# Patient Record
Sex: Male | Born: 1945
Health system: Southern US, Community
[De-identification: ages and names within clinical notes are randomized; demographics above are authoritative.]

## PROBLEM LIST (undated history)

## (undated) DIAGNOSIS — C189 Malignant neoplasm of colon, unspecified: Secondary | ICD-10-CM

## (undated) DIAGNOSIS — H409 Unspecified glaucoma: Secondary | ICD-10-CM

## (undated) DIAGNOSIS — I10 Essential (primary) hypertension: Secondary | ICD-10-CM

## (undated) DIAGNOSIS — R202 Paresthesia of skin: Secondary | ICD-10-CM

## (undated) DIAGNOSIS — M1990 Unspecified osteoarthritis, unspecified site: Secondary | ICD-10-CM

## (undated) DIAGNOSIS — C61 Malignant neoplasm of prostate: Secondary | ICD-10-CM

## (undated) DIAGNOSIS — G4730 Sleep apnea, unspecified: Secondary | ICD-10-CM

## (undated) DIAGNOSIS — R0602 Shortness of breath: Secondary | ICD-10-CM

## (undated) DIAGNOSIS — H269 Unspecified cataract: Secondary | ICD-10-CM

## (undated) DIAGNOSIS — J189 Pneumonia, unspecified organism: Secondary | ICD-10-CM

## (undated) DIAGNOSIS — C3490 Malignant neoplasm of unspecified part of unspecified bronchus or lung: Secondary | ICD-10-CM

## (undated) DIAGNOSIS — K219 Gastro-esophageal reflux disease without esophagitis: Secondary | ICD-10-CM

## (undated) DIAGNOSIS — M79609 Pain in unspecified limb: Secondary | ICD-10-CM

## (undated) DIAGNOSIS — J449 Chronic obstructive pulmonary disease, unspecified: Secondary | ICD-10-CM

## (undated) DIAGNOSIS — N189 Chronic kidney disease, unspecified: Secondary | ICD-10-CM

## (undated) DIAGNOSIS — C801 Malignant (primary) neoplasm, unspecified: Secondary | ICD-10-CM

## (undated) DIAGNOSIS — N529 Male erectile dysfunction, unspecified: Secondary | ICD-10-CM

## (undated) HISTORY — PX: ELBOW SURGERY: SHX618

## (undated) HISTORY — PX: BACK SURGERY: SHX140

## (undated) HISTORY — PX: EYE SURGERY: SHX253

---

## 1998-03-10 ENCOUNTER — Inpatient Hospital Stay (HOSPITAL_COMMUNITY): Admission: EM | Admit: 1998-03-10 | Discharge: 1998-03-12 | Payer: Self-pay | Admitting: Emergency Medicine

## 1998-07-02 ENCOUNTER — Encounter: Payer: Self-pay | Admitting: Ophthalmology

## 1998-07-03 ENCOUNTER — Inpatient Hospital Stay (HOSPITAL_COMMUNITY): Admission: RE | Admit: 1998-07-03 | Discharge: 1998-07-04 | Payer: Self-pay | Admitting: Ophthalmology

## 1999-02-14 HISTORY — PX: APPENDECTOMY: SHX54

## 2000-04-27 ENCOUNTER — Ambulatory Visit (HOSPITAL_COMMUNITY): Admission: RE | Admit: 2000-04-27 | Discharge: 2000-04-27 | Payer: Self-pay | Admitting: Ophthalmology

## 2000-04-27 ENCOUNTER — Encounter: Payer: Self-pay | Admitting: Ophthalmology

## 2002-11-14 ENCOUNTER — Ambulatory Visit (HOSPITAL_COMMUNITY): Admission: RE | Admit: 2002-11-14 | Discharge: 2002-11-14 | Payer: Self-pay | Admitting: Internal Medicine

## 2003-04-06 ENCOUNTER — Encounter (HOSPITAL_COMMUNITY): Admission: RE | Admit: 2003-04-06 | Discharge: 2003-05-06 | Payer: Self-pay | Admitting: Internal Medicine

## 2003-04-07 ENCOUNTER — Encounter: Payer: Self-pay | Admitting: Internal Medicine

## 2004-02-24 ENCOUNTER — Emergency Department (HOSPITAL_COMMUNITY): Admission: EM | Admit: 2004-02-24 | Discharge: 2004-02-24 | Payer: Self-pay | Admitting: Emergency Medicine

## 2004-03-08 ENCOUNTER — Inpatient Hospital Stay (HOSPITAL_BASED_OUTPATIENT_CLINIC_OR_DEPARTMENT_OTHER): Admission: RE | Admit: 2004-03-08 | Discharge: 2004-03-08 | Payer: Self-pay | Admitting: Cardiology

## 2004-04-26 ENCOUNTER — Ambulatory Visit (HOSPITAL_COMMUNITY): Admission: RE | Admit: 2004-04-26 | Discharge: 2004-04-26 | Payer: Self-pay | Admitting: Internal Medicine

## 2006-09-15 HISTORY — PX: CARDIAC CATHETERIZATION: SHX172

## 2007-12-11 ENCOUNTER — Ambulatory Visit (HOSPITAL_COMMUNITY): Admission: RE | Admit: 2007-12-11 | Discharge: 2007-12-11 | Payer: Self-pay | Admitting: Urology

## 2009-09-15 DIAGNOSIS — J189 Pneumonia, unspecified organism: Secondary | ICD-10-CM

## 2009-09-15 HISTORY — DX: Pneumonia, unspecified organism: J18.9

## 2010-02-20 ENCOUNTER — Ambulatory Visit (HOSPITAL_COMMUNITY): Admission: RE | Admit: 2010-02-20 | Discharge: 2010-02-20 | Payer: Self-pay | Admitting: Internal Medicine

## 2010-02-22 ENCOUNTER — Ambulatory Visit (HOSPITAL_COMMUNITY): Admission: RE | Admit: 2010-02-22 | Discharge: 2010-02-22 | Payer: Self-pay | Admitting: Internal Medicine

## 2010-02-27 ENCOUNTER — Ambulatory Visit: Payer: Self-pay | Admitting: Thoracic Surgery

## 2010-03-01 ENCOUNTER — Ambulatory Visit (HOSPITAL_COMMUNITY): Admission: RE | Admit: 2010-03-01 | Discharge: 2010-03-01 | Payer: Self-pay | Admitting: Thoracic Surgery

## 2010-03-01 ENCOUNTER — Ambulatory Visit: Payer: Self-pay | Admitting: Thoracic Surgery

## 2010-03-05 ENCOUNTER — Ambulatory Visit (HOSPITAL_COMMUNITY): Admission: RE | Admit: 2010-03-05 | Discharge: 2010-03-05 | Payer: Self-pay | Admitting: Thoracic Surgery

## 2010-03-06 ENCOUNTER — Ambulatory Visit: Payer: Self-pay | Admitting: Thoracic Surgery

## 2010-03-14 ENCOUNTER — Ambulatory Visit: Payer: Self-pay | Admitting: Cardiothoracic Surgery

## 2010-03-15 HISTORY — PX: OTHER SURGICAL HISTORY: SHX169

## 2010-03-20 ENCOUNTER — Encounter: Payer: Self-pay | Admitting: Cardiothoracic Surgery

## 2010-03-20 ENCOUNTER — Inpatient Hospital Stay (HOSPITAL_COMMUNITY): Admission: RE | Admit: 2010-03-20 | Discharge: 2010-03-26 | Payer: Self-pay | Admitting: Thoracic Surgery

## 2010-03-25 ENCOUNTER — Ambulatory Visit: Payer: Self-pay | Admitting: Cardiothoracic Surgery

## 2010-03-29 ENCOUNTER — Ambulatory Visit: Payer: Self-pay | Admitting: Cardiothoracic Surgery

## 2010-04-11 ENCOUNTER — Ambulatory Visit: Payer: Self-pay | Admitting: Cardiothoracic Surgery

## 2010-04-11 ENCOUNTER — Encounter: Admission: RE | Admit: 2010-04-11 | Discharge: 2010-04-11 | Payer: Self-pay | Admitting: Cardiothoracic Surgery

## 2010-04-12 ENCOUNTER — Ambulatory Visit: Payer: Self-pay | Admitting: Internal Medicine

## 2010-04-22 LAB — CBC WITH DIFFERENTIAL/PLATELET
EOS%: 2.7 % (ref 0.0–7.0)
LYMPH%: 34.6 % (ref 14.0–49.0)
MCH: 31.9 pg (ref 27.2–33.4)
MCV: 91.5 fL (ref 79.3–98.0)
MONO#: 0.7 10*3/uL (ref 0.1–0.9)
MONO%: 8.9 % (ref 0.0–14.0)
NEUT#: 4.3 10*3/uL (ref 1.5–6.5)
NEUT%: 53 % (ref 39.0–75.0)
WBC: 8.2 10*3/uL (ref 4.0–10.3)

## 2010-04-22 LAB — COMPREHENSIVE METABOLIC PANEL
AST: 23 U/L (ref 0–37)
Albumin: 4.6 g/dL (ref 3.5–5.2)
Alkaline Phosphatase: 94 U/L (ref 39–117)
BUN: 23 mg/dL (ref 6–23)
Calcium: 9.6 mg/dL (ref 8.4–10.5)
Chloride: 103 mEq/L (ref 96–112)
Potassium: 4.4 mEq/L (ref 3.5–5.3)
Sodium: 138 mEq/L (ref 135–145)
Total Bilirubin: 0.4 mg/dL (ref 0.3–1.2)

## 2010-07-11 ENCOUNTER — Encounter: Admission: RE | Admit: 2010-07-11 | Discharge: 2010-07-11 | Payer: Self-pay | Admitting: Cardiothoracic Surgery

## 2010-07-11 ENCOUNTER — Ambulatory Visit: Payer: Self-pay | Admitting: Cardiothoracic Surgery

## 2010-07-17 ENCOUNTER — Ambulatory Visit: Payer: Self-pay | Admitting: Internal Medicine

## 2010-07-19 ENCOUNTER — Ambulatory Visit (HOSPITAL_COMMUNITY): Admission: RE | Admit: 2010-07-19 | Discharge: 2010-07-19 | Payer: Self-pay | Admitting: Internal Medicine

## 2010-07-19 LAB — COMPREHENSIVE METABOLIC PANEL
ALT: 23 U/L (ref 0–53)
AST: 23 U/L (ref 0–37)
Alkaline Phosphatase: 87 U/L (ref 39–117)
BUN: 31 mg/dL — ABNORMAL HIGH (ref 6–23)
Calcium: 9.4 mg/dL (ref 8.4–10.5)
Chloride: 110 mEq/L (ref 96–112)
Creatinine, Ser: 1.09 mg/dL (ref 0.40–1.50)
Potassium: 4.5 mEq/L (ref 3.5–5.3)
Sodium: 141 mEq/L (ref 135–145)
Total Bilirubin: 0.3 mg/dL (ref 0.3–1.2)

## 2010-07-19 LAB — CBC WITH DIFFERENTIAL/PLATELET
LYMPH%: 35.3 % (ref 14.0–49.0)
MONO#: 0.7 10*3/uL (ref 0.1–0.9)
NEUT%: 48.6 % (ref 39.0–75.0)
Platelets: 324 10*3/uL (ref 140–400)
RBC: 4.15 10*6/uL — ABNORMAL LOW (ref 4.20–5.82)
RDW: 12.9 % (ref 11.0–14.6)
WBC: 6.7 10*3/uL (ref 4.0–10.3)

## 2010-08-15 HISTORY — PX: PROSTATECTOMY: SHX69

## 2010-09-12 ENCOUNTER — Inpatient Hospital Stay (HOSPITAL_COMMUNITY)
Admission: RE | Admit: 2010-09-12 | Discharge: 2010-09-13 | Payer: Self-pay | Source: Home / Self Care | Attending: Urology | Admitting: Urology

## 2010-09-12 ENCOUNTER — Encounter: Payer: Self-pay | Admitting: Urology

## 2010-10-04 ENCOUNTER — Ambulatory Visit
Admission: RE | Admit: 2010-10-04 | Discharge: 2010-10-04 | Payer: Self-pay | Source: Home / Self Care | Attending: Cardiothoracic Surgery | Admitting: Cardiothoracic Surgery

## 2010-10-04 ENCOUNTER — Encounter
Admission: RE | Admit: 2010-10-04 | Discharge: 2010-10-04 | Payer: Self-pay | Source: Home / Self Care | Attending: Cardiothoracic Surgery | Admitting: Cardiothoracic Surgery

## 2010-10-04 ENCOUNTER — Other Ambulatory Visit: Payer: Self-pay | Admitting: Internal Medicine

## 2010-10-04 DIAGNOSIS — C3490 Malignant neoplasm of unspecified part of unspecified bronchus or lung: Secondary | ICD-10-CM

## 2010-10-05 NOTE — Assessment & Plan Note (Signed)
OFFICE VISIT  Dutt, Tajee T DOB:  04-14-1946                                        October 04, 2010 CHART #:  [id number]  HISTORY:  The patient returns to the office today in followup after a left video-assisted thoracoscopy and left upper lobectomy for a squamous cell carcinoma.  At the time of surgery, it was a T2N0M0 carcinoma of the left upper lobe.  He notes that he has been doing very well.  Since last seen, he has underwent prostatectomy for carcinoma of the prostate, biopsy proven, interesting that his PET scan done 6 months ago did not show any activity in the prostate; this was done 3 weeks ago.  He notes that he is doing well and just started driving this week.  He has had no further pulmonary problems.  PHYSICAL EXAMINATION:  His blood pressure 149/91, pulse 84, respiratory rate 20, and O2 sats 96%.  His left chest tube sites incisions are well healed.  His lungs are clear bilaterally.  DIAGNOSTIC TESTS:  Followup chest x-ray shows clear lung fields bilaterally.  I have reviewed the findings of a CT scan done by Dr. Arbutus Ped last month.  PLAN:  He has a followup scan plan for April of this year.  I plan to see him back in approximately 6 months.  He is to see Dr. Arbutus Ped in April after the CT scan.  Sheliah Plane, MD Electronically Signed  EG/MEDQ  D:  10/04/2010  T:  10/05/2010  Job:  [id number]  cc:   Kingsley Callander. Ouida Sills, MD Lajuana Matte, MD

## 2010-10-16 ENCOUNTER — Encounter: Payer: Self-pay | Admitting: Cardiothoracic Surgery

## 2010-11-25 LAB — TYPE AND SCREEN
ABO/RH(D): O POS
Antibody Screen: NEGATIVE

## 2010-11-25 LAB — BASIC METABOLIC PANEL
BUN: 29 mg/dL — ABNORMAL HIGH (ref 6–23)
CO2: 24 mEq/L (ref 19–32)
Chloride: 109 mEq/L (ref 96–112)
Creatinine, Ser: 1 mg/dL (ref 0.4–1.5)
GFR calc Af Amer: >60 mL/min (ref >60)
Glucose, Bld: 97 mg/dL (ref 70–99)
Potassium: 4.4 mEq/L (ref 3.5–5.1)
Sodium: 141 mEq/L (ref 135–145)

## 2010-11-25 LAB — HEMOGLOBIN AND HEMATOCRIT, BLOOD
HCT: 35.5 % — ABNORMAL LOW (ref 39.0–52.0)
Hemoglobin: 12.1 g/dL — ABNORMAL LOW (ref 13.0–17.0)

## 2010-11-25 LAB — ABO/RH: ABO/RH(D): O POS

## 2010-11-25 LAB — CBC
MCHC: 34.7 g/dL (ref 30.0–36.0)
Platelets: 303 10*3/uL (ref 150–400)
RBC: 4.37 MIL/uL (ref 4.22–5.81)
RDW: 13 % (ref 11.5–15.5)
WBC: 6.1 10*3/uL (ref 4.0–10.5)

## 2010-12-01 LAB — COMPREHENSIVE METABOLIC PANEL
ALT: 19 U/L (ref 0–53)
AST: 21 U/L (ref 0–37)
Albumin: 2.4 g/dL — ABNORMAL LOW (ref 3.5–5.2)
Alkaline Phosphatase: 104 U/L (ref 39–117)
Alkaline Phosphatase: 53 U/L (ref 39–117)
BUN: 31 mg/dL — ABNORMAL HIGH (ref 6–23)
BUN: <1 mg/dL — ABNORMAL LOW (ref 6–23)
CO2: 32 mEq/L (ref 19–32)
CO2: 25 mEq/L (ref 19–32)
Calcium: 8 mg/dL — ABNORMAL LOW (ref 8.4–10.5)
Calcium: 9.6 mg/dL (ref 8.4–10.5)
Chloride: 109 mEq/L (ref 96–112)
Chloride: 97 mEq/L (ref 96–112)
Creatinine, Ser: 0.46 mg/dL (ref 0.4–1.5)
Creatinine, Ser: 1.06 mg/dL (ref 0.4–1.5)
GFR calc Af Amer: >60 mL/min (ref >60)
GFR calc non Af Amer: >60 mL/min (ref >60)
GFR calc non Af Amer: >60 mL/min (ref >60)
GFR calc non Af Amer: >60 mL/min (ref >60)
Glucose, Bld: 104 mg/dL — ABNORMAL HIGH (ref 70–99)
Glucose, Bld: 99 mg/dL (ref 70–99)
Glucose, Bld: 94 mg/dL (ref 70–99)
Potassium: 3.9 mEq/L (ref 3.5–5.1)
Potassium: 4.4 mEq/L (ref 3.5–5.1)
Sodium: 135 mEq/L (ref 135–145)
Sodium: 140 mEq/L (ref 135–145)
Total Bilirubin: 0.2 mg/dL — ABNORMAL LOW (ref 0.3–1.2)
Total Bilirubin: 0.5 mg/dL (ref 0.3–1.2)
Total Protein: 5 g/dL — ABNORMAL LOW (ref 6.0–8.3)
Total Protein: 7.5 g/dL (ref 6.0–8.3)

## 2010-12-01 LAB — CBC
HCT: 23.5 % — ABNORMAL LOW (ref 39.0–52.0)
HCT: 32.8 % — ABNORMAL LOW (ref 39.0–52.0)
HCT: 33.5 % — ABNORMAL LOW (ref 39.0–52.0)
HCT: 40.2 % (ref 39.0–52.0)
Hemoglobin: 11.1 g/dL — ABNORMAL LOW (ref 13.0–17.0)
Hemoglobin: 11.6 g/dL — ABNORMAL LOW (ref 13.0–17.0)
Hemoglobin: 8 g/dL — ABNORMAL LOW (ref 13.0–17.0)
Hemoglobin: 11.7 g/dL — ABNORMAL LOW (ref 13.0–17.0)
MCH: 31.6 pg (ref 26.0–34.0)
MCH: 31.9 pg (ref 26.0–34.0)
MCH: 33.2 pg (ref 26.0–34.0)
MCHC: 33.9 g/dL (ref 30.0–36.0)
MCHC: 34.5 g/dL (ref 30.0–36.0)
MCHC: 34.6 g/dL (ref 30.0–36.0)
MCHC: 34.7 g/dL (ref 30.0–36.0)
MCV: 91.9 fL (ref 78.0–100.0)
MCV: 92.3 fL (ref 78.0–100.0)
MCV: 93.2 fL (ref 78.0–100.0)
MCV: 95.8 fL (ref 78.0–100.0)
MCV: 91.9 fL (ref 78.0–100.0)
Platelets: 166 10*3/uL (ref 150–400)
Platelets: 180 10*3/uL (ref 150–400)
Platelets: 188 10*3/uL (ref 150–400)
RBC: 2.46 MIL/uL — ABNORMAL LOW (ref 4.22–5.81)
RBC: 3.52 MIL/uL — ABNORMAL LOW (ref 4.22–5.81)
RBC: 3.63 MIL/uL — ABNORMAL LOW (ref 4.22–5.81)
RBC: 3.66 MIL/uL — ABNORMAL LOW (ref 4.22–5.81)
RDW: 12.2 % (ref 11.5–15.5)
RDW: 15.1 % (ref 11.5–15.5)
RDW: 15.2 % (ref 11.5–15.5)
RDW: 15.3 % (ref 11.5–15.5)
RDW: 13.9 % (ref 11.5–15.5)
WBC: 10 10*3/uL (ref 4.0–10.5)
WBC: 14.1 10*3/uL — ABNORMAL HIGH (ref 4.0–10.5)
WBC: 16 10*3/uL — ABNORMAL HIGH (ref 4.0–10.5)
WBC: 9.2 10*3/uL (ref 4.0–10.5)

## 2010-12-01 LAB — TYPE AND SCREEN
ABO/RH(D): O POS
Antibody Screen: NEGATIVE

## 2010-12-01 LAB — BASIC METABOLIC PANEL
BUN: 15 mg/dL (ref 6–23)
BUN: 19 mg/dL (ref 6–23)
CO2: 23 mEq/L (ref 19–32)
CO2: 27 mEq/L (ref 19–32)
Calcium: 8.1 mg/dL — ABNORMAL LOW (ref 8.4–10.5)
Calcium: 8.7 mg/dL (ref 8.4–10.5)
Chloride: 101 mEq/L (ref 96–112)
Chloride: 99 mEq/L (ref 96–112)
Creatinine, Ser: 0.88 mg/dL (ref 0.4–1.5)
Creatinine, Ser: 0.91 mg/dL (ref 0.4–1.5)
GFR calc Af Amer: >60 mL/min (ref >60)
GFR calc Af Amer: >60 mL/min (ref >60)
GFR calc non Af Amer: >60 mL/min (ref >60)
GFR calc non Af Amer: >60 mL/min (ref >60)
Glucose, Bld: 116 mg/dL — ABNORMAL HIGH (ref 70–99)
Glucose, Bld: 124 mg/dL — ABNORMAL HIGH (ref 70–99)
Potassium: 4 mEq/L (ref 3.5–5.1)
Potassium: 4.1 mEq/L (ref 3.5–5.1)
Sodium: 132 mEq/L — ABNORMAL LOW (ref 135–145)
Sodium: 133 mEq/L — ABNORMAL LOW (ref 135–145)

## 2010-12-01 LAB — POCT I-STAT 3, ART BLOOD GAS (G3+)
Bicarbonate: 23.3 mEq/L (ref 20.0–24.0)
Patient temperature: 98.5
TCO2: 24 mmol/L (ref 0–100)
pH, Arterial: 7.395 (ref 7.350–7.450)
pO2, Arterial: 69 mmHg — ABNORMAL LOW (ref 80.0–100.0)

## 2010-12-01 LAB — AFB CULTURE WITH SMEAR (NOT AT ARMC)

## 2010-12-01 LAB — CULTURE, RESPIRATORY W GRAM STAIN: Culture: NO GROWTH

## 2010-12-01 LAB — BLOOD GAS, ARTERIAL
Bicarbonate: 19.3 mEq/L — ABNORMAL LOW (ref 20.0–24.0)
Patient temperature: 98.6
TCO2: 20.2 mmol/L (ref 0–100)
pH, Arterial: 7.433 (ref 7.350–7.450)

## 2010-12-01 LAB — GLUCOSE, CAPILLARY
Glucose-Capillary: 114 mg/dL — ABNORMAL HIGH (ref 70–99)
Glucose-Capillary: 104 mg/dL — ABNORMAL HIGH (ref 70–99)

## 2010-12-01 LAB — FUNGUS CULTURE W SMEAR

## 2010-12-01 LAB — URINALYSIS, ROUTINE W REFLEX MICROSCOPIC
Glucose, UA: NEGATIVE mg/dL
Hgb urine dipstick: NEGATIVE
Specific Gravity, Urine: 1.011 (ref 1.005–1.030)
Urobilinogen, UA: 0.2 mg/dL (ref 0.0–1.0)

## 2010-12-01 LAB — PROTIME-INR
INR: 0.94 (ref 0.00–1.49)
INR: 1 (ref 0.00–1.49)
Prothrombin Time: 12.5 seconds (ref 11.6–15.2)
Prothrombin Time: 13.1 seconds (ref 11.6–15.2)

## 2010-12-01 LAB — APTT: aPTT: 30 seconds (ref 24–37)

## 2011-01-17 ENCOUNTER — Other Ambulatory Visit: Payer: Self-pay | Admitting: Internal Medicine

## 2011-01-17 ENCOUNTER — Encounter (HOSPITAL_BASED_OUTPATIENT_CLINIC_OR_DEPARTMENT_OTHER): Payer: BC Managed Care – PPO | Admitting: Internal Medicine

## 2011-01-17 ENCOUNTER — Encounter (HOSPITAL_COMMUNITY): Payer: Self-pay

## 2011-01-17 ENCOUNTER — Ambulatory Visit (HOSPITAL_COMMUNITY)
Admission: RE | Admit: 2011-01-17 | Discharge: 2011-01-17 | Disposition: A | Discharge status: Home / Self Care | Payer: BC Managed Care – PPO | Source: Ambulatory Visit | Attending: Internal Medicine | Admitting: Internal Medicine

## 2011-01-17 DIAGNOSIS — C3410 Malignant neoplasm of upper lobe, unspecified bronchus or lung: Secondary | ICD-10-CM

## 2011-01-17 DIAGNOSIS — Z8546 Personal history of malignant neoplasm of prostate: Secondary | ICD-10-CM | POA: Insufficient documentation

## 2011-01-17 DIAGNOSIS — Z902 Acquired absence of lung [part of]: Secondary | ICD-10-CM | POA: Insufficient documentation

## 2011-01-17 DIAGNOSIS — C3490 Malignant neoplasm of unspecified part of unspecified bronchus or lung: Secondary | ICD-10-CM

## 2011-01-17 DIAGNOSIS — I2510 Atherosclerotic heart disease of native coronary artery without angina pectoris: Secondary | ICD-10-CM | POA: Insufficient documentation

## 2011-01-17 DIAGNOSIS — J984 Other disorders of lung: Secondary | ICD-10-CM | POA: Insufficient documentation

## 2011-01-17 DIAGNOSIS — Z9079 Acquired absence of other genital organ(s): Secondary | ICD-10-CM | POA: Insufficient documentation

## 2011-01-17 HISTORY — DX: Malignant neoplasm of prostate: C61

## 2011-01-17 HISTORY — DX: Essential (primary) hypertension: I10

## 2011-01-17 HISTORY — DX: Malignant neoplasm of unspecified part of unspecified bronchus or lung: C34.90

## 2011-01-17 LAB — CMP (CANCER CENTER ONLY)
ALT(SGPT): 25 U/L (ref 10–47)
Albumin: 3.9 g/dL (ref 3.3–5.5)
CO2: 23 mEq/L (ref 18–33)
Calcium: 9.2 mg/dL (ref 8.0–10.3)
Chloride: 106 mEq/L (ref 98–108)
Glucose, Bld: 112 mg/dL (ref 73–118)
Sodium: 143 mEq/L (ref 128–145)
Total Protein: 7.5 g/dL (ref 6.4–8.1)

## 2011-01-17 LAB — CBC WITH DIFFERENTIAL/PLATELET
BASO%: 0.7 % (ref 0.0–2.0)
Eosinophils Absolute: 0.2 10*3/uL (ref 0.0–0.5)
HCT: 37.6 % — ABNORMAL LOW (ref 38.4–49.9)
MCHC: 34.8 g/dL (ref 32.0–36.0)
MONO#: 0.6 10*3/uL (ref 0.1–0.9)
NEUT#: 3.8 10*3/uL (ref 1.5–6.5)
Platelets: 280 10*3/uL (ref 140–400)
RBC: 4.1 10*6/uL — ABNORMAL LOW (ref 4.20–5.82)
WBC: 6.7 10*3/uL (ref 4.0–10.3)
lymph#: 2.1 10*3/uL (ref 0.9–3.3)

## 2011-01-17 MED ORDER — IOHEXOL 300 MG/ML  SOLN
80.0000 mL | Freq: Once | INTRAMUSCULAR | Status: AC | PRN
  Administered 2011-01-17: 80 mL via INTRAVENOUS

## 2011-01-21 ENCOUNTER — Other Ambulatory Visit: Payer: Self-pay | Admitting: Internal Medicine

## 2011-01-21 ENCOUNTER — Encounter (HOSPITAL_BASED_OUTPATIENT_CLINIC_OR_DEPARTMENT_OTHER): Payer: BC Managed Care – PPO | Admitting: Internal Medicine

## 2011-01-21 DIAGNOSIS — I10 Essential (primary) hypertension: Secondary | ICD-10-CM

## 2011-01-21 DIAGNOSIS — C3490 Malignant neoplasm of unspecified part of unspecified bronchus or lung: Secondary | ICD-10-CM

## 2011-01-21 DIAGNOSIS — C3410 Malignant neoplasm of upper lobe, unspecified bronchus or lung: Secondary | ICD-10-CM

## 2011-01-21 DIAGNOSIS — C6990 Malignant neoplasm of unspecified site of unspecified eye: Secondary | ICD-10-CM

## 2011-01-21 DIAGNOSIS — Z85118 Personal history of other malignant neoplasm of bronchus and lung: Secondary | ICD-10-CM

## 2011-01-27 ENCOUNTER — Encounter (HOSPITAL_COMMUNITY)
Admission: RE | Admit: 2011-01-27 | Discharge: 2011-01-27 | Disposition: A | Discharge status: Home / Self Care | Payer: BC Managed Care – PPO | Source: Ambulatory Visit | Attending: Internal Medicine | Admitting: Internal Medicine

## 2011-01-27 DIAGNOSIS — J984 Other disorders of lung: Secondary | ICD-10-CM | POA: Insufficient documentation

## 2011-01-27 DIAGNOSIS — C3490 Malignant neoplasm of unspecified part of unspecified bronchus or lung: Secondary | ICD-10-CM | POA: Insufficient documentation

## 2011-01-27 DIAGNOSIS — Z85118 Personal history of other malignant neoplasm of bronchus and lung: Secondary | ICD-10-CM

## 2011-01-27 LAB — GLUCOSE, CAPILLARY: Glucose-Capillary: 97 mg/dL (ref 70–99)

## 2011-01-27 MED ORDER — FLUDEOXYGLUCOSE F - 18 (FDG) INJECTION: 14.6000 | Freq: Once | INTRAVENOUS | Status: AC | PRN

## 2011-01-28 ENCOUNTER — Other Ambulatory Visit: Payer: Self-pay | Admitting: Internal Medicine

## 2011-01-28 ENCOUNTER — Encounter (HOSPITAL_BASED_OUTPATIENT_CLINIC_OR_DEPARTMENT_OTHER): Payer: BC Managed Care – PPO | Admitting: Internal Medicine

## 2011-01-28 DIAGNOSIS — C3490 Malignant neoplasm of unspecified part of unspecified bronchus or lung: Secondary | ICD-10-CM

## 2011-01-28 DIAGNOSIS — C3410 Malignant neoplasm of upper lobe, unspecified bronchus or lung: Secondary | ICD-10-CM

## 2011-01-28 DIAGNOSIS — I10 Essential (primary) hypertension: Secondary | ICD-10-CM

## 2011-01-28 DIAGNOSIS — C6990 Malignant neoplasm of unspecified site of unspecified eye: Secondary | ICD-10-CM

## 2011-01-28 NOTE — Letter (Signed)
March 06, 2010   Kingsley Callander. Ouida Sills, MD  [address]  [place], [place] [address]   Re:  [name] Short, [name] Short                 DOB:  07-Apr-1946   Dear Channing Mutters,   I saw the patient in the office today after we done a PET scan, CT scan  of the brain, bronchoscopy, and pulmonary function tests.  His pulmonary  function test was satisfactory for surgery.  His brain scan was negative  for metastatic disease.  His PET scan was positive in his left lingular  lesion.  The biopsies revealed squamous cell cancer or non-small cell  lung cancer.  I have recommended that he get a left upper lobectomy and  if that can be done, I do not think we can definitely remove it with a  left pneumonectomy.  I have explained the risk of both the lobectomy and  the pneumonectomy to him and he and his family agrees to the surgery.  We will do this on March 15, 2010, at [facility].   Ines Bloomer, M.D.  Electronically Signed   DPB/MEDQ  D:  03/06/2010  Short:  03/07/2010  Job:  [id number]

## 2011-01-28 NOTE — Assessment & Plan Note (Signed)
OFFICE VISIT   [name] Short, [name] Short                                        April 11, 2010  CHART #:  [id number]   HISTORY:  The patient returns to the office today in followup after his  recent left mini thoracotomy and left upper lobectomy with lymph node  dissection.  At the time of surgery, he was found to have a moderately  differentiated squamous cell carcinoma of the lung with negative nodes.  The tumor itself was 2.3 cm in gross size.  At the time of surgery, the  mass appeared to be between 2 and 3 centimeters from the distal to the  carina, staging as a pT2 N0 M0 carcinoma of the lung.  The patient did  extremely well postoperatively.  He is active without any shortness of  breath.  His wounds have healed well.   PHYSICAL EXAMINATION:  His blood pressure 122/80, pulse 56, respiratory  rate 16, and O2 sats 98% on room air.  His lungs are clear bilaterally.  The left chest incisions are all healing well.   DIAGNOSTIC TESTS:  Followup chest x-ray shows clear lung fields  bilaterally with good inflation.  There is no significant air space or  pneumothorax.   MEDICATIONS:  The patient continues on Alphagan eye drops.  He has  resumed his Cozaar.  He continues on nifedipine and losartan.  He was  discharged home on Lopressor 25 twice a day.  He is interested in  getting off this.  He will continue another week and then decrease it to  once a day for a week and then discontinue it.   IMPRESSION:  Overall, I am very pleased with the patient's progress.  We  have made him referral appointment for his case to be reviewed by  Medical Oncology.  The office will make  him an appointment for followup with Dr. Arbutus Ped.  I will plan to see  him back again in 3 months with a followup chest x-ray.   Sheliah Plane, MD  Electronically Signed   EG/MEDQ  D:  04/11/2010  Short:  04/12/2010  Job:  [id number]   cc:   Kingsley Callander. Ouida Sills, MD

## 2011-01-28 NOTE — Letter (Signed)
February 27, 2010   [name] Short. Ouida Sills, MD  [address]  [place], [place] [address]   Re:  [name] Short, [name] Short                 DOB:  20-Jan-1946   Dear Dr. Ouida Sills:   I appreciate the opportunity of seeing the patient.  This 65 year old  patient was treated for pneumonia and then a followup CT scan showed a  large left upper lobe mass with hilar and mediastinal adenopathy.  He  also had some fractures of his right posterior tenth ribs and has a  question of a sclerotic kyphosis at T7.  He has had no hemoptysis.  He  has had some fever with temps of 102 and was treated with Avelox.  He  quit smoking in December 2009.   PAST MEDICAL HISTORY:  Significant for hypertension.   MEDICATIONS:  Cozaar and Procardia.   ALLERGIES:  As mentioned, there are no allergies.   FAMILY HISTORY:  Noncontributory.   SOCIAL HISTORY:  He is married.  He has 3 children.  He works as an  Personnel officer.  Quit smoking in December 2009.   REVIEW OF SYSTEMS:  CONSTITUTIONAL:  He is 163 pounds.  He is 5 feet 7  inches.  GENERAL:  His weight has been stable.  CARDIAC:  No angina or atrial fibrillation.  PULMONARY:  No bronchitis or asthma or wheezing.  GI:  Reflux.  GU:  No kidney disease, dysuria, or frequent urination.  VASCULAR:  No claudication, DVT, TIAs.  NEUROLOGIC:  No dizziness, headaches, blackouts, or seizures.  MUSCULOSKELETAL:  Arthritis and joint pain.  PSYCHIATRIC:  No psychiatric illness.  EYE/ENT:  No change in his eyesight or hearing.  HEMATOLOGIC:  No problems with bleeding, clotting disorders, or anemia.   PHYSICAL EXAMINATION:  General:  He is a well-developed Caucasian male.  Vital Signs:  Blood pressure 167/92, pulse 64, respirations 18, sats  were 98%.  Head, Eyes, Ears, Nose, and Throat:  Unremarkable.  Neck:  Supple without thyromegaly.  Chest:  Clear to auscultation and  percussion.  Heart:  Regular sinus rhythm.  No murmurs.  Abdomen:  Soft.  There is no splenomegaly.  Extremities:   Pulses are 2+.  There is no  clubbing or edema.  Neurologic:  He is oriented x3.  Sensory and motor  intact.  Cranial nerves intact.   ASSESSMENT:  I feel that he probably has a stage IIIA non-small cell  lung cancer, it could possibly be small cell lung cancer.   PLAN:  Do a bronchoscopy with endobronchial ultrasound, possible  mediastinoscopy on March 01, 2010.   I appreciate the opportunity of seeing the patient.   Sincerely,   [name] Short, M.D.  Electronically Signed   DPB/MEDQ  D:  02/27/2010  Short:  02/28/2010  Job:  [id number]

## 2011-01-28 NOTE — Assessment & Plan Note (Signed)
OFFICE VISIT   Cohron, [name] Short  DOB:  1946/05/19                                        July 11, 2010  CHART #:  [id number]   HISTORY:  The patient returns to the office today in followup after his  resection of a squamous cell carcinoma, moderately differentiated, 2.3  cm in size with negative nodes, ultimately staged pathologically as a  T2N0M0 carcinoma of the left lung with left upper lobectomy done March 20, 2010.  Postoperatively, the patient has done very well.  He notes only  very slight shortness of breath with extreme exertion.  He has now  returned to full-time work that is fairly physically demanding.  He has  had no hemoptysis.  He notes that he is up-to-date on his flu  vaccination.  He has not had a pneumococcal vaccination.  He is  currently 16.   PHYSICAL EXAMINATION:  His blood pressure is 159/93, pulse is 61,  respirations 18, and O2 sats 98%.  His lungs are clear bilaterally.  I  do not appreciate any cervical or supraclavicular adenopathy or axillary  adenopathy.  His cardiac exam reveals regular rate and rhythm without  murmur or gallop.  His chest tube sites were all well healed without any  palpable masses.  His abdominal exam is benign.  I do not appreciate any  organomegaly or enlarged aorta.  He has no calf tenderness in his lower  extremities.   DIAGNOSTIC TESTS:  His chest x-ray today shows postoperative changes,  but otherwise clear.   IMPRESSION:  Overall, he appears to be making very good progress, now  about 3 months postop.  He has an appointment to see Dr. Arbutus Ped next  week for a followup CT scan of the  chest.  I have made him an appointment to see me in 3 months to stagger  visits to me and Dr. Arbutus Ped every 6 months.  Overall, I am very pleased  with his progress.   Sheliah Plane, MD  Electronically Signed   EG/MEDQ  D:  07/11/2010  Short:  07/11/2010  Job:  [id number]   cc:   Lajuana Matte, MD  Kingsley Callander. Ouida Sills,  MD

## 2011-01-31 NOTE — Op Note (Signed)
   NAME:  [name] Short, [name] Short                           ACCOUNT NO.:  [id number]   MEDICAL RECORD NO.:  [id number]                   PATIENT TYPE:  AMB   LOCATION:  DAY                                  FACILITY:  [facility]   PHYSICIAN:  Lionel December, M.D.                 DATE OF BIRTH:  1945-12-28   DATE OF PROCEDURE:  11/14/2002  DATE OF DISCHARGE:                                 OPERATIVE REPORT   PROCEDURE:  Total colonoscopy.   INDICATIONS FOR PROCEDURE:  The patient is a 65 year old Caucasian male with  a history of colonic polyps.  His last exam was in 08/1997.  He is presently  asymptomatic.  Family history is negative for colorectal carcinoma.  The  procedure was reviewed with the patient, and informed consent was obtained.   PREOPERATIVE MEDICATIONS:  Demerol 25 mg IV, Versed 4 mg IV in divided  doses.   INSTRUMENT USED:  The Olympus video system.   FINDINGS:  The procedure was performed in the endoscopy suite.  The  patient's vital signs and O2 saturations were monitored during the procedure  and remained stable.  The patient was placed in the left lateral position  and rectal examination performed.  No abnormality noted on external or  digital exam.  The scope was placed in the rectum and advanced to the region  of the sigmoid colon and beyond.  The preparation was excellent.  The scope  was passed to the cecum which was identified by the appendiceal orifice and  ileocecal valve.  Pictures were taken for the record.  As the scope was  withdrawn, the colonic mucosa was carefully examined and was normal.  There  was a single tiny polyp at the distal sigmoid colon which was ablated by  cold biopsy.  The rectal mucosa was normal.  The scope was retroflexed to  examine the anorectal junction, and small hemorrhoids were noted below the  dentate line.  The endoscope was straightened and withdrawn.  The patient  tolerated the procedure well.   FINAL DIAGNOSES:  1. Single small polyp at  sigmoid colon that was ablated by cold biopsy.  2. Small external hemorrhoids.    RECOMMENDATIONS:  Standard instructions given.  He should return for a  followup exam in five years from now.  I will contact the patient with the  biopsy results later this week.                                               Lionel December, M.D.    NR/MEDQ  D:  11/14/2002  T:  11/14/2002  Job:  [id number]   cc:   Kingsley Callander. Ouida Sills, M.D.  [address]  [place]  [place] [address]  Fax: [phone]

## 2011-01-31 NOTE — Op Note (Signed)
[facility]  Patient:    DAVIDSON, PALMIERI                          MRN: [id number] Proc. Date: 04/27/00 Adm. Date:  [id number] Attending:  Ernesto Rutherford CC:         Dr. Rob Bunting, [place], [place]   Operative Report  PREOPERATIVE DIAGNOSES: 1. Secondary glaucoma of the right eye. 2. Retained posterior ocular implant to the right eye - silicone oil from    previous vitrectomy and repair of complex retinal detachment. 3. History of chronic retinal detachment.  POSTOPERATIVE DIAGNOSES: 1. Secondary glaucoma of the right eye. 2. Retained posterior ocular implant to the right eye - silicone oil from    previous vitrectomy and repair of complex retinal detachment. 3. History of chronic retinal detachment. 4. 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  Although, attempts were made to identify an    overlying epiretinal membrane.  It was quite clear the retinal folds are    actually inherent and even in the retina, as well as, subretinal.  PROCEDURE: 1. Posterior vitrectomy with attempted membrane peel of the right eye. 2. Removal of ocular implant - silicone oil - posteriorly from the right eye.  FINDINGS:  Retinal ______  360 degrees and this was maintained.  SURGEON:  Ernesto Rutherford, M.D.  ANESTHESIA:  General endotracheal anesthesia.  INDICATIONS:  Patient is a 65 year old man with chronic retinal detachment to the right eye with prolonged retention of an ocular implant - silicone oil to maintain retinal reattachment state as the patient had significant submacular scarring and hemorrhage from his initial disorder, as well as, its repair.  Currently, he has suffered corneal decompensation secondary to glaucoma of the right eye.  This is an attempt to remove silicone oil to slow further progression of corneal decompensation in this poorly sighted eye, which is now "a spare tire."  Patient  understands the risk of anesthesia, including the rare occurrence of death, loss to the eye, including hemorrhage, infection, scarring, need for further surgery, no change in vision, loss of vision, progression of disease despite intervention.  Appropriate signed consent was obtained.  DESCRIPTION OF PROCEDURE:  Patient taken to the operating room.  In the operating room, general endotracheal anesthesia was instituted without difficulty.  Right periocular region sterilely prepped in usual ophthalmic fashion.  Lid speculum applied.  Conjunctival peritomies fashioned in each of three quadrants with the exception of the inferonasal.  A 4 mm infusion was secured 3.5 mm posterior to the limbus in the inferotemporal quadrant. Placement in the vitreous cavity verified visually.   Superior sclerotomies then fashioned.  Wild microscope placed in position with Biom attachment. Core vitrectomy was not necessary and vitreous fluid extractor by Dorc was placed into the vitreous cavity to remove the silicone oil.  This was carried out without difficulty.  Smaller bubbles were removed with vitrectomy instrumentation.  Careful inspection of the macular failed to disclose overlying membrane, which were inducing retinal folds and the appearance of subretinal fibrosis was seen.  Slight corneal opacification did limit this process at this time however.  No other retinal holes or tears were noted. Retina was nicely attached and maintained its attached throughout the case. Superior sclerotomies were closed with 7-0 Vicryl suture.  The infusion removed and similarly closed with 7-0 Vicryl suture.  Globe pressure was adjusted with BSS  injected through paracentesis.  Conjunctiva closed with 7-0 Vicryl suture.  Subconjunctival injection of antibiotics and steroid applied. Patient tolerated the procedure well without complications, awakened from anesthesia. DD:  04/27/00 TD:  04/27/00 Job: [id number]

## 2011-01-31 NOTE — Cardiovascular Report (Signed)
NAME:  [name] Short, [name] Short NO.:  [id number]   MEDICAL RECORD NO.:  [id number]                   PATIENT TYPE:  OIB   LOCATION:  6501                                 FACILITY:  [facility]   PHYSICIAN:  Salvadore Farber, M.D. [facility]         DATE OF BIRTH:  1945-11-19   DATE OF PROCEDURE:  03/08/2004  DATE OF DISCHARGE:  03/08/2004                              CARDIAC CATHETERIZATION   PROCEDURES:  Left heart catheterization, left ventriculography, coronary  angiography.   INDICATION:  Mr. Trompeter is a 65 year old gentleman without prior history of  cardiovascular disease  He has had three episodes of syncope accompanied by  chest discomfort.  Previous Cardiolite reading was equivocal, and he is  referred for diagnostic angiography to exclude significant coronary  stenosis.   OPERATOR:  Salvadore Farber, M.D.   PROCEDURAL TECHNIQUE:  Informed consent was obtained.  Under 1% lidocaine  local anesthesia, a 4-French sheath was placed in the right common femoral  artery using the modified Seldinger technique.  Diagnostic angiography and  ventriculography were performed using JL-4, JR-4, and pigtail catheters.  The patient tolerated the procedure well and was transferred to the holding  room in stable condition.  Sheaths will be removed there.   COMPLICATIONS:  None.   FINDINGS:  1. LV:  110/0/6.  EF 60% without regional wall motion abnormality.  2. No aortic stenosis or mitral regurgitation.  3. Left main:  There is some calcium on the superior wall of the vessel, but     there is no evidence of stenosis.  4. LAD:  Mild calcification of the proximal vessel without stenosis.  This     is a large vessel wrapping the apex of the heart to supply a substantial     portion of the inferior wall.  It gives rise to a single large diagonal     branch.  The proximal vessel is calcified, but there is no stenosis.  5. Circumflex:  Moderate size vessel giving rise to two obtuse  marginals.     This is angiographically normal.  6. RCA:  Large, dominant vessel.  It is angiographically normal.   IMPRESSION/RECOMMENDATIONS:  1. Calcified vasculature without stenosis.  2. Normal left ventricular size and systolic function.  3. No aortic stenosis or mitral regurgitation.   There is no evidence of ischemia causing lesions to explain his syncope and  chest pain.                                               Salvadore Farber, M.D. [facility]    WED/MEDQ  D:  03/08/2004  T:  03/09/2004  Job:  [id number]   cc:   Kingsley Callander. Ouida Sills, M.D.  [address]  [place]  [place] [address]  Fax: [phone]  Vida Roller, M.D.  Fax: [phone]

## 2011-01-31 NOTE — Op Note (Signed)
NAME:  [name] Short, SCRIPTER NO.:  [id number]   MEDICAL RECORD NO.:  [id number]                   PATIENT TYPE:  OIB   LOCATION:  2899                                 FACILITY:  [facility]   PHYSICIAN:  Doylene Canning. Ladona Ridgel, M.D.               DATE OF BIRTH:  20-May-1946   DATE OF PROCEDURE:  04/26/2004  DATE OF DISCHARGE:  04/26/2004                                 OPERATIVE REPORT   PROCEDURE PERFORMED:  Head-up tilt table testing.   INDICATIONS:  Unexplained syncope.   INTRODUCTION:  The patient is a 65 year old man who has a history of  recurrent dizzy spells as well as frank syncope.  He underwent  catheterization demonstrating preserved LV systolic function and no  obstructive coronary disease.  He is now referred for head-up tilt table  testing.   PROCEDURE:  After informed consent was obtained, the patient was taken to  the diagnostic catheterization lab in the fasting state.  After the usual  preparation he was placed in the supine position.  His initial blood  pressure was in the 120 range, with a pulse in the mid to low 50's .  He was  placed in the 70 degree head-up tilt table position and his heart rate  increased into the low 70's and high 60's.  His blood pressure gradually  dropped over the course of the next 15 min, from 120 down to 110 beats per  minute.  The pulse remained stable in the 60s.  He was maintained in this  position until 35 min into tilting, when carotid sinus massage was carried  out sequentially in both right and left carotid arteries.  This did not  result in change in blood pressure or heart rate.  He was allowed to remain  in the head-up tilt table position for 45 min, and then placed back in the  supine position.  During the procedure the heart rate remained in the 60s,  and the blood pressure remained in the 100-110 range.  He was asymptomatic.  His syncopal symptoms were not reproduced.  He was placed back in the supine  position and returned to his room in satisfactory condition.   COMPLICATIONS:  There were no immediate procedural complications.   RESULTS:  This demonstrates a negative head-up tilt table test, and a  negative test for carotid sinus hypersensitivity.  The patient will be  referred back to Dr. Dorethea Clan.  If he has recurrent syncopal episodes, then  consideration for implantable loop recorder would be made.                                               Doylene Canning. Ladona Ridgel, M.D.    GWT/MEDQ  D:  04/26/2004  T:  04/26/2004  Job:  [id number]   cc:   Vida Roller, M.D.  Fax: [phone]   Kingsley Callander. Ouida Sills, M.D.  [address]  [place]  [place] [address]  Fax: [phone]

## 2011-01-31 NOTE — Procedures (Signed)
   NAME:  [name] Short, [name] Short NO.:  [id number]   MEDICAL RECORD NO.:  [id number]                  PATIENT TYPE:  PREC   LOCATION:                                       FACILITY:   PHYSICIAN:  Kingsley Callander. Ouida Sills, M.D.                  DATE OF BIRTH:   DATE OF PROCEDURE:  04/06/2003  DATE OF DISCHARGE:                                    STRESS TEST   EXERCISE STRESS TEST   DESCRIPTION:  The patient exercised 11 minutes 24 seconds ( two minutes 24  seconds into stage 4 of the Bruce protocol) obtaining maximal heart rate of  142 (86% of the age predicted maximal heart rate) at a work load of 12.9  METs and discontinued exercise after surpassing his target heart rate.  There were no symptoms of chest pain.  There were no arrhythmias.  There  were no ST segment changes diagnostic of ischemia.  There was a normal blood  pressure response to exercise.  The baseline EKG revealed sinus bradycardia  at 50 beats per minute.   IMPRESSION:  No evidence of exercise-induced ischemia; Cardiolite images  pending.                                               Kingsley Callander. Ouida Sills, M.D.    ROF/MEDQ  D:  04/06/2003  T:  04/06/2003  Job:  [id number]

## 2011-02-14 ENCOUNTER — Ambulatory Visit (INDEPENDENT_AMBULATORY_CARE_PROVIDER_SITE_OTHER): Payer: BC Managed Care – PPO | Admitting: Urology

## 2011-02-14 DIAGNOSIS — N529 Male erectile dysfunction, unspecified: Secondary | ICD-10-CM

## 2011-02-14 DIAGNOSIS — N393 Stress incontinence (female) (male): Secondary | ICD-10-CM

## 2011-02-14 DIAGNOSIS — Z8546 Personal history of malignant neoplasm of prostate: Secondary | ICD-10-CM

## 2011-03-05 IMAGING — CR DG CHEST 2V
2 series · 2 of 2 positions shown · non-contrast
Comparison: 02/28/2010

CLINICAL DATA: Pre admit for left lung mass

CHEST - 2 VIEW

[view not recorded (1 of 2)]
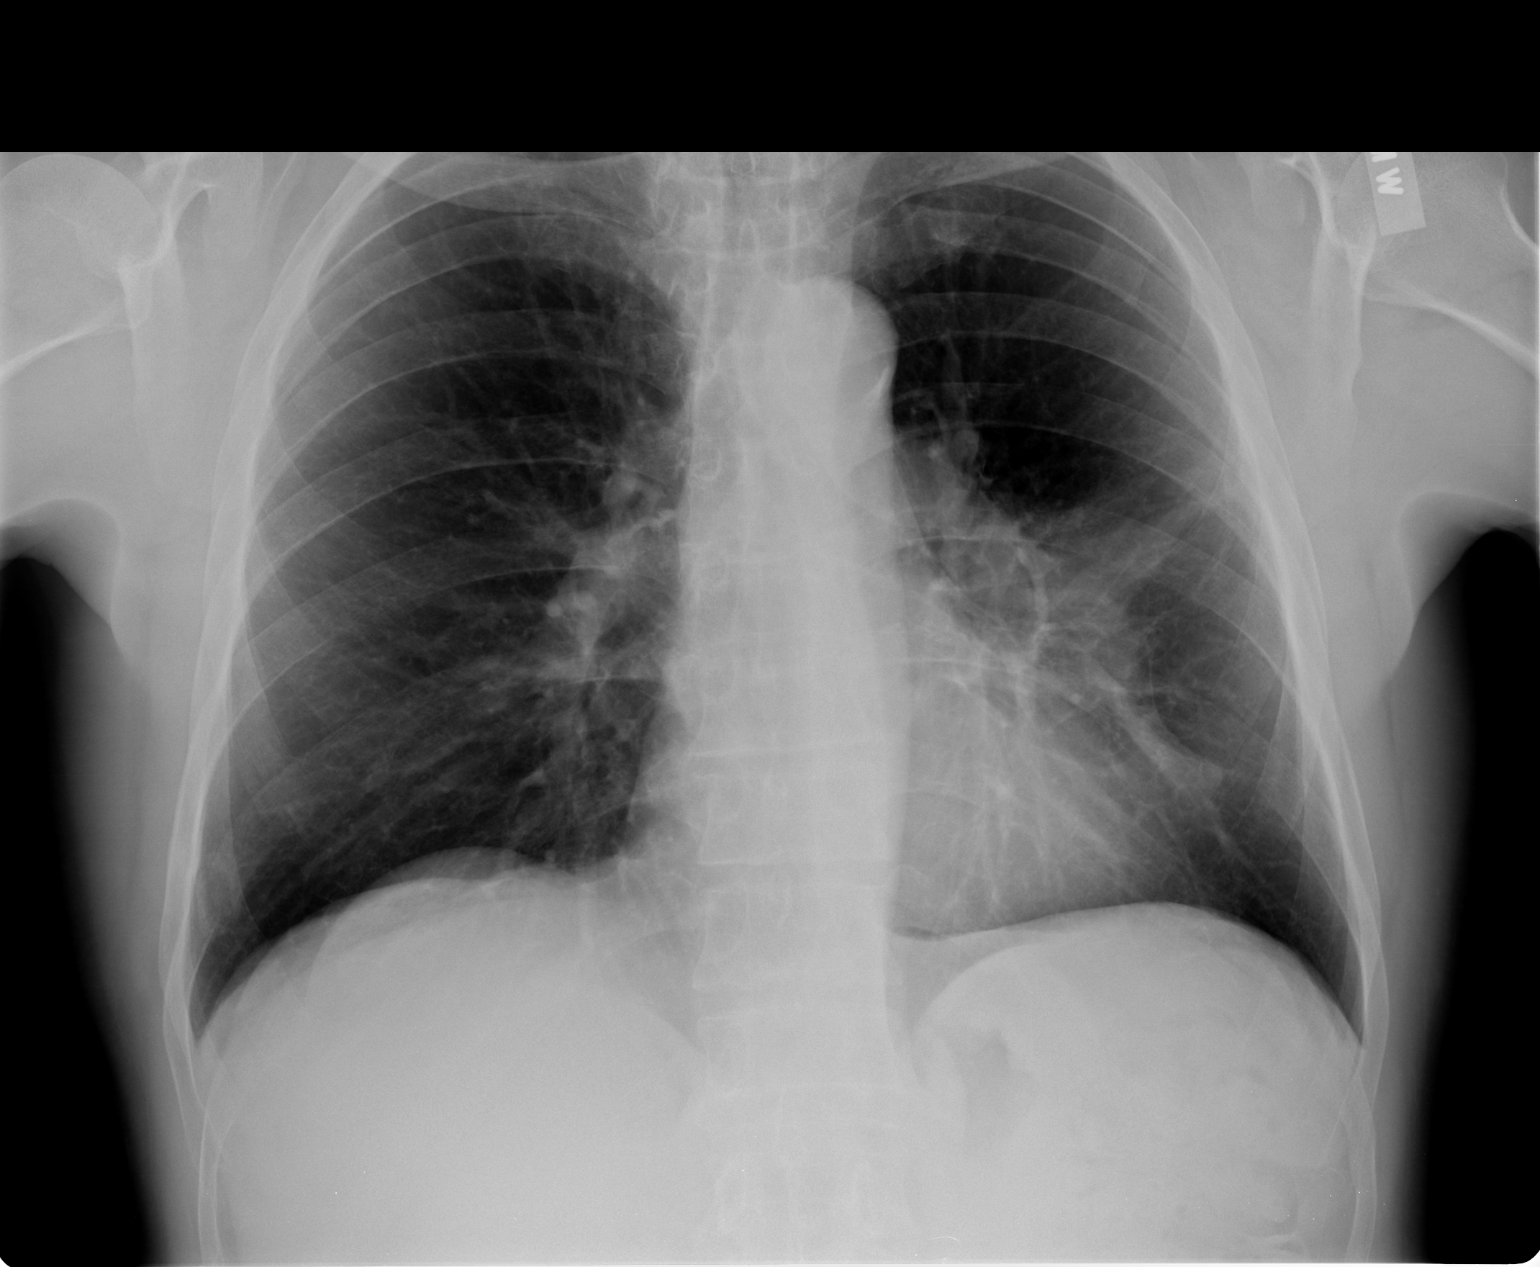

[view not recorded (2 of 2)]
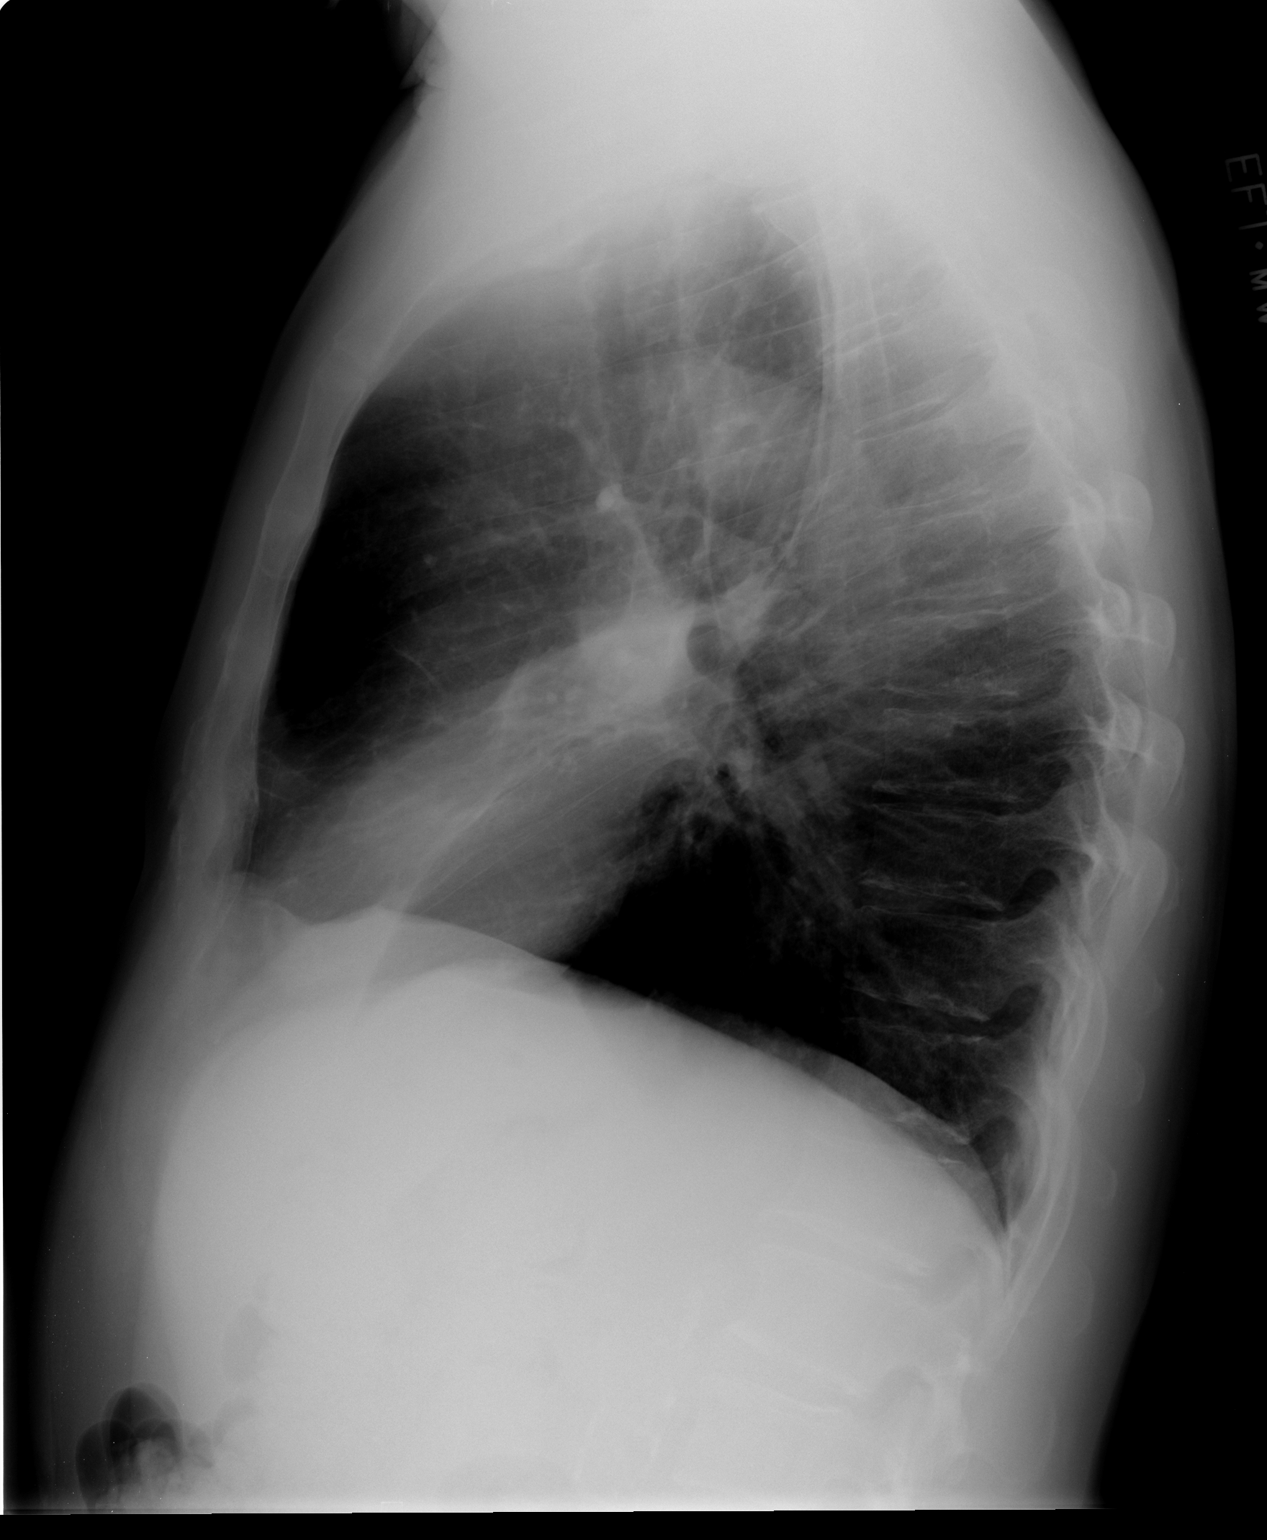

[2 of 2 positions shown; findings below may reference images not displayed]

FINDINGS: The lingular/upper lobe mass appears slightly less
prominent in both views.  Right lung clear.  No pleural fluid.
Lungs hyperaerated as before. Osseous structures intact.
IMPRESSION: Some interval decrease in size of a left lingular/left upper lobe
mass.  No new findings.

## 2011-03-06 IMAGING — CR DG CHEST 1V PORT
1 series · 1 of 1 positions shown · non-contrast
Comparison: Portable exam 8289 hours compared to 03/19/2010

CLINICAL DATA: Left upper lobe mass status post central line
placement and video bronchoscopy

PORTABLE CHEST - 1 VIEW

[view not recorded]
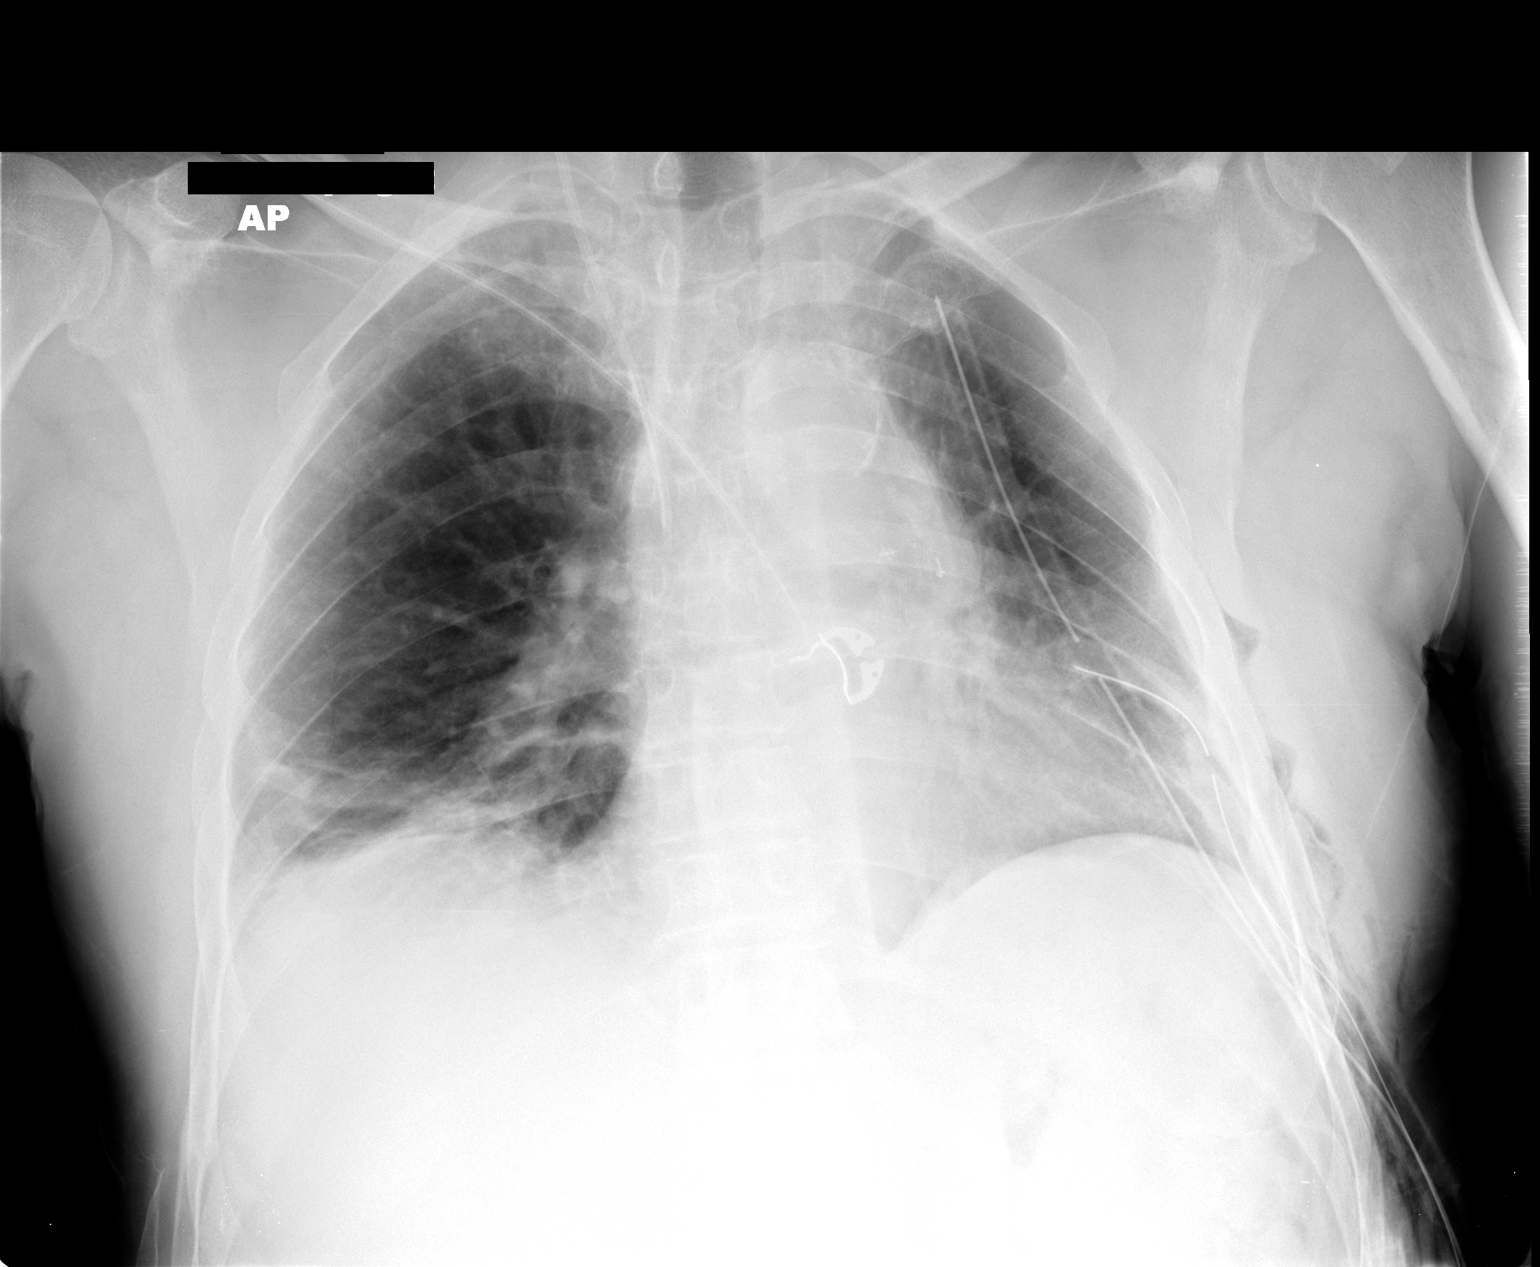

[1 of 1 positions shown; findings below may reference images not displayed]

FINDINGS: New right jugular central venous catheter, tip SVC.
New left thoracostomy tube x 2.
Small amount of chest wall emphysema lateral lower left chest.
Stable heart size.
Atherosclerotic calcification aortic arch.
Decreased lung volumes with bibasilar atelectasis.
No definite pneumothorax.
IMPRESSION: New right jugular line and paired left thoracostomy tubes with
bibasilar atelectasis.
No pneumothorax.

## 2011-03-27 ENCOUNTER — Ambulatory Visit (INDEPENDENT_AMBULATORY_CARE_PROVIDER_SITE_OTHER): Payer: BC Managed Care – PPO | Admitting: Cardiothoracic Surgery

## 2011-03-27 ENCOUNTER — Ambulatory Visit: Payer: Self-pay | Admitting: Cardiothoracic Surgery

## 2011-03-27 DIAGNOSIS — C3490 Malignant neoplasm of unspecified part of unspecified bronchus or lung: Secondary | ICD-10-CM

## 2011-03-27 NOTE — Assessment & Plan Note (Signed)
OFFICE VISIT  [name] Short, [name] Short DOB:  1946-04-26                                        March 27, 2011 CHART #:  [id number]  The patient underwent resection of the left upper lobectomy with lymph node dissection March 20, 2010.  At which time, he was found to have a moderately differentiated squamous cell carcinoma.  On initial resection, he had a 2.3-cm mass with negative nodes.  He has done well. Postoperatively, he returned to work.  He has also been followed by Dr. Shirline Frees 2 months ago.  A CT scan and PET scan were performed, but ultimately it was decided there was pleural based nodular density, but this was ultimately decided to probably be postoperative changes and not evidence of recurrence.  However, the patient is scheduled in approximately 2-3 months to see Dr. Shirline Frees with a followup CT scan. He had a regular appointment to see me today.  We did not obtain any CT scan today as he plans to have one in 2-3 months.  He has no particular complaints.  He is doing well, has had no hemoptysis, no change in weight and no bone pain.  On exam today, his blood pressure 155/93, pulse 76, respiratory rate is 18 and O2 sats 98% on room air.  I do not appreciate any cervical or supraclavicular adenopathy.  His lungs are clear bilaterally.  His last chest tube incision is well-healed without any palpable masses.  He has no abdominal masses.  The lower extremities are without any palpable tenderness.  I will plan to see him in 6 months from now.  He has appointment to see Dr. Shirline Frees in 2-3 months at which time he believes he was to have a CT scan.  He will notify me when the scan is done, so I can review it and then I will follow up with him in 3 months after that.  Sheliah Plane, MD Electronically Signed  EG/MEDQ  D:  03/27/2011  Short:  03/27/2011  Job:  [id number]  cc:   Lajuana Matte, M.D.

## 2011-03-28 IMAGING — CR DG CHEST 2V
2 series · 2 of 2 positions shown · non-contrast
Comparison: 03/26/2010

CLINICAL DATA: Post left lung resection 03/20/2010/cough

CHEST - 2 VIEW

[view not recorded (1 of 2)]
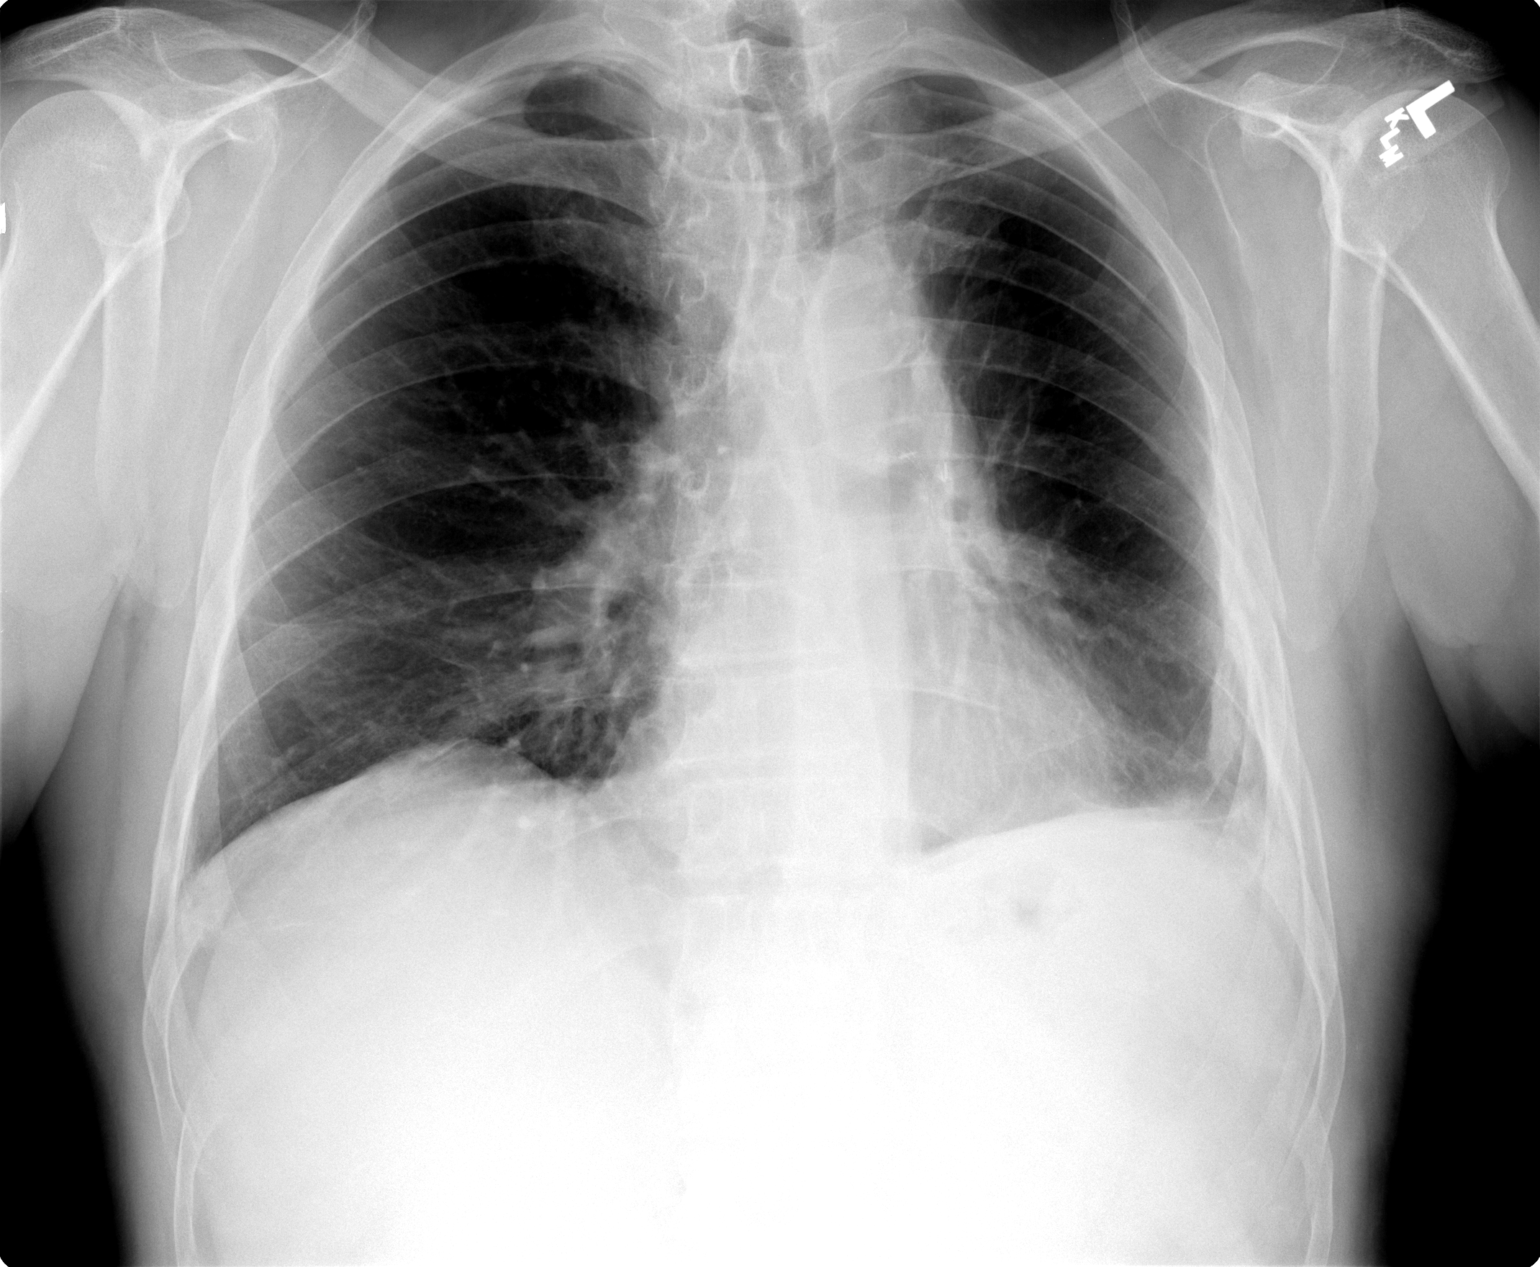

[view not recorded (2 of 2)]
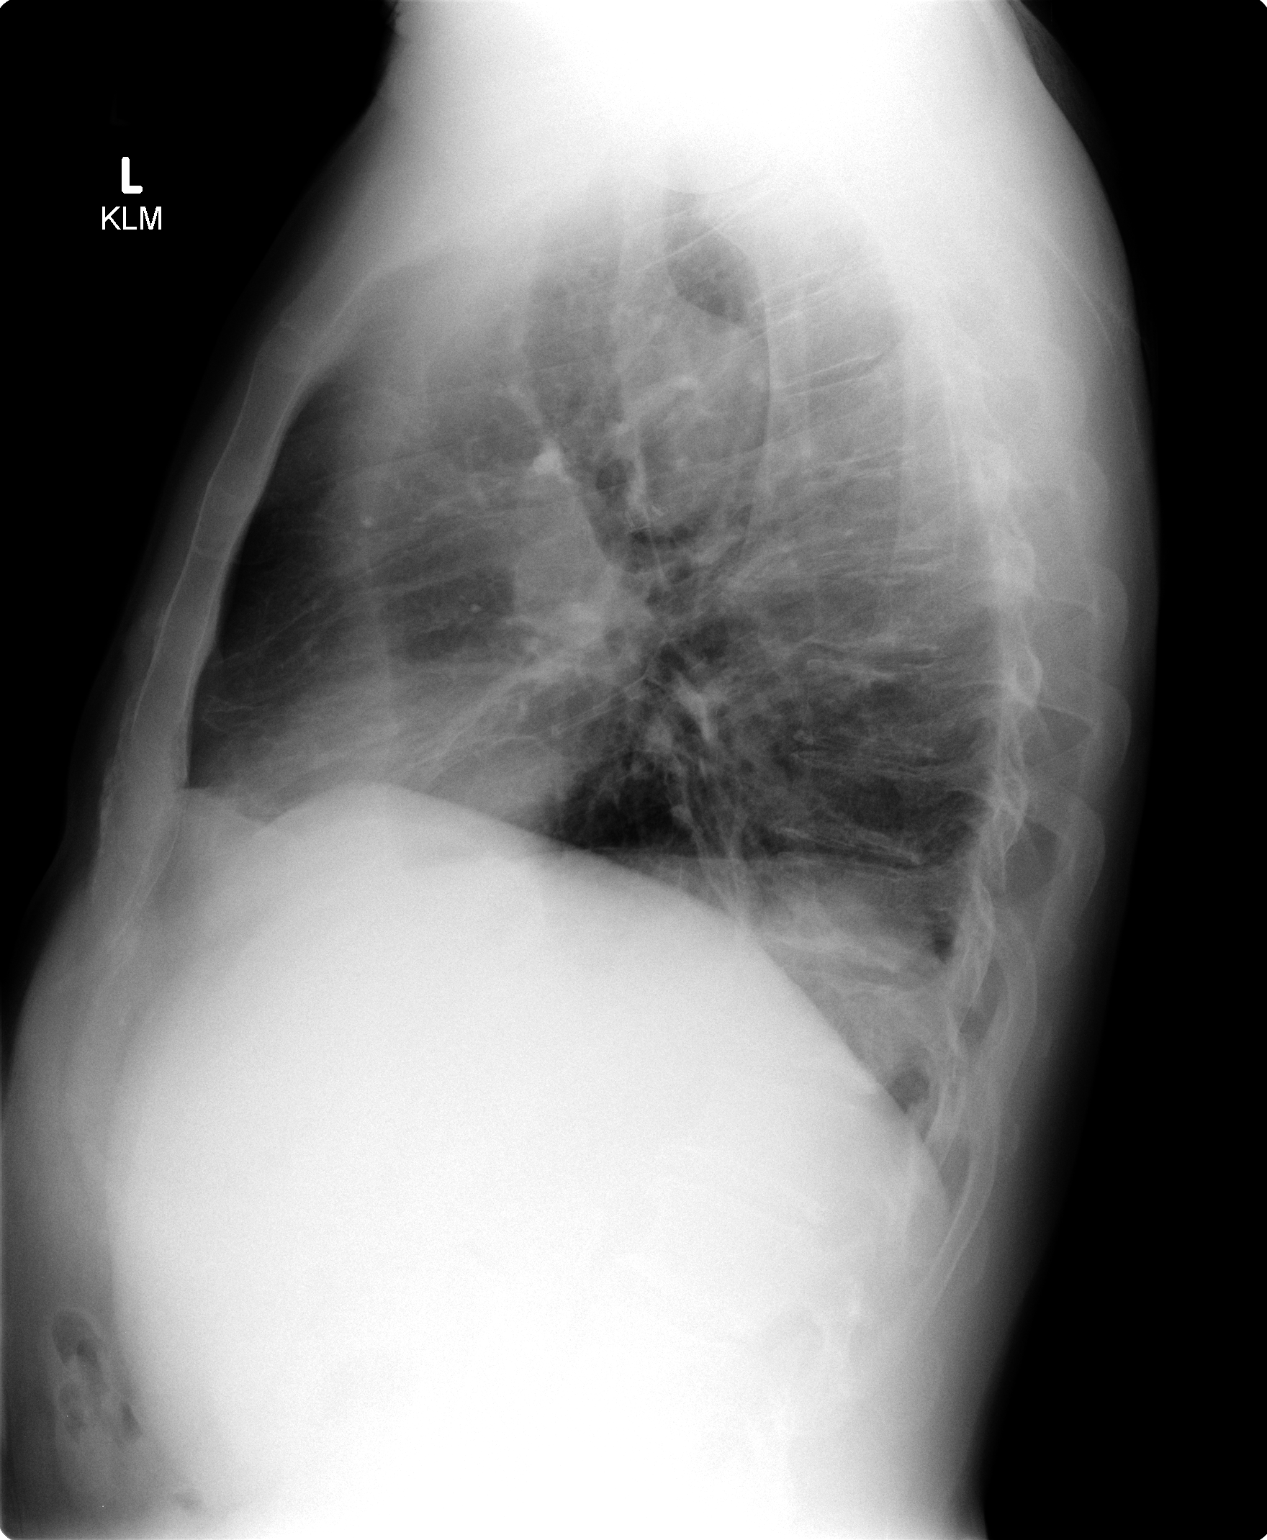

[2 of 2 positions shown; findings below may reference images not displayed]

FINDINGS: Left pneumothorax resolved.  Chronic and/or postoperative
changes noted.  No definite acute process.  Osseous structures
intact.
IMPRESSION: Chronic and postoperative changes - no active disease.  Left
pneumothorax resolved.

## 2011-07-25 ENCOUNTER — Other Ambulatory Visit: Payer: Self-pay | Admitting: Internal Medicine

## 2011-07-25 ENCOUNTER — Ambulatory Visit (HOSPITAL_COMMUNITY)
Admission: RE | Admit: 2011-07-25 | Discharge: 2011-07-25 | Disposition: A | Discharge status: Home / Self Care | Payer: BC Managed Care – PPO | Source: Ambulatory Visit | Attending: Internal Medicine | Admitting: Internal Medicine

## 2011-07-25 ENCOUNTER — Other Ambulatory Visit (HOSPITAL_BASED_OUTPATIENT_CLINIC_OR_DEPARTMENT_OTHER): Payer: BC Managed Care – PPO | Admitting: Lab

## 2011-07-25 DIAGNOSIS — C3490 Malignant neoplasm of unspecified part of unspecified bronchus or lung: Secondary | ICD-10-CM

## 2011-07-25 LAB — CBC WITH DIFFERENTIAL/PLATELET
BASO%: 1.5 % (ref 0.0–2.0)
EOS%: 5.1 % (ref 0.0–7.0)
HCT: 37.7 % — ABNORMAL LOW (ref 38.4–49.9)
MCH: 32.1 pg (ref 27.2–33.4)
MCHC: 34.7 g/dL (ref 32.0–36.0)
NEUT%: 51.8 % (ref 39.0–75.0)
RBC: 4.08 10*6/uL — ABNORMAL LOW (ref 4.20–5.82)
RDW: 12.8 % (ref 11.0–14.6)
WBC: 6.4 10*3/uL (ref 4.0–10.3)
lymph#: 2.1 10*3/uL (ref 0.9–3.3)

## 2011-07-25 LAB — CMP (CANCER CENTER ONLY)
ALT(SGPT): 19 U/L (ref 10–47)
AST: 20 U/L (ref 11–38)
Calcium: 9 mg/dL (ref 8.0–10.3)
Chloride: 105 mEq/L (ref 98–108)
Creat: 0.9 mg/dl (ref 0.6–1.2)
Sodium: 144 mEq/L (ref 128–145)
Total Protein: 7.5 g/dL (ref 6.4–8.1)

## 2011-07-25 MED ORDER — IOHEXOL 300 MG/ML  SOLN
80.0000 mL | Freq: Once | INTRAMUSCULAR | Status: AC | PRN
  Administered 2011-07-25: 80 mL via INTRAVENOUS

## 2011-08-20 ENCOUNTER — Telehealth: Payer: Self-pay | Admitting: Internal Medicine

## 2011-08-20 NOTE — Telephone Encounter (Signed)
Lvm advising appt 10/20/11 @ 11am r/s'd from 11/14 appt cx'd due to Epic.

## 2011-08-29 ENCOUNTER — Ambulatory Visit (INDEPENDENT_AMBULATORY_CARE_PROVIDER_SITE_OTHER): Payer: BC Managed Care – PPO | Admitting: Urology

## 2011-08-29 DIAGNOSIS — N393 Stress incontinence (female) (male): Secondary | ICD-10-CM

## 2011-08-29 DIAGNOSIS — N529 Male erectile dysfunction, unspecified: Secondary | ICD-10-CM

## 2011-08-29 DIAGNOSIS — Z8546 Personal history of malignant neoplasm of prostate: Secondary | ICD-10-CM

## 2011-10-02 ENCOUNTER — Ambulatory Visit: Payer: BC Managed Care – PPO | Admitting: Cardiothoracic Surgery

## 2011-10-08 ENCOUNTER — Other Ambulatory Visit: Payer: Self-pay | Admitting: Cardiothoracic Surgery

## 2011-10-08 DIAGNOSIS — C3490 Malignant neoplasm of unspecified part of unspecified bronchus or lung: Secondary | ICD-10-CM

## 2011-10-09 ENCOUNTER — Ambulatory Visit (INDEPENDENT_AMBULATORY_CARE_PROVIDER_SITE_OTHER): Payer: BC Managed Care – PPO | Admitting: Cardiothoracic Surgery

## 2011-10-09 ENCOUNTER — Ambulatory Visit
Admission: RE | Admit: 2011-10-09 | Discharge: 2011-10-09 | Disposition: A | Discharge status: Home / Self Care | Payer: BC Managed Care – PPO | Source: Ambulatory Visit | Attending: Cardiothoracic Surgery | Admitting: Cardiothoracic Surgery

## 2011-10-09 ENCOUNTER — Encounter: Payer: Self-pay | Admitting: Cardiothoracic Surgery

## 2011-10-09 VITALS — BP 132/79 | HR 70 | Resp 16 | Ht 67.5 in | Wt 178.0 lb

## 2011-10-09 DIAGNOSIS — C3490 Malignant neoplasm of unspecified part of unspecified bronchus or lung: Secondary | ICD-10-CM

## 2011-10-09 DIAGNOSIS — C3410 Malignant neoplasm of upper lobe, unspecified bronchus or lung: Secondary | ICD-10-CM

## 2011-10-09 DIAGNOSIS — Z09 Encounter for follow-up examination after completed treatment for conditions other than malignant neoplasm: Secondary | ICD-10-CM

## 2011-10-09 NOTE — Progress Notes (Signed)
[address]            [name] [address]          [phone]       [name] Medical Record #[id number] Date of Birth: 1946/04/17  [name] Perches, MD No primary provider on file.  Chief Complaint:   PostOp Follow Up Visit History of Present Illness:      The patient returns to the office today in followup after a  left video-assisted thoracoscopy and left upper lobectomy for a squamous  cell carcinoma. At the time of surgery, it was a T2N0M0 carcinoma of  the left upper lobe.03/20/2010 He notes that he has been doing very well. Working full time    History  Smoking status  . Former Smoker  . Quit date: 09/07/2008  Smokeless tobacco  . Former Neurosurgeon  . Types: Chew  . Quit date: 10/08/1985       No Known Allergies  Current Outpatient Prescriptions  Medication Sig Dispense Refill  . acetaminophen (TYLENOL) 650 MG CR tablet Take 650 mg by mouth every 8 (eight) hours as needed.      . brimonidine (ALPHAGAN) 0.15 % ophthalmic solution Place 1 drop into the right eye 3 (three) times daily.      [name] losartan (COZAAR) 100 MG tablet Take 100 mg by mouth daily.      [name] NIFEdipine (ADALAT CC) 90 MG 24 hr tablet Take 90 mg by mouth daily.      [name] omeprazole (PRILOSEC) 20 MG capsule Take 20 mg by mouth daily.           Physical Exam: BP 132/79  Pulse 70  Resp 16  Ht 5' 7.5" (1.715 m)  Wt 178 lb (80.74 kg)  BMI 27.47 kg/m2  SpO2 97%  General appearance: alert and cooperative Neurologic: intact Heart: regular rate and rhythm, S1, S2 normal, no murmur, click, rub or gallop Lungs: clear to auscultation bilaterally Abdomen: soft, non-tender; bowel sounds normal; no masses,  no organomegaly Extremities: extremities normal, atraumatic, no cyanosis or edema and Homans sign is negative, no sign of DVT Wound: Patient's chest incision is well-healed there is no cervical supraclavicular adenopathy   Diagnostic Studies & Laboratory  data:         Recent Radiology Findings: Dg Chest 2 View  10/09/2011  *RADIOLOGY REPORT*  Clinical Data: Status post left VATS and left upper lobectomy for squamous cell carcinoma, follow-up  CHEST - 2 VIEW  Comparison: CT chest of 07/25/2011 and chest x-ray of 10/04/2010  Findings: The lungs are clear.  No evidence of recurrence of tumor is seen.  Surgical clips overlie the left hilum.  Mediastinal contours are stable.  The heart is within normal limits in size. No acute bony abnormality is seen.  IMPRESSION: Stable postoperative changes on the left.  Original Report Authenticated By: [name], M.D.   07/25/2011  CT CHEST WITH CONTRAST  Technique: Multidetector CT imaging of the chest was performed  following the standard protocol during bolus administration of  intravenous contrast.  Contrast: 80mL OMNIPAQUE IOHEXOL 300 MG/ML IV SOLN  Comparison: 01/17/2011. PET CT from 01/27/2011.  Findings: There is no axillary lymphadenopathy. A small lymph  nodes are seen in the mediastinum and right hilum. No overt  lymphadenopathy in the chest. Several small lymph nodes along the  distal esophagus are stable. Heart  size is normal. Coronary  artery calcification is evident.  8 x 16 mm ill-defined left subpleural nodule measured 10 x 16 mm  previously. This nodule had borderline F D G uptake on recent PET  scan.  Bone windows reveal no worrisome lytic or sclerotic osseous  lesions.  IMPRESSION:  Stable subpleural nodule the left lung in the 60-month interval  since the prior chest CT. No new or progressive disease.  Original Report Authenticated By: ERIC A. Short, M.D.  Recent Labs: Lab Results  Component Value Date   WBC 6.4 07/25/2011   HGB 13.1 07/25/2011   HCT 37.7* 07/25/2011   PLT 302 07/25/2011   GLUCOSE 117 07/25/2011   ALT 23 07/19/2010   AST 20 07/25/2011   NA 144 07/25/2011   K 4.8* 07/25/2011   CL 105 07/25/2011   CREATININE 0.9 07/25/2011   BUN 24* 07/25/2011   CO2 27 07/25/2011     INR 0.94 03/19/2010      Assessment / Plan:     The patient is doing well without any significant symptoms working full-time.  His followup CT scan showed postoperative scarring but no evidence of recurrent disease. The ill-defined pleural density on the left is actually decreased in size. Plan to see the patient back in 6 months.     [name] MD 10/09/2011 5:08 PM

## 2011-10-20 ENCOUNTER — Ambulatory Visit (HOSPITAL_BASED_OUTPATIENT_CLINIC_OR_DEPARTMENT_OTHER): Payer: BC Managed Care – PPO | Admitting: Internal Medicine

## 2011-10-20 ENCOUNTER — Telehealth: Payer: Self-pay | Admitting: Internal Medicine

## 2011-10-20 VITALS — BP 153/84 | HR 65 | Temp 97.1°F | Wt 175.6 lb

## 2011-10-20 DIAGNOSIS — C3490 Malignant neoplasm of unspecified part of unspecified bronchus or lung: Secondary | ICD-10-CM

## 2011-10-20 NOTE — Telephone Encounter (Signed)
Gv pt appt for aug2013.  scheduled ct scan for 08/05 @ [facility]

## 2011-10-20 NOTE — Progress Notes (Signed)
[facility] OFFICE PROGRESS NOTE  PRINCIPAL DIAGNOSIS:  Stage 1A (T1b N0 M0) non-small cell lung cancer, squamous cell carcinoma diagnosed in June, 2011.  PRIOR THERAPY:  Status post left upper lobectomy with lymph node dissection under the care of Dr. Tyrone Sage on March 20, 2010.  CURRENT THERAPY:  Observation.  INTERVAL HISTORY: [name] Short 66 y.o. male returns to the clinic today for followup visit accompanied his wife. The patient was last seen on 01/28/2011. He is feeling fine and denied having any specific complaints today. No significant weight loss or night sweats. No chest pain or shortness of breath, no cough or hemoptysis. He has repeat CT scan of the chest performed in November of 2012 and the patient is here today for evaluation and discussion of his scan results.  MEDICAL HISTORY: Past Medical History  Diagnosis Date  . Prostate cancer     prostate ca dx 11/11  . Lung cancer     lung ca dx 02/2010  . Hypertension     ALLERGIES:   has no known allergies.  MEDICATIONS:  Current Outpatient Prescriptions  Medication Sig Dispense Refill  . brimonidine (ALPHAGAN) 0.15 % ophthalmic solution Place 1 drop into the right eye 3 (three) times daily.      [name] losartan (COZAAR) 100 MG tablet Take 100 mg by mouth daily.      . Multiple Vitamin (MULTIVITAMIN) tablet Take 1 tablet by mouth daily.      [name] NIFEdipine (ADALAT CC) 90 MG 24 hr tablet Take 90 mg by mouth daily.      [name] omeprazole (PRILOSEC) 20 MG capsule Take 20 mg by mouth daily.      [name] acetaminophen (TYLENOL) 650 MG CR tablet Take 650 mg by mouth every 8 (eight) hours as needed.        REVIEW OF SYSTEMS:  A comprehensive review of systems was negative.   PHYSICAL EXAMINATION: General appearance: alert, cooperative and no distress Resp: clear to auscultation bilaterally Cardio: regular rate and rhythm, S1, S2 normal, no murmur, click, rub or gallop GI: soft, non-tender; bowel sounds normal; no masses,   no organomegaly Extremities: extremities normal, atraumatic, no cyanosis or edema  ECOG PERFORMANCE STATUS: 0 - Asymptomatic  Blood pressure 153/84, pulse 65, temperature 97.1 F (36.2 C), temperature source Oral, weight 175 lb 9.6 oz (79.652 kg).  LABORATORY DATA: Lab Results  Component Value Date   WBC 6.4 07/25/2011   HGB 13.1 07/25/2011   HCT 37.7* 07/25/2011   MCV 92.4 07/25/2011   PLT 302 07/25/2011      Chemistry      Component Value Date/Time   NA 144 07/25/2011 0828   NA 141 09/03/2010 0930   K 4.8* 07/25/2011 0828   K 4.4 09/03/2010 0930   CL 105 07/25/2011 0828   CL 109 09/03/2010 0930   CO2 27 07/25/2011 0828   CO2 24 09/03/2010 0930   BUN 24* 07/25/2011 0828   BUN 29* 09/03/2010 0930   CREATININE 0.9 07/25/2011 0828   CREATININE 1.00 09/03/2010 0930      Component Value Date/Time   CALCIUM 9.0 07/25/2011 0828   CALCIUM 9.7 09/03/2010 0930   ALKPHOS 65 07/25/2011 0828   ALKPHOS 87 07/19/2010 0813   AST 20 07/25/2011 0828   AST 23 07/19/2010 0813   ALT 23 07/19/2010 0813   BILITOT 0.60 07/25/2011 0828   BILITOT 0.3 07/19/2010 0813       RADIOGRAPHIC STUDIES:  Dg Chest 2 View  10/09/2011  *RADIOLOGY REPORT*  Clinical Data: Status post left VATS and left upper lobectomy for squamous cell carcinoma, follow-up  CHEST - 2 VIEW  Comparison: CT chest of 07/25/2011 and chest x-ray of 10/04/2010  Findings: The lungs are clear.  No evidence of recurrence of tumor is seen.  Surgical clips overlie the left hilum.  Mediastinal contours are stable.  The heart is within normal limits in size. No acute bony abnormality is seen.  IMPRESSION: Stable postoperative changes on the left.  Original Report Authenticated By: [name], M.D.   CT CHEST WITH CONTRAST  Technique: Multidetector CT imaging of the chest was performed  following the standard protocol during bolus administration of  intravenous contrast.  Contrast: 80mL OMNIPAQUE IOHEXOL 300 MG/ML IV SOLN  Comparison: 01/17/2011.  PET CT from 01/27/2011.  Findings: There is no axillary lymphadenopathy. A small lymph  nodes are seen in the mediastinum and right hilum. No overt  lymphadenopathy in the chest. Several small lymph nodes along the  distal esophagus are stable. Heart size is normal. Coronary  artery calcification is evident.  8 x 16 mm ill-defined left subpleural nodule measured 10 x 16 mm  previously. This nodule had borderline F D G uptake on recent PET  scan.  Bone windows reveal no worrisome lytic or sclerotic osseous  lesions.  IMPRESSION:  Stable subpleural nodule the left lung in the 40-month interval  since the prior chest CT. No new or progressive disease.    ASSESSMENT:  This is a very pleasant 66 years old white male with history of stage IA non-small cell lung cancer status post left upper lobectomy with lymph node dissection. The patient is doing fine and he has no evidence for disease recurrence.   PLAN: I discussed the scan results with [name] Short and recommended for him continuous observation for now.  I would see him back for followup visit in 6 months with repeat CT scan of the chest with contrast. The patient was advised to call me immediately if he has any concerning symptoms in the interval.   All questions were answered. The patient knows to call the clinic with any problems, questions or concerns. We can certainly see the patient much sooner if necessary.

## 2012-04-02 ENCOUNTER — Telehealth: Payer: Self-pay | Admitting: Internal Medicine

## 2012-04-02 NOTE — Telephone Encounter (Signed)
l/m with new appt info

## 2012-04-15 ENCOUNTER — Ambulatory Visit: Payer: BC Managed Care – PPO | Admitting: Cardiothoracic Surgery

## 2012-04-19 ENCOUNTER — Ambulatory Visit (HOSPITAL_COMMUNITY)
Admission: RE | Admit: 2012-04-19 | Discharge: 2012-04-19 | Disposition: A | Discharge status: Home / Self Care | Payer: BC Managed Care – PPO | Source: Ambulatory Visit | Attending: Internal Medicine | Admitting: Internal Medicine

## 2012-04-19 ENCOUNTER — Other Ambulatory Visit (HOSPITAL_BASED_OUTPATIENT_CLINIC_OR_DEPARTMENT_OTHER): Payer: BC Managed Care – PPO | Admitting: Lab

## 2012-04-19 DIAGNOSIS — I700 Atherosclerosis of aorta: Secondary | ICD-10-CM | POA: Insufficient documentation

## 2012-04-19 DIAGNOSIS — Z902 Acquired absence of lung [part of]: Secondary | ICD-10-CM | POA: Insufficient documentation

## 2012-04-19 DIAGNOSIS — Z9221 Personal history of antineoplastic chemotherapy: Secondary | ICD-10-CM | POA: Insufficient documentation

## 2012-04-19 DIAGNOSIS — C3490 Malignant neoplasm of unspecified part of unspecified bronchus or lung: Secondary | ICD-10-CM | POA: Insufficient documentation

## 2012-04-19 DIAGNOSIS — C61 Malignant neoplasm of prostate: Secondary | ICD-10-CM | POA: Insufficient documentation

## 2012-04-19 DIAGNOSIS — I2510 Atherosclerotic heart disease of native coronary artery without angina pectoris: Secondary | ICD-10-CM | POA: Insufficient documentation

## 2012-04-19 DIAGNOSIS — C6990 Malignant neoplasm of unspecified site of unspecified eye: Secondary | ICD-10-CM | POA: Insufficient documentation

## 2012-04-19 LAB — CBC WITH DIFFERENTIAL/PLATELET
BASO%: 1.3 % (ref 0.0–2.0)
EOS%: 2.7 % (ref 0.0–7.0)
HCT: 39.2 % (ref 38.4–49.9)
LYMPH%: 39.4 % (ref 14.0–49.0)
MCH: 32 pg (ref 27.2–33.4)
MCHC: 34.5 g/dL (ref 32.0–36.0)
NEUT%: 47.6 % (ref 39.0–75.0)
Platelets: 285 10*3/uL (ref 140–400)
lymph#: 2.6 10*3/uL (ref 0.9–3.3)

## 2012-04-19 LAB — CMP (CANCER CENTER ONLY)
ALT(SGPT): 30 U/L (ref 10–47)
AST: 26 U/L (ref 11–38)
Albumin: 3.9 g/dL (ref 3.3–5.5)
Alkaline Phosphatase: 80 U/L (ref 26–84)
Calcium: 9.1 mg/dL (ref 8.0–10.3)
Chloride: 106 mEq/L (ref 98–108)
Potassium: 4.2 mEq/L (ref 3.3–4.7)
Sodium: 142 mEq/L (ref 128–145)
Total Protein: 8.2 g/dL — ABNORMAL HIGH (ref 6.4–8.1)

## 2012-04-19 MED ORDER — IOHEXOL 300 MG/ML  SOLN
80.0000 mL | Freq: Once | INTRAMUSCULAR | Status: AC | PRN
  Administered 2012-04-19: 80 mL via INTRAVENOUS

## 2012-04-22 ENCOUNTER — Ambulatory Visit: Payer: BC Managed Care – PPO | Admitting: Internal Medicine

## 2012-04-27 ENCOUNTER — Other Ambulatory Visit: Payer: Self-pay | Admitting: Cardiothoracic Surgery

## 2012-04-27 ENCOUNTER — Telehealth: Payer: Self-pay | Admitting: Internal Medicine

## 2012-04-27 ENCOUNTER — Ambulatory Visit (HOSPITAL_BASED_OUTPATIENT_CLINIC_OR_DEPARTMENT_OTHER): Payer: BC Managed Care – PPO | Admitting: Internal Medicine

## 2012-04-27 VITALS — BP 142/85 | HR 74 | Temp 96.7°F | Resp 20 | Ht 67.5 in | Wt 177.2 lb

## 2012-04-27 DIAGNOSIS — C3410 Malignant neoplasm of upper lobe, unspecified bronchus or lung: Secondary | ICD-10-CM

## 2012-04-27 DIAGNOSIS — C3490 Malignant neoplasm of unspecified part of unspecified bronchus or lung: Secondary | ICD-10-CM

## 2012-04-27 DIAGNOSIS — C3492 Malignant neoplasm of unspecified part of left bronchus or lung: Secondary | ICD-10-CM | POA: Insufficient documentation

## 2012-04-27 NOTE — Progress Notes (Signed)
[facility] [phone]   [phone]  OFFICE PROGRESS NOTE  PRINCIPAL DIAGNOSIS: Stage 1A (T1b N0 M0) non-small cell lung cancer, squamous cell carcinoma diagnosed in June, 2011.   PRIOR THERAPY: Status post left upper lobectomy with lymph node dissection under the care of Dr. Tyrone Sage on March 20, 2010.   CURRENT THERAPY: Observation.  INTERVAL HISTORY: [name] Short 66 y.o. male returns to the clinic today for routine six-month followup visit. The patient is doing fine with no specific complaints. He denied having any significant chest pain or shortness breath, no cough or hemoptysis. He denied having any significant weight loss or night sweats. The patient has repeat CT scan of the chest performed recently and he is here today for evaluation and discussion of his scan results.  MEDICAL HISTORY: Past Medical History  Diagnosis Date  . Prostate cancer     prostate ca dx 11/11  . Lung cancer     lung ca dx 02/2010  . Hypertension     ALLERGIES:   has no known allergies.  MEDICATIONS:  Current Outpatient Prescriptions  Medication Sig Dispense Refill  . acetaminophen (TYLENOL) 650 MG CR tablet Take 650 mg by mouth every 8 (eight) hours as needed.      . brimonidine (ALPHAGAN) 0.15 % ophthalmic solution Place 1 drop into the right eye 3 (three) times daily.      [name] losartan (COZAAR) 100 MG tablet Take 100 mg by mouth daily.      . Multiple Vitamin (MULTIVITAMIN) tablet Take 1 tablet by mouth daily.      [name] NIFEdipine (ADALAT CC) 90 MG 24 hr tablet Take 90 mg by mouth daily.      [name] omeprazole (PRILOSEC) 20 MG capsule Take 20 mg by mouth daily.        REVIEW OF SYSTEMS:  A comprehensive review of systems was negative.   PHYSICAL EXAMINATION: General appearance: alert, cooperative and no distress Head: Normocephalic, without obvious abnormality, atraumatic Neck: no adenopathy Lymph nodes: Cervical, supraclavicular, and axillary nodes  normal. Resp: clear to auscultation bilaterally Cardio: regular rate and rhythm, S1, S2 normal, no murmur, click, rub or gallop GI: soft, non-tender; bowel sounds normal; no masses,  no organomegaly Extremities: extremities normal, atraumatic, no cyanosis or edema Neurologic: Alert and oriented X 3, normal strength and tone. Normal symmetric reflexes. Normal coordination and gait  ECOG PERFORMANCE STATUS: 0 - Asymptomatic  Blood pressure 142/85, pulse 74, temperature 96.7 F (35.9 C), temperature source Oral, resp. rate 20, height 5' 7.5" (1.715 m), weight 177 lb 3.2 oz (80.377 kg).  LABORATORY DATA: Lab Results  Component Value Date   WBC 6.6 04/19/2012   HGB 13.5 04/19/2012   HCT 39.2 04/19/2012   MCV 92.7 04/19/2012   PLT 285 04/19/2012      Chemistry      Component Value Date/Time   NA 142 04/19/2012 0835   NA 141 09/03/2010 0930   K 4.2 04/19/2012 0835   K 4.4 09/03/2010 0930   CL 106 04/19/2012 0835   CL 109 09/03/2010 0930   CO2 24 04/19/2012 0835   CO2 24 09/03/2010 0930   BUN 30* 04/19/2012 0835   BUN 29* 09/03/2010 0930   CREATININE 1.4* 04/19/2012 0835   CREATININE 1.00 09/03/2010 0930      Component Value Date/Time   CALCIUM 9.1 04/19/2012 0835   CALCIUM 9.7 09/03/2010 0930   ALKPHOS 80 04/19/2012 0835   ALKPHOS 87 07/19/2010  0813   AST 26 04/19/2012 0835   AST 23 07/19/2010 0813   ALT 23 07/19/2010 0813   BILITOT 0.60 04/19/2012 0835   BILITOT 0.3 07/19/2010 0813       RADIOGRAPHIC STUDIES: Ct Chest W Contrast  04/19/2012  *RADIOLOGY REPORT*  Clinical Data: Lung cancer diagnosed 02/2010 post resection, prostate cancer diagnosed 07/2010 status post resection, 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  Contrast: 80mL OMNIPAQUE IOHEXOL 300 MG/ML  SOLN  Comparison: 07/25/2011  Findings: Status post  right upper lobectomy.  1.8 x 0.9 cm subpleural opacity in the lateral left lower lobe (series 5/image 39), previously 1.6 x 0.8 cm, grossly unchanged from prior studies since 2011.  Additional mild nodularity along the right minor fissure (series 5/image 28), unchanged. No pleural effusion or pneumothorax.  Visualized thyroid is unremarkable.  Heart is normal in size.  No pericardial effusion. Coronary atherosclerosis.  Atherosclerotic calcifications of the aortic arch.  No suspicious mediastinal, hilar, or axillary lymphadenopathy.  Visualized upper abdomen is unremarkable.  Mild degenerative changes of the visualized thoracolumbar spine.  IMPRESSION: Status post right upper lobectomy.  No evidence of metastatic disease in the chest.  1.8 x 0.9 cm subpleural opacity in the lateral left lower lobe, grossly unchanged.  Original Report Authenticated By: Charline Bills, M.D.    ASSESSMENT: This is a very pleasant 66 years old white male with history of stage IA non-small cell lung cancer status post left upper lobectomy with lymph node dissection and has been observation since July 2011 with no evidence for disease recurrence.  PLAN: I discussed the scan results with the patient and recommended for him continuous observation for now with repeat CT scan of the chest with contrast in 6 months. He would come back for followup visit at that time.  He was advised to call me immediately if he has any concerning symptoms in the interval.  All questions were answered. The patient knows to call the clinic with any problems, questions or concerns. We can certainly see the patient much sooner if necessary.

## 2012-04-27 NOTE — Telephone Encounter (Signed)
gve the pt his feb 2014 appt calendar along with the ct scan appt °

## 2012-04-29 ENCOUNTER — Ambulatory Visit: Payer: BC Managed Care – PPO | Admitting: Cardiothoracic Surgery

## 2012-05-21 ENCOUNTER — Ambulatory Visit (INDEPENDENT_AMBULATORY_CARE_PROVIDER_SITE_OTHER): Payer: BC Managed Care – PPO | Admitting: Urology

## 2012-05-21 DIAGNOSIS — N529 Male erectile dysfunction, unspecified: Secondary | ICD-10-CM

## 2012-05-21 DIAGNOSIS — Z8546 Personal history of malignant neoplasm of prostate: Secondary | ICD-10-CM

## 2012-05-21 DIAGNOSIS — N393 Stress incontinence (female) (male): Secondary | ICD-10-CM

## 2012-05-25 ENCOUNTER — Other Ambulatory Visit: Payer: Self-pay | Admitting: Cardiothoracic Surgery

## 2012-05-25 DIAGNOSIS — C3410 Malignant neoplasm of upper lobe, unspecified bronchus or lung: Secondary | ICD-10-CM

## 2012-05-27 ENCOUNTER — Encounter: Payer: Self-pay | Admitting: Cardiothoracic Surgery

## 2012-05-27 ENCOUNTER — Ambulatory Visit
Admission: RE | Admit: 2012-05-27 | Discharge: 2012-05-27 | Disposition: A | Discharge status: Home / Self Care | Payer: BC Managed Care – PPO | Source: Ambulatory Visit | Attending: Cardiothoracic Surgery | Admitting: Cardiothoracic Surgery

## 2012-05-27 ENCOUNTER — Ambulatory Visit (INDEPENDENT_AMBULATORY_CARE_PROVIDER_SITE_OTHER): Payer: BC Managed Care – PPO | Admitting: Cardiothoracic Surgery

## 2012-05-27 VITALS — BP 143/80 | HR 74 | Resp 20 | Ht 67.5 in | Wt 178.0 lb

## 2012-05-27 DIAGNOSIS — C3410 Malignant neoplasm of upper lobe, unspecified bronchus or lung: Secondary | ICD-10-CM

## 2012-05-27 DIAGNOSIS — Z09 Encounter for follow-up examination after completed treatment for conditions other than malignant neoplasm: Secondary | ICD-10-CM

## 2012-05-27 NOTE — Progress Notes (Signed)
[address]            [place] [address]          [phone]        [facility] Medical Record #[id number] Date of Birth: [birth year]  Carylon Perches, MD Dr Gwenyth Bouillon   Chief Complaint:   PostOp Follow Up Visit History of Present Illness:      The patient returns to the office today in followup after a  left video-assisted thoracoscopy and left upper lobectomy for a squamous  cell carcinoma. At the time of surgery, it was a T2N0M0 carcinoma of  the left upper lobe. 03/20/2010 He notes that he has been doing very well. Working full time    History  Smoking status  . Former Smoker  . Quit date: 09/07/2008  Smokeless tobacco  . Former Neurosurgeon  . Types: Chew  . Quit date: 10/08/1985       No Known Allergies  Current Outpatient Prescriptions  Medication Sig Dispense Refill  . acetaminophen (TYLENOL) 650 MG CR tablet Take 650 mg by mouth every 8 (eight) hours as needed.      . brimonidine (ALPHAGAN) 0.15 % ophthalmic solution Place 1 drop into the right eye 3 (three) times daily.      [name] losartan (COZAAR) 100 MG tablet Take 100 mg by mouth daily.      . Multiple Vitamin (MULTIVITAMIN) tablet Take 1 tablet by mouth daily.      [name] NIFEdipine (ADALAT CC) 90 MG 24 hr tablet Take 90 mg by mouth daily.      [name] omeprazole (PRILOSEC) 20 MG capsule Take 20 mg by mouth daily.           Physical Exam: BP 143/80  Pulse 74  Resp 20  Ht 5' 7.5" (1.715 m)  Wt 178 lb (80.74 kg)  BMI 27.47 kg/m2  SpO2 97%  General appearance: alert and cooperative Neurologic: intact Heart: regular rate and rhythm, S1, S2 normal, no murmur, click, rub or gallop Lungs: clear to auscultation bilaterally Abdomen: soft, non-tender; bowel sounds normal; no masses,  no organomegaly Extremities: extremities normal, atraumatic, no cyanosis or edema and Homans sign is negative, no sign of DVT Wound: Patient's chest incision is well-healed there is no  cervical supraclavicular adenopathy   Diagnostic Studies & Laboratory data:         Recent Radiology Findings: Dg Chest 2 View  05/27/2012  *RADIOLOGY REPORT*  Clinical Data: Prior left upper lobectomy for left upper lobe lung carcinoma, follow-up  CHEST - 2 VIEW  Comparison: CT chest of 04/19/2012 and chest x-ray of 10/09/2011  Findings:  Changes of prior left upper lobectomy are stable.  There is minimal volume loss on the left.  No lung nodule, infiltrate or effusion is seen.  Mediastinal contours are stable.  The heart is within normal limits in size.  No bony abnormality is seen.    IMPRESSION: Stable postoperative changes on the left.  No evidence of recurrence.   Original Report Authenticated By: [name], M.D.    Dg Chest 2 View  05/27/2012  *RADIOLOGY REPORT*  Clinical Data: Prior left upper lobectomy for left upper lobe lung carcinoma, follow-up  CHEST - 2 VIEW  Comparison: CT chest of 04/19/2012 and chest x-ray of 10/09/2011  Findings:  Changes of prior left upper lobectomy are stable.  There is minimal volume loss on the left.  No lung nodule, infiltrate or effusion is seen.  Mediastinal contours are stable.  The heart is within normal limits in size.  No bony abnormality is seen.    IMPRESSION: Stable postoperative changes on the left.  No evidence of recurrence.   Original Report Authenticated By: [name], M.D.   RADIOLOGY REPORT*  Clinical Data: Lung cancer diagnosed 02/2010 post resection,  prostate cancer diagnosed 07/2010 status post resection, right  ocular cancer diagnosed 2009 status post chemotherapy  CT CHEST WITH CONTRAST  Technique: Multidetector CT imaging of the chest was performed  following the standard protocol during bolus administration of  intravenous contrast.  Contrast: 80mL OMNIPAQUE IOHEXOL 300 MG/ML SOLN  Comparison: 07/25/2011  Findings: Status post right upper lobectomy. 1.8 x 0.9 cm  subpleural opacity in the lateral left lower lobe (series  5/image  39), previously 1.6 x 0.8 cm, grossly unchanged from prior studies  since 2011. Additional mild nodularity along the right minor  fissure (series 5/image 28), unchanged. No pleural effusion or  pneumothorax.  Visualized thyroid is unremarkable.  Heart is normal in size. No pericardial effusion. Coronary  atherosclerosis. Atherosclerotic calcifications of the aortic  arch.  No suspicious mediastinal, hilar, or axillary lymphadenopathy.  Visualized upper abdomen is unremarkable.  Mild degenerative changes of the visualized thoracolumbar spine.  IMPRESSION:  Status post right upper lobectomy.  No evidence of metastatic disease in the chest.  1.8 x 0.9 cm subpleural opacity in the lateral left lower lobe,  grossly unchanged.  Original Report Authenticated By: Charline Bills, M.D.   07/25/2011  CT CHEST WITH CONTRAST  Technique: Multidetector CT imaging of the chest was performed  following the standard protocol during bolus administration of  intravenous contrast.  Contrast: 80mL OMNIPAQUE IOHEXOL 300 MG/ML IV SOLN  Comparison: 01/17/2011. PET CT from 01/27/2011.  Findings: There is no axillary lymphadenopathy. A small lymph  nodes are seen in the mediastinum and right hilum. No overt  lymphadenopathy in the chest. Several small lymph nodes along the  distal esophagus are stable. Heart size is normal. Coronary  artery calcification is evident.  8 x 16 mm ill-defined left subpleural nodule measured 10 x 16 mm  previously. This nodule had borderline F D G uptake on recent PET  scan.  Bone windows reveal no worrisome lytic or sclerotic osseous  lesions.  IMPRESSION:  Stable subpleural nodule the left lung in the 17-month interval  since the prior chest CT. No new or progressive disease.  Original Report Authenticated By: [name], M.D.  Recent Labs: Lab Results  Component Value Date   WBC 6.6 04/19/2012   HGB 13.5 04/19/2012   HCT 39.2 04/19/2012   PLT 285 04/19/2012     GLUCOSE 110 04/19/2012   ALT 23 07/19/2010   AST 26 04/19/2012   NA 142 04/19/2012   K 4.2 04/19/2012   CL 106 04/19/2012   CREATININE 1.4* 04/19/2012   BUN 30* 04/19/2012   CO2 24 04/19/2012   INR 0.94 03/19/2010      Assessment / Plan:    The patient is doing well without any significant symptoms working full-time.  His followup CT scan showed postoperative scarring but no evidence of recurrent disease. The ill-defined pleural density on the left is actually decreased in size. Plan to see the patient back in 6 months.  Oncology is getting scan in March 2014 then yearly  after that I will see in one year, with chest xray    [name] MD 05/27/2012 1:47 PM

## 2012-05-27 NOTE — Patient Instructions (Signed)
Ct Scan looks good See back in one year

## 2012-06-21 ENCOUNTER — Encounter (INDEPENDENT_AMBULATORY_CARE_PROVIDER_SITE_OTHER): Payer: Self-pay | Admitting: *Deleted

## 2012-06-23 ENCOUNTER — Telehealth (INDEPENDENT_AMBULATORY_CARE_PROVIDER_SITE_OTHER): Payer: Self-pay | Admitting: *Deleted

## 2012-06-23 ENCOUNTER — Other Ambulatory Visit (INDEPENDENT_AMBULATORY_CARE_PROVIDER_SITE_OTHER): Payer: Self-pay | Admitting: *Deleted

## 2012-06-23 ENCOUNTER — Encounter (INDEPENDENT_AMBULATORY_CARE_PROVIDER_SITE_OTHER): Payer: Self-pay

## 2012-06-23 ENCOUNTER — Encounter (INDEPENDENT_AMBULATORY_CARE_PROVIDER_SITE_OTHER): Payer: Self-pay | Admitting: *Deleted

## 2012-06-23 DIAGNOSIS — Z1211 Encounter for screening for malignant neoplasm of colon: Secondary | ICD-10-CM

## 2012-06-23 DIAGNOSIS — Z86010 Personal history of colonic polyps: Secondary | ICD-10-CM

## 2012-06-23 NOTE — Telephone Encounter (Signed)
Patient needs movi prep 

## 2012-06-24 MED ORDER — PEG-KCL-NACL-NASULF-NA ASC-C 100 G PO SOLR: 1.0000 | Freq: Once | ORAL | Status: DC

## 2012-06-28 ENCOUNTER — Other Ambulatory Visit: Payer: Self-pay | Admitting: Urology

## 2012-07-07 ENCOUNTER — Telehealth (INDEPENDENT_AMBULATORY_CARE_PROVIDER_SITE_OTHER): Payer: Self-pay | Admitting: *Deleted

## 2012-07-07 NOTE — Telephone Encounter (Signed)
PCP/Requesting MD: fagan  Name & DOB: [name] Short 04-30-1946   Procedure: tcs  Reason/Indication:  Hx polyps  Has patient had this procedure before?  yes  If so, when, by whom and where?  3/09 (scanned in EPIC)  Is there a family history of colon cancer?  no  Who?  What age when diagnosed?    Is patient diabetic?   no      Does patient have prosthetic heart valve?  no  Do you have a pacemaker?  no  Has patient had joint replacement within last 12 months?  no  Is patient on Coumadin, Plavix and/or Aspirin? no  Medications: alphagan eye drop daily, losartan 100 mg daily, nifedipine 90 mg daily, omperazole 20 mg daily, tylenol arthritis 650 mg 2 tab in morning, mutl vit  Allergies: nkda  Medication Adjustment:   Procedure date & time: 08/06/12 at 955

## 2012-07-08 NOTE — Telephone Encounter (Signed)
agree

## 2012-07-26 ENCOUNTER — Encounter (HOSPITAL_COMMUNITY): Payer: Self-pay | Admitting: Pharmacy Technician

## 2012-08-05 ENCOUNTER — Encounter (HOSPITAL_COMMUNITY)
Admission: RE | Admit: 2012-08-05 | Discharge: 2012-08-05 | Disposition: A | Discharge status: Home / Self Care | Payer: BC Managed Care – PPO | Source: Ambulatory Visit | Attending: Urology | Admitting: Urology

## 2012-08-05 ENCOUNTER — Encounter (HOSPITAL_COMMUNITY): Payer: Self-pay

## 2012-08-05 HISTORY — DX: Gastro-esophageal reflux disease without esophagitis: K21.9

## 2012-08-05 HISTORY — DX: Male erectile dysfunction, unspecified: N52.9

## 2012-08-05 HISTORY — DX: Unspecified osteoarthritis, unspecified site: M19.90

## 2012-08-05 HISTORY — DX: Pneumonia, unspecified organism: J18.9

## 2012-08-05 HISTORY — DX: Unspecified glaucoma: H40.9

## 2012-08-05 HISTORY — DX: Chronic obstructive pulmonary disease, unspecified: J44.9

## 2012-08-05 HISTORY — DX: Malignant (primary) neoplasm, unspecified: C80.1

## 2012-08-05 HISTORY — DX: Shortness of breath: R06.02

## 2012-08-05 HISTORY — DX: Unspecified cataract: H26.9

## 2012-08-05 LAB — BASIC METABOLIC PANEL
BUN: 21 mg/dL (ref 6–23)
Calcium: 9.5 mg/dL (ref 8.4–10.5)
Chloride: 101 mEq/L (ref 96–112)
Creatinine, Ser: 0.93 mg/dL (ref 0.50–1.35)
GFR calc Af Amer: >90 mL/min (ref >90)

## 2012-08-05 LAB — CBC
HCT: 39.5 % (ref 39.0–52.0)
MCH: 31.2 pg (ref 26.0–34.0)
MCV: 88.6 fL (ref 78.0–100.0)
Platelets: 316 10*3/uL (ref 150–400)
RDW: 12.8 % (ref 11.5–15.5)

## 2012-08-05 MED ORDER — CHLORHEXIDINE GLUCONATE 4 % EX LIQD: Freq: Once | CUTANEOUS | Status: DC

## 2012-08-05 NOTE — Patient Instructions (Signed)
YOUR SURGERY IS SCHEDULED AT [facility]  ON:  Tuesday  11/26  AT 10:00 AM  REPORT TO [facility] AT:  8:00 AM      PHONE # FOR SHORT STAY IS [phone]  DO NOT EAT OR DRINK ANYTHING AFTER MIDNIGHT THE NIGHT BEFORE YOUR SURGERY.  YOU MAY BRUSH YOUR TEETH, RINSE OUT YOUR MOUTH--BUT NO WATER, NO FOOD, NO CHEWING GUM, NO MINTS, NO CANDIES, NO CHEWING TOBACCO.  PLEASE TAKE THE FOLLOWING MEDICATIONS THE AM OF YOUR SURGERY WITH A FEW SIPS OF WATER:  TAKE YOUR LANSOPRAZOLE, NIFEDIPINE.  USE YOUR ALPHAGAN EYE DROPS AND BRING EYE DROPS TO HOSPITAL.   IF YOU USE INHALERS--USE YOUR INHALERS THE AM OF YOUR SURGERY AND BRING INHALERS TO THE HOSPITAL -TAKE TO SURGERY.    IF YOU ARE DIABETIC:  DO NOT TAKE ANY DIABETIC MEDICATIONS THE AM OF YOUR SURGERY.  IF YOU TAKE INSULIN IN THE EVENINGS--PLEASE ONLY TAKE 1/2 NORMAL EVENING DOSE THE NIGHT BEFORE YOUR SURGERY.  NO INSULIN THE AM OF YOUR SURGERY.  IF YOU HAVE SLEEP APNEA AND USE CPAP OR BIPAP--PLEASE BRING THE MASK AND THE TUBING.  DO NOT BRING YOUR MACHINE.  DO NOT BRING VALUABLES, MONEY, CREDIT CARDS.  DO NOT WEAR JEWELRY, MAKE-UP, NAIL POLISH AND NO METAL PINS OR CLIPS IN YOUR HAIR. CONTACT LENS, DENTURES / PARTIALS, GLASSES SHOULD NOT BE WORN TO SURGERY AND IN MOST CASES-HEARING AIDS WILL NEED TO BE REMOVED.  BRING YOUR GLASSES CASE, ANY EQUIPMENT NEEDED FOR YOUR CONTACT LENS. FOR PATIENTS ADMITTED TO THE HOSPITAL--CHECK OUT TIME THE DAY OF DISCHARGE IS 11:00 AM.  ALL INPATIENT ROOMS ARE PRIVATE - WITH BATHROOM, TELEPHONE, TELEVISION AND WIFI INTERNET.  IF YOU ARE BEING DISCHARGED THE SAME DAY OF YOUR SURGERY--YOU CAN NOT DRIVE YOURSELF HOME--AND SHOULD NOT GO HOME ALONE BY TAXI OR BUS.  NO DRIVING OR OPERATING MACHINERY FOR 24 HOURS FOLLOWING ANESTHESIA / PAIN MEDICATIONS.  PLEASE MAKE ARRANGEMENTS FOR SOMEONE TO BE WITH YOU AT HOME THE FIRST 24 HOURS AFTER SURGERY. RESPONSIBLE DRIVER'S NAME___________________________                                         PHONE #   _______________________                                  PLEASE READ OVER ANY  FACT SHEETS THAT YOU WERE GIVEN: MRSA INFORMATION

## 2012-08-05 NOTE — Pre-Procedure Instructions (Signed)
CBC, BMET, EKG WERE DONE TODAY -PREOP AT [facility] AS PER ANESTHESIOLOGIST'S GUIDELINES.  PT HAS CHEST CT REPORT IN EPIC FROM 04/19/12.

## 2012-08-06 ENCOUNTER — Encounter (HOSPITAL_COMMUNITY)
Admission: RE | Disposition: A | Discharge status: Home / Self Care | Payer: Self-pay | Source: Ambulatory Visit | Attending: Internal Medicine

## 2012-08-06 ENCOUNTER — Ambulatory Visit (HOSPITAL_COMMUNITY)
Admission: RE | Admit: 2012-08-06 | Discharge: 2012-08-06 | Disposition: A | Discharge status: Home / Self Care | Payer: BC Managed Care – PPO | Source: Ambulatory Visit | Attending: Internal Medicine | Admitting: Internal Medicine

## 2012-08-06 ENCOUNTER — Encounter (HOSPITAL_COMMUNITY): Payer: Self-pay | Admitting: *Deleted

## 2012-08-06 DIAGNOSIS — D126 Benign neoplasm of colon, unspecified: Secondary | ICD-10-CM | POA: Insufficient documentation

## 2012-08-06 DIAGNOSIS — Z860100 Personal history of colon polyps, unspecified: Secondary | ICD-10-CM | POA: Insufficient documentation

## 2012-08-06 DIAGNOSIS — K644 Residual hemorrhoidal skin tags: Secondary | ICD-10-CM

## 2012-08-06 DIAGNOSIS — J449 Chronic obstructive pulmonary disease, unspecified: Secondary | ICD-10-CM | POA: Insufficient documentation

## 2012-08-06 DIAGNOSIS — J4489 Other specified chronic obstructive pulmonary disease: Secondary | ICD-10-CM | POA: Insufficient documentation

## 2012-08-06 DIAGNOSIS — I10 Essential (primary) hypertension: Secondary | ICD-10-CM | POA: Insufficient documentation

## 2012-08-06 DIAGNOSIS — K648 Other hemorrhoids: Secondary | ICD-10-CM | POA: Insufficient documentation

## 2012-08-06 HISTORY — PX: COLONOSCOPY: SHX5424

## 2012-08-06 SURGERY — COLONOSCOPY
Anesthesia: Moderate Sedation

## 2012-08-06 MED ORDER — MEPERIDINE HCL 50 MG/ML IJ SOLN
INTRAMUSCULAR | Status: DC | PRN
  Administered 2012-08-06 (×2): 25 mg via INTRAVENOUS

## 2012-08-06 MED ORDER — MEPERIDINE HCL 50 MG/ML IJ SOLN
INTRAMUSCULAR | Status: AC
  Filled 2012-08-06: qty 1

## 2012-08-06 MED ORDER — SODIUM CHLORIDE 0.9 % IJ SOLN
INTRAMUSCULAR | Status: DC | PRN
  Administered 2012-08-06: 10 mL

## 2012-08-06 MED ORDER — MIDAZOLAM HCL 5 MG/5ML IJ SOLN
INTRAMUSCULAR | Status: AC
  Filled 2012-08-06: qty 10

## 2012-08-06 MED ORDER — STERILE WATER FOR IRRIGATION IR SOLN
Status: DC | PRN
  Administered 2012-08-06: 10:00:00

## 2012-08-06 MED ORDER — SODIUM CHLORIDE 0.45 % IV SOLN
INTRAVENOUS | Status: DC
  Administered 2012-08-06: 09:00:00 via INTRAVENOUS

## 2012-08-06 MED ORDER — MIDAZOLAM HCL 5 MG/5ML IJ SOLN
INTRAMUSCULAR | Status: DC | PRN
  Administered 2012-08-06 (×3): 2 mg via INTRAVENOUS

## 2012-08-06 NOTE — H&P (Signed)
[name] Short is an 66 y.o. male.   Chief Complaint: Patient is here for colonoscopy. HPI: Asian is 66 year old Caucasian male with history of colonic adenomas. He is here for surveillance colonoscopy. His last colonoscopy was in March 2009 at [facility]. He denies abdominal pain change in his bowel habits or rectal bleeding. History significant for lung and prostate carcinoma for which he had surgery.  Family history is significant for colonic polyps in two siblings.  Past Medical History  Diagnosis Date  . Hypertension   . Shortness of breath     ONLY WITH EXERTION; HX OF LOBECTOMY FOR CANER  . COPD (chronic obstructive pulmonary disease)     FORMER SMOKER  . Pneumonia 2011  . GERD (gastroesophageal reflux disease)   . Arthritis     ESP IN HANDS  . Prostate cancer     prostate ca dx 11/11  . Lung cancer     lung ca dx 02/2010  . Cancer     OCCULAR RIGHT EYE--SURGERY AND CHEMO  PT IS LEGALLY BLIND IN RT EYE  . Erectile dysfunction   . Glaucoma     ONLY IN RIGHT EYE  . Cataract     LEFT EYE    Past Surgical History  Procedure Date  . Eye surgery     OCT 1999 DETACHED RETINA -RIGHT; MAY AND JUNE 2009 RIGHT EYE SURGERY FOR CANCER-LENS HAS BEEN REMOVED.  [name] Left upper lobectomy 03/2010    FOR CANCER  . Prostatectomy DEC 2011    FOR CANCER  . Appendectomy JUNE 2000    Family History  Problem Relation Age of Onset  . Colon polyps Brother   . Colon polyps Brother   . Colon polyps Brother   . Colon polyps Brother    Social History:  reports that he quit smoking about 3 years ago. He quit smokeless tobacco use about 26 years ago. His smokeless tobacco use included Chew. He reports that he does not drink alcohol or use illicit drugs.  Allergies: No Known Allergies  Medications Prior to Admission  Medication Sig Dispense Refill  . acetaminophen (TYLENOL) 650 MG CR tablet Take 650 mg by mouth every 8 (eight) hours as needed.      . brimonidine (ALPHAGAN) 0.15 % ophthalmic solution  Place 1 drop into the right eye 3 (three) times daily.      . ciprofloxacin (CIPRO) 500 MG tablet Take 500 mg by mouth 2 (two) times daily. FOR 7 DAYS--RX FROM DR. WRENN-STARTED 08/04/12      . lansoprazole (PREVACID) 30 MG capsule Take 30 mg by mouth daily.      [name] losartan (COZAAR) 100 MG tablet Take 100 mg by mouth daily.      [name] NIFEdipine (ADALAT CC) 90 MG 24 hr tablet Take 90 mg by mouth daily.      . peg 3350 powder (MOVIPREP) 100 G SOLR Take 1 kit (100 g total) by mouth once.  1 kit  0  . Multiple Vitamin (MULTIVITAMIN) tablet Take 1 tablet by mouth daily.        Results for orders placed during the hospital encounter of 08/05/12 (from the past 48 hour(s))  SURGICAL PCR SCREEN     Status: Normal   Collection Time   08/05/12  9:10 AM      Component Value Range Comment   MRSA, PCR NEGATIVE  NEGATIVE    Staphylococcus aureus NEGATIVE  NEGATIVE   CBC     Status: Normal  Collection Time   08/05/12 10:05 AM      Component Value Range Comment   WBC 6.6  4.0 - 10.5 K/uL    RBC 4.46  4.22 - 5.81 MIL/uL    Hemoglobin 13.9  13.0 - 17.0 g/dL    HCT 08.6  57.8 - 46.9 %    MCV 88.6  78.0 - 100.0 fL    MCH 31.2  26.0 - 34.0 pg    MCHC 35.2  30.0 - 36.0 g/dL    RDW 62.9  52.8 - 41.3 %    Platelets 316  150 - 400 K/uL   BASIC METABOLIC PANEL     Status: Abnormal   Collection Time   08/05/12 10:05 AM      Component Value Range Comment   Sodium 137  135 - 145 mEq/L    Potassium 3.9  3.5 - 5.1 mEq/L    Chloride 101  96 - 112 mEq/L    CO2 23  19 - 32 mEq/L    Glucose, Bld 98  70 - 99 mg/dL    BUN 21  6 - 23 mg/dL    Creatinine, Ser 2.44  0.50 - 1.35 mg/dL    Calcium 9.5  8.4 - 01.0 mg/dL    GFR calc non Af Amer 86 (*) >90 mL/min    GFR calc Af Amer >90  >90 mL/min    No results found.  ROS  Blood pressure 146/85, temperature 97.8 F (36.6 C), temperature source Oral, resp. rate 16, height 5\' 7"  (1.702 m), weight 174 lb (78.926 kg), SpO2 98.00%. Physical Exam  Constitutional: He  appears well-developed and well-nourished.  HENT:  Mouth/Throat: Oropharynx is clear and moist.  Eyes: Conjunctivae normal are normal. No scleral icterus.  Neck: No thyromegaly present.  Cardiovascular: Normal rate, regular rhythm and normal heart sounds.   No murmur heard. Respiratory: Effort normal and breath sounds normal.  GI: Soft. He exhibits no distension and no mass. There is no tenderness.  Musculoskeletal: He exhibits no edema.  Lymphadenopathy:    He has no cervical adenopathy.  Neurological: He is alert.  Skin: Skin is warm and dry.     Assessment/Plan History of colonic adenomas. Surveillance colonoscopy.  [name] Short,[name] Short 08/06/2012, 10:04 AM

## 2012-08-06 NOTE — Op Note (Signed)
COLONOSCOPY PROCEDURE REPORT  PATIENT:  [name] Short  MR#:  [id number] Birthdate:  September 22, 1945, 66 y.o., male Endoscopist:  Dr. Malissa Hippo, MD Referred By:  Dr. Carylon Perches, MD Procedure Date: 08/06/2012  Procedure:   Colonoscopy with snare polypectomy and Hemoclip application.  Indications: Patient is 64 old Caucasian male with history of colonic adenomas. His last exam was in March 2009.  Informed Consent:  The procedure and risks were reviewed with the patient and informed consent was obtained.  Medications:  Demerol 50 mg IV Versed 6 mg IV  Description of procedure:  After a digital rectal exam was performed, that colonoscope was advanced from the anus through the rectum and colon to the area of the cecum, ileocecal valve and appendiceal orifice. The cecum was deeply intubated. These structures were well-seen and photographed for the record. From the level of the cecum and ileocecal valve, the scope was slowly and cautiously withdrawn. The mucosal surfaces were carefully surveyed utilizing scope tip to flexion to facilitate fold flattening as needed. The scope was pulled down into the rectum where a thorough exam including retroflexion was performed.  Findings:   Prep satisfactory. Small cecal polyp and ascending colon polyp was ablated via cold biopsy and submitted together. 10 mm  irregular shaped sessile polyp at ascending colon may the following saline injection. Polypectomy coagulated. Noted from polypectomy site and controlled with single Hemoclip application. 20 x 10 mm sessile polyp snared piecemeal from hepatic flexure. Another 10 mm sessile polyp snared from hepatic flexure. 8 mm sessile polyp snared from splenic flexure. Normal rectal mucosa. Small hemorrhoids above and below the dentate line.  Therapeutic/Diagnostic Maneuvers Performed:  See above  Complications:  None  Cecal Withdrawal Time:  39 minutes  Impression:  Examination performed to cecum. Two small  polyps ablated via cold biopsy(cecum and ascending colon) 10 mm sessile irregular shaped polyp at ascending colon. Polyp elevated with saline injection could not be removed in one piece. Residual polyp coagulated. Hemoclip applied to polypectomy site to control oozing. Two polyps snared from hepatic flexure(20 x 10 mm and 10 mm) Millimeters polyp snared from splenic flexure. Internal/external hemorrhoids  Recommendations:  Standard instructions given. Patient advised not to have MRI until Hemoclip passed. I will contact patient with biopsy results. He should not wait more than 3 years for his next exam.  Leannah Guse U  08/06/2012 11:08 AM  CC: Dr. Carylon Perches, MD & Dr. Bonnetta Barry ref. provider found

## 2012-08-09 ENCOUNTER — Encounter (HOSPITAL_COMMUNITY): Payer: Self-pay | Admitting: Internal Medicine

## 2012-08-09 NOTE — Interval H&P Note (Signed)
History and Physical Interval Note:  08/09/2012 8:10 AM  [name] Short  has presented today for surgery, with the diagnosis of ORGANIC ERECTILE DYSFUNCTION  The various methods of treatment have been discussed with the patient and family. After consideration of risks, benefits and other options for treatment, the patient has consented to  Procedure(s) (LRB) with comments: PENILE PROTHESIS INFLATABLE (N/A) - IMPLANT 3 PIECE PENILE PROSTHESIS  as a surgical intervention .  The patient's history has been reviewed, patient examined, no change in status, stable for surgery.  I have reviewed the patient's chart and labs.  Questions were answered to the patient's satisfaction.     [name] Short  His urine culture on 11/20 grew a pansensitive e. Coli and he was placed on cipro on 11/20.   He should be appropriately covered for his prosthesis placement on 11/26.

## 2012-08-09 NOTE — H&P (Signed)
ctive Problems Problems  1. Male Stress Incontinence 788.32 2. Muscle Weakness 728.87 3. Organic Impotence 607.84 4. History of  Prostate Cancer V10.46 5. Pyuria 791.9 6. Spermatocele Right 608.1  History of Present Illness     [name] Short returns today for f/u for a preop visit for his up coming IPP implant.   He has had no dysuria but his UA today has WBC's.  He has a history of T2c Gleason 6 prostate cancer treated with RALP on 09/11/10 and has had profound ED since.   His PSA in June was 0.01.   He is doing well with his continence but he has to wear a pad when he is working and lifting which is unchanged and he just uses one pad daily.   He has a good stream and no hematuria.  He has pyuria and bacteuria on his UA today but no UTI symptoms.   Past Medical History Problems  1. History of  Abrasion Of The Right Cornea 918.1 2. History of  Arthritis V13.4 3. History of  Benign Prostatic Hypertrophy With Urinary Obstruction 600.01 4. History of  Eye Trauma 921.9 5. History of  Heartburn With Regurgitation 787.1 6. History of  Lung Cancer V10.11 7. History of  Plantar Fasciitis 728.71 8. History of  Prostate Cancer V10.46 9. History of  PSA,Elevated 790.93 10. History of  Testicular Pain Left 608.9  Surgical History Problems  1. History of  Appendectomy 2. History of  Eye Surgery 3. History of  Lung Surgery 4. History of  Prostatectomy Robotic-Assisted  Current Meds 1. Alphagan P SOLN; Therapy: (Recorded:20Feb2009) to 2. Losartan Potassium 100 MG Oral Tablet; Therapy: (Recorded:17Feb2012) to 3. NIFEdipine ER Osmotic 90 MG Oral Tablet Extended Release 24 Hour; Therapy:  (Recorded:14Dec2012) to 4. Omeprazole 20 MG Oral Tablet Delayed Release; Therapy: (Recorded:20Feb2009) to 5. Tylenol Arthritis Pain TBCR; Therapy: (Recorded:20Feb2009) to  Allergies Medication  1. Silicone LIQD  Family History Problems  1. Fraternal history of  Arthritis V17.7 2. Daughter's history of   Arthritis V17.7 3. Family history of  Family Health Status - Father's Age Died at 1 killed in car accident. 4. Family history of  Family Health Status - Mother's Age Died 43 from lung cancer. 5. Family history of  Family Health Status Number Of Children 2 daughters 6. Maternal history of  Lung Cancer V16.1 7. Fraternal history of  Nephrolithiasis 8. Fraternal history of  Nephrolithiasis 9. Fraternal history of  Nephrolithiasis 10. Fraternal history of  Nephrolithiasis  Social History Problems  1. Activities Of Daily Living 2. Caffeine Use 8 cups of coffee a day previously but now down to 1. 3. 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 4. Former Smoker 5. Living Independently With Spouse 6. Marital History - Currently Married 7. Occupation: Personnel officer 8. Self-reliant In Usual Daily Activities 9. Tobacco Use V15.82 1/2 pack a day but quit over 2 years ago. Denied  10. History of  Alcohol Use   Past, family and social history reviewed and updated.   Review of Systems Genitourinary, constitutional, skin, eye, otolaryngeal, hematologic/lymphatic, cardiovascular, pulmonary, endocrine, musculoskeletal, gastrointestinal, neurological and psychiatric system(s) were reviewed and pertinent findings if present are noted.  Genitourinary: no urinary frequency, no dysuria and no nocturia.  Gastrointestinal: no diarrhea and no constipation.  Constitutional: no fever and no recent weight loss.  Cardiovascular: no chest pain.  Respiratory: no shortness of breath.    Vitals Vital Signs [Data Includes: Last 1 Day]  20Nov2013 11:31AM  Blood Pressure: 162 / 86 Temperature: 97.2 F Heart Rate: 60  Physical Exam Constitutional: Well nourished and well developed . No acute distress.  ENT:. The ears and nose are normal in appearance.  Neck: The appearance of the neck is normal and no neck mass is present.  Pulmonary: No respiratory distress and  normal respiratory rhythm and effort.  Cardiovascular: Heart rate and rhythm are normal . No peripheral edema.  Abdomen: The abdomen is soft and nontender. No masses are palpated. No CVA tenderness. No hernias are palpable. No hepatosplenomegaly noted.  Skin: Normal skin turgor, no visible rash and no visible skin lesions.  Neuro/Psych:. Mood and affect are appropriate.    Results/Data Urine [Data Includes: Last 1 Day]   20Nov2013  COLOR YELLOW   APPEARANCE CLEAR   SPECIFIC GRAVITY >1.030   pH 6.0   GLUCOSE NEG mg/dL  BILIRUBIN NEG   KETONE TRACE mg/dL  BLOOD NEG   PROTEIN TRACE mg/dL  UROBILINOGEN 0.2 mg/dL  NITRITE NEG   LEUKOCYTE ESTERASE SMALL   SQUAMOUS EPITHELIAL/HPF RARE   WBC 21-50 WBC/hpf  RBC 0-2 RBC/hpf  BACTERIA MODERATE   CRYSTALS NONE SEEN   CASTS NONE SEEN   Other MUCUS    Assessment Assessed  1. Pyuria 791.9 2. Organic Impotence 607.84 3. History of  Prostate Cancer V10.46   He is for placement of a coloplast IPP on 11/26 and is ready for surgery but he may have a UTI. He had noted a silicone "allergy", but silicone is inert and not associated with allergy.  I spoke to the ophthamologist who placed and removed this patient's silicone buckle and he stated that the silicone is not the issue.   Plan Health Maintenance (V70.0)  1. UA With REFLEX  Done: 20Nov2013 11:23AM Pyuria (791.9)  2. Ciprofloxacin HCl 500 MG Oral Tablet; Take 1 tablet twice daily; Therapy: 20Nov2013 to  (Evaluate:27Nov2013)  Requested for: 20Nov2013; Last Rx:20Nov2013; Edited 3. URINE CULTURE  Done: 20Nov2013   Urine culture today. Cipro 500mg  po bid #14 pending the culture. I will proceed with placement of the IPP after insuring he is on appropriate anitbiotic coverage as needed. He had no further questions about the procedure.   Discussion/Summary    CC: Dr. Carylon Perches and Dr. Arlis Porta.

## 2012-08-10 ENCOUNTER — Encounter (HOSPITAL_COMMUNITY): Payer: Self-pay | Admitting: Anesthesiology

## 2012-08-10 ENCOUNTER — Ambulatory Visit (HOSPITAL_COMMUNITY)
Admission: RE | Admit: 2012-08-10 | Discharge: 2012-08-11 | Disposition: A | Discharge status: Home / Self Care | Payer: BC Managed Care – PPO | Source: Ambulatory Visit | Attending: Urology | Admitting: Urology

## 2012-08-10 ENCOUNTER — Encounter (HOSPITAL_COMMUNITY): Payer: Self-pay | Admitting: *Deleted

## 2012-08-10 ENCOUNTER — Encounter (HOSPITAL_COMMUNITY)
Admission: RE | Disposition: A | Discharge status: Home / Self Care | Payer: Self-pay | Source: Ambulatory Visit | Attending: Urology

## 2012-08-10 ENCOUNTER — Ambulatory Visit (HOSPITAL_COMMUNITY): Payer: BC Managed Care – PPO | Admitting: Anesthesiology

## 2012-08-10 DIAGNOSIS — Z8546 Personal history of malignant neoplasm of prostate: Secondary | ICD-10-CM | POA: Insufficient documentation

## 2012-08-10 DIAGNOSIS — Z01810 Encounter for preprocedural cardiovascular examination: Secondary | ICD-10-CM | POA: Insufficient documentation

## 2012-08-10 DIAGNOSIS — Z9079 Acquired absence of other genital organ(s): Secondary | ICD-10-CM | POA: Insufficient documentation

## 2012-08-10 DIAGNOSIS — Z79899 Other long term (current) drug therapy: Secondary | ICD-10-CM | POA: Insufficient documentation

## 2012-08-10 DIAGNOSIS — K219 Gastro-esophageal reflux disease without esophagitis: Secondary | ICD-10-CM | POA: Insufficient documentation

## 2012-08-10 DIAGNOSIS — J4489 Other specified chronic obstructive pulmonary disease: Secondary | ICD-10-CM | POA: Insufficient documentation

## 2012-08-10 DIAGNOSIS — I10 Essential (primary) hypertension: Secondary | ICD-10-CM | POA: Insufficient documentation

## 2012-08-10 DIAGNOSIS — R82998 Other abnormal findings in urine: Secondary | ICD-10-CM | POA: Insufficient documentation

## 2012-08-10 DIAGNOSIS — J449 Chronic obstructive pulmonary disease, unspecified: Secondary | ICD-10-CM | POA: Insufficient documentation

## 2012-08-10 DIAGNOSIS — N529 Male erectile dysfunction, unspecified: Secondary | ICD-10-CM | POA: Diagnosis present

## 2012-08-10 DIAGNOSIS — N4340 Spermatocele of epididymis, unspecified: Secondary | ICD-10-CM | POA: Insufficient documentation

## 2012-08-10 DIAGNOSIS — M6281 Muscle weakness (generalized): Secondary | ICD-10-CM | POA: Insufficient documentation

## 2012-08-10 DIAGNOSIS — Z23 Encounter for immunization: Secondary | ICD-10-CM | POA: Insufficient documentation

## 2012-08-10 DIAGNOSIS — N393 Stress incontinence (female) (male): Secondary | ICD-10-CM | POA: Insufficient documentation

## 2012-08-10 DIAGNOSIS — Z01812 Encounter for preprocedural laboratory examination: Secondary | ICD-10-CM | POA: Insufficient documentation

## 2012-08-10 HISTORY — PX: PENILE PROSTHESIS IMPLANT: SHX240

## 2012-08-10 SURGERY — INSERTION, PENILE PROSTHESIS, INFLATABLE
Anesthesia: General | Site: Penis | Wound class: Clean Contaminated

## 2012-08-10 MED ORDER — ACETAMINOPHEN 10 MG/ML IV SOLN
INTRAVENOUS | Status: DC | PRN
  Administered 2012-08-10: 1000 mg via INTRAVENOUS

## 2012-08-10 MED ORDER — ROCURONIUM BROMIDE 100 MG/10ML IV SOLN
INTRAVENOUS | Status: DC | PRN
  Administered 2012-08-10: 40 mg via INTRAVENOUS
  Administered 2012-08-10 (×2): 10 mg via INTRAVENOUS

## 2012-08-10 MED ORDER — MIDAZOLAM HCL 5 MG/5ML IJ SOLN
INTRAMUSCULAR | Status: DC | PRN
  Administered 2012-08-10: 2 mg via INTRAVENOUS

## 2012-08-10 MED ORDER — HYDROMORPHONE HCL PF 1 MG/ML IJ SOLN: 0.2500 mg | INTRAMUSCULAR | Status: DC | PRN

## 2012-08-10 MED ORDER — HYDROMORPHONE HCL PF 1 MG/ML IJ SOLN
0.5000 mg | INTRAMUSCULAR | Status: DC | PRN
  Administered 2012-08-10 – 2012-08-11 (×5): 1 mg via INTRAVENOUS
  Filled 2012-08-10 (×5): qty 1

## 2012-08-10 MED ORDER — EPHEDRINE SULFATE 50 MG/ML IJ SOLN
INTRAMUSCULAR | Status: DC | PRN
  Administered 2012-08-10: 10 mg via INTRAVENOUS
  Administered 2012-08-10: 5 mg via INTRAVENOUS

## 2012-08-10 MED ORDER — BRIMONIDINE TARTRATE 0.15 % OP SOLN
1.0000 [drp] | Freq: Three times a day (TID) | OPHTHALMIC | Status: DC
  Administered 2012-08-10 – 2012-08-11 (×2): 1 [drp] via OPHTHALMIC
  Filled 2012-08-10: qty 5

## 2012-08-10 MED ORDER — ACETAMINOPHEN 10 MG/ML IV SOLN
INTRAVENOUS | Status: AC
  Filled 2012-08-10: qty 100

## 2012-08-10 MED ORDER — ZOLPIDEM TARTRATE 5 MG PO TABS: 5.0000 mg | ORAL_TABLET | Freq: Every evening | ORAL | Status: DC | PRN

## 2012-08-10 MED ORDER — CIPROFLOXACIN HCL 500 MG PO TABS
500.0000 mg | ORAL_TABLET | Freq: Two times a day (BID) | ORAL | Status: DC
  Administered 2012-08-10 – 2012-08-11 (×2): 500 mg via ORAL
  Filled 2012-08-10 (×4): qty 1

## 2012-08-10 MED ORDER — LACTATED RINGERS IV SOLN
INTRAVENOUS | Status: DC | PRN
  Administered 2012-08-10 (×3): via INTRAVENOUS

## 2012-08-10 MED ORDER — BISACODYL 10 MG RE SUPP
10.0000 mg | Freq: Every day | RECTAL | Status: DC | PRN
  Filled 2012-08-10: qty 1

## 2012-08-10 MED ORDER — LIDOCAINE HCL (CARDIAC) 20 MG/ML IV SOLN
INTRAVENOUS | Status: DC | PRN
  Administered 2012-08-10: 50 mg via INTRAVENOUS

## 2012-08-10 MED ORDER — CEFAZOLIN SODIUM-DEXTROSE 2-3 GM-% IV SOLR
INTRAVENOUS | Status: DC | PRN
  Administered 2012-08-10: 2 g via INTRAVENOUS

## 2012-08-10 MED ORDER — SODIUM CHLORIDE 0.9 % IR SOLN
Status: DC | PRN
  Administered 2012-08-10: 11:00:00

## 2012-08-10 MED ORDER — PNEUMOCOCCAL VAC POLYVALENT 25 MCG/0.5ML IJ INJ
0.5000 mL | INJECTION | INTRAMUSCULAR | Status: AC
  Administered 2012-08-11: 0.5 mL via INTRAMUSCULAR
  Filled 2012-08-10: qty 0.5

## 2012-08-10 MED ORDER — ONDANSETRON HCL 4 MG/2ML IJ SOLN
INTRAMUSCULAR | Status: DC | PRN
  Administered 2012-08-10: 4 mg via INTRAVENOUS

## 2012-08-10 MED ORDER — GLYCOPYRROLATE 0.2 MG/ML IJ SOLN
INTRAMUSCULAR | Status: DC | PRN
  Administered 2012-08-10: 0.6 mg via INTRAVENOUS

## 2012-08-10 MED ORDER — PROMETHAZINE HCL 25 MG/ML IJ SOLN: 6.2500 mg | INTRAMUSCULAR | Status: DC | PRN

## 2012-08-10 MED ORDER — VANCOMYCIN HCL IN DEXTROSE 1-5 GM/200ML-% IV SOLN
1000.0000 mg | Freq: Once | INTRAVENOUS | Status: AC
  Administered 2012-08-10: 1000 mg via INTRAVENOUS
  Filled 2012-08-10: qty 200

## 2012-08-10 MED ORDER — DOCUSATE SODIUM 100 MG PO CAPS
100.0000 mg | ORAL_CAPSULE | Freq: Two times a day (BID) | ORAL | Status: DC
  Administered 2012-08-10 – 2012-08-11 (×3): 100 mg via ORAL
  Filled 2012-08-10 (×5): qty 1

## 2012-08-10 MED ORDER — NIFEDIPINE ER OSMOTIC RELEASE 90 MG PO TB24
90.0000 mg | ORAL_TABLET | Freq: Every day | ORAL | Status: DC
  Administered 2012-08-11: 90 mg via ORAL
  Filled 2012-08-10 (×2): qty 1

## 2012-08-10 MED ORDER — PANTOPRAZOLE SODIUM 20 MG PO TBEC
20.0000 mg | DELAYED_RELEASE_TABLET | Freq: Every day | ORAL | Status: DC
  Administered 2012-08-11: 20 mg via ORAL
  Filled 2012-08-10 (×2): qty 1

## 2012-08-10 MED ORDER — MEPERIDINE HCL 50 MG/ML IJ SOLN: 6.2500 mg | INTRAMUSCULAR | Status: DC | PRN

## 2012-08-10 MED ORDER — OXYCODONE HCL 5 MG PO TABS: 5.0000 mg | ORAL_TABLET | Freq: Once | ORAL | Status: DC | PRN

## 2012-08-10 MED ORDER — FENTANYL CITRATE 0.05 MG/ML IJ SOLN
INTRAMUSCULAR | Status: DC | PRN
  Administered 2012-08-10: 50 ug via INTRAVENOUS
  Administered 2012-08-10: 100 ug via INTRAVENOUS
  Administered 2012-08-10: 50 ug via INTRAVENOUS
  Administered 2012-08-10: 100 ug via INTRAVENOUS
  Administered 2012-08-10: 50 ug via INTRAVENOUS

## 2012-08-10 MED ORDER — PROPOFOL 10 MG/ML IV BOLUS
INTRAVENOUS | Status: DC | PRN
  Administered 2012-08-10: 170 mg via INTRAVENOUS

## 2012-08-10 MED ORDER — DEXAMETHASONE SODIUM PHOSPHATE 10 MG/ML IJ SOLN
INTRAMUSCULAR | Status: DC | PRN
  Administered 2012-08-10: 10 mg via INTRAVENOUS

## 2012-08-10 MED ORDER — NEOSTIGMINE METHYLSULFATE 1 MG/ML IJ SOLN
INTRAMUSCULAR | Status: DC | PRN
  Administered 2012-08-10: 4 mg via INTRAVENOUS

## 2012-08-10 MED ORDER — ACETAMINOPHEN 10 MG/ML IV SOLN
1000.0000 mg | Freq: Four times a day (QID) | INTRAVENOUS | Status: AC
  Administered 2012-08-10 – 2012-08-11 (×4): 1000 mg via INTRAVENOUS
  Filled 2012-08-10 (×4): qty 100

## 2012-08-10 MED ORDER — OXYCODONE HCL 5 MG/5ML PO SOLN: 5.0000 mg | Freq: Once | ORAL | Status: DC | PRN

## 2012-08-10 MED ORDER — LOSARTAN POTASSIUM 50 MG PO TABS
100.0000 mg | ORAL_TABLET | Freq: Every day | ORAL | Status: DC
  Administered 2012-08-11: 100 mg via ORAL
  Filled 2012-08-10 (×2): qty 2

## 2012-08-10 MED ORDER — CEFAZOLIN SODIUM-DEXTROSE 2-3 GM-% IV SOLR
INTRAVENOUS | Status: AC
  Filled 2012-08-10: qty 50

## 2012-08-10 MED ORDER — ACETAMINOPHEN 10 MG/ML IV SOLN: 1000.0000 mg | Freq: Once | INTRAVENOUS | Status: DC | PRN

## 2012-08-10 MED ORDER — SUCCINYLCHOLINE CHLORIDE 20 MG/ML IJ SOLN
INTRAMUSCULAR | Status: DC | PRN
  Administered 2012-08-10: 100 mg via INTRAVENOUS

## 2012-08-10 MED ORDER — ACETAMINOPHEN 325 MG PO TABS: 650.0000 mg | ORAL_TABLET | ORAL | Status: DC | PRN

## 2012-08-10 MED ORDER — KCL IN DEXTROSE-NACL 20-5-0.45 MEQ/L-%-% IV SOLN
INTRAVENOUS | Status: DC
  Administered 2012-08-10: 15:00:00 via INTRAVENOUS
  Filled 2012-08-10 (×3): qty 1000

## 2012-08-10 MED ORDER — HYOSCYAMINE SULFATE 0.125 MG SL SUBL
0.1250 mg | SUBLINGUAL_TABLET | SUBLINGUAL | Status: DC | PRN
  Filled 2012-08-10: qty 1

## 2012-08-10 MED ORDER — OXYCODONE-ACETAMINOPHEN 5-325 MG PO TABS
1.0000 | ORAL_TABLET | ORAL | Status: DC | PRN
  Filled 2012-08-10: qty 2

## 2012-08-10 MED ORDER — ONDANSETRON HCL 4 MG/2ML IJ SOLN: 4.0000 mg | INTRAMUSCULAR | Status: DC | PRN

## 2012-08-10 MED ORDER — INFLUENZA VIRUS VACC SPLIT PF IM SUSP
0.5000 mL | INTRAMUSCULAR | Status: AC
  Administered 2012-08-11: 0.5 mL via INTRAMUSCULAR
  Filled 2012-08-10: qty 0.5

## 2012-08-10 SURGICAL SUPPLY — 48 items
ADH SKN CLS APL DERMABOND .7 (GAUZE/BANDAGES/DRESSINGS) ×1
APL SKNCLS STERI-STRIP NONHPOA (GAUZE/BANDAGES/DRESSINGS) ×1
BAG URINE DRAINAGE (UROLOGICAL SUPPLIES) ×2 IMPLANT
BANDAGE COBAN STERILE 2 (GAUZE/BANDAGES/DRESSINGS) IMPLANT
BENZOIN TINCTURE PRP APPL 2/3 (GAUZE/BANDAGES/DRESSINGS) ×2 IMPLANT
BLADE HEX COATED 2.75 (ELECTRODE) ×2 IMPLANT
BLADE SURG 15 STRL LF DISP TIS (BLADE) ×2 IMPLANT
BLADE SURG 15 STRL SS (BLADE) ×2
CANISTER SUCTION 2500CC (MISCELLANEOUS) ×2 IMPLANT
CATH FOLEY 2WAY SLVR  5CC 16FR (CATHETERS) ×1
CATH FOLEY 2WAY SLVR 5CC 16FR (CATHETERS) ×1 IMPLANT
CLOTH BEACON ORANGE TIMEOUT ST (SAFETY) ×2 IMPLANT
COVER MAYO STAND STRL (DRAPES) ×2 IMPLANT
COVER SURGICAL LIGHT HANDLE (MISCELLANEOUS) ×2 IMPLANT
CYL ST 20CM INFRAPUB OTR 0 (KITS) ×1 IMPLANT
DERMABOND ADVANCED (GAUZE/BANDAGES/DRESSINGS) ×1
DERMABOND ADVANCED .7 DNX12 (GAUZE/BANDAGES/DRESSINGS) IMPLANT
DISSECTOR ROUND CHERRY 3/8 STR (MISCELLANEOUS) ×2 IMPLANT
DRAPE LAPAROTOMY T 102X78X121 (DRAPES) ×2 IMPLANT
DRAPE UTILITY XL STRL (DRAPES) ×1 IMPLANT
DRSG TEGADERM 4X4.75 (GAUZE/BANDAGES/DRESSINGS) ×1 IMPLANT
ELECT REM PT RETURN 9FT ADLT (ELECTROSURGICAL) ×4
ELECTRODE REM PT RTRN 9FT ADLT (ELECTROSURGICAL) ×1 IMPLANT
GLOVE SURG SS PI 8.0 STRL IVOR (GLOVE) ×2 IMPLANT
GOWN STRL REIN XL XLG (GOWN DISPOSABLE) ×3 IMPLANT
KIT BASIN OR (CUSTOM PROCEDURE TRAY) ×2 IMPLANT
LUBRICANT JELLY ST 5GR 8946 (MISCELLANEOUS) ×5 IMPLANT
NS IRRIG 1000ML POUR BTL (IV SOLUTION) ×2 IMPLANT
PACK GENERAL/GYN (CUSTOM PROCEDURE TRAY) ×2 IMPLANT
PLUG CATH AND CAP STER (CATHETERS) ×2 IMPLANT
RETRACTOR WILSON SYSTEM (INSTRUMENTS) IMPLANT
SOL PREP POV-IOD 16OZ 10% (MISCELLANEOUS) ×2 IMPLANT
SOL PREP PROV IODINE SCRUB 4OZ (MISCELLANEOUS) ×2 IMPLANT
SPONGE GAUZE 4X4 12PLY (GAUZE/BANDAGES/DRESSINGS) IMPLANT
SPONGE LAP 4X18 X RAY DECT (DISPOSABLE) IMPLANT
STRIP CLOSURE SKIN 1/2X4 (GAUZE/BANDAGES/DRESSINGS) ×1 IMPLANT
SUPPORT SCROTAL LG STRP (MISCELLANEOUS) ×2 IMPLANT
SUT CHROMIC 3 0 SH 27 (SUTURE) ×3 IMPLANT
SUT PDS AB 2-0 CT2 27 (SUTURE) ×6 IMPLANT
SUT VIC AB 2-0 UR5 27 (SUTURE) ×2 IMPLANT
SUT VIC AB 2-0 UR6 27 (SUTURE) ×2 IMPLANT
SYR 20CC LL (SYRINGE) ×2 IMPLANT
SYR 50ML LL SCALE MARK (SYRINGE) ×4 IMPLANT
TOWEL OR 17X26 10 PK STRL BLUE (TOWEL DISPOSABLE) ×2 IMPLANT
WATER STERILE IRR 1500ML POUR (IV SOLUTION) ×2 IMPLANT
titan assembly kit ×1 IMPLANT
titan infrapubic 20cm ×1 IMPLANT
titan reservoir ×1 IMPLANT

## 2012-08-10 NOTE — Interval H&P Note (Signed)
History and Physical Interval Note:  08/10/2012 9:49 AM  [name] Short  has presented today for surgery, with the diagnosis of ORGANIC ERECTILE DYSFUNCTION  The various methods of treatment have been discussed with the patient and family. After consideration of risks, benefits and other options for treatment, the patient has consented to  Procedure(s) (LRB) with comments: PENILE PROTHESIS INFLATABLE (N/A) - IMPLANT 3 PIECE PENILE PROSTHESIS  as a surgical intervention .  The patient's history has been reviewed, patient examined, no change in status, stable for surgery.  I have reviewed the patient's chart and labs.  Questions were answered to the patient's satisfaction.     Chrishonda Hesch J

## 2012-08-10 NOTE — Progress Notes (Signed)
Patient ID: [name] Short, male   DOB: Mar 28, 1946, 66 y.o.   MRN: [id number]   Patient is hungry. Tolerated clears. Has some mild - moderate pain.   PE: 08/10/12 1235 97.3 F (36.3 C) -- 72 16 ! 141/85 mmHg 99 % Nasal cannula  A&O x3 NAD Penis - implant partially inflated. Penis looks healthy and viable. Minimal bruising. No necrosis. Foley in place. Urine clear.  Ext - SCD's in placed.   Imp - stable post-op IPP placement  Plan - Pain meds prn Advance diet

## 2012-08-10 NOTE — Anesthesia Preprocedure Evaluation (Addendum)
Anesthesia Evaluation  Patient identified by MRN, date of birth, ID band Patient awake    Reviewed: Allergy & Precautions, H&P , NPO status , Patient's Chart, lab work & pertinent test results  Airway Mallampati: II TM Distance: >3 FB Neck ROM: Full    Dental  (+) Dental Advisory Given and Teeth Intact   Pulmonary shortness of breath and with exertion, pneumonia -, resolved, COPD COPD inhaler,  breath sounds clear to auscultation  Pulmonary exam normal       Cardiovascular hypertension, Pt. on medications Rhythm:Regular Rate:Normal     Neuro/Psych PSYCHIATRIC DISORDERS    GI/Hepatic GERD-  Medicated,  Endo/Other    Renal/GU      Musculoskeletal   Abdominal   Peds  Hematology   Anesthesia Other Findings   Reproductive/Obstetrics                          Anesthesia Physical Anesthesia Plan  ASA: III  Anesthesia Plan: General   Post-op Pain Management:    Induction: Intravenous  Airway Management Planned: LMA  Additional Equipment:   Intra-op Plan:   Post-operative Plan: Extubation in OR  Informed Consent: I have reviewed the patients History and Physical, chart, labs and discussed the procedure including the risks, benefits and alternatives for the proposed anesthesia with the patient or authorized representative who has indicated his/her understanding and acceptance.   Dental advisory given  Plan Discussed with: CRNA  Anesthesia Plan Comments:         Anesthesia Quick Evaluation

## 2012-08-10 NOTE — Transfer of Care (Signed)
Immediate Anesthesia Transfer of Care Note  Patient: [name] Short  Procedure(s) Performed: Procedure(s) (LRB) with comments: PENILE PROTHESIS INFLATABLE (N/A) - IMPLANT 3 PIECE PENILE PROSTHESIS   Patient Location: PACU  Anesthesia Type:General  Level of Consciousness: sedated  Airway & Oxygen Therapy: Patient Spontanous Breathing and Patient connected to face mask oxygen  Post-op Assessment: Report given to PACU RN and Post -op Vital signs reviewed and stable  Post vital signs: Reviewed and stable  Complications: No apparent anesthesia complications

## 2012-08-10 NOTE — Anesthesia Postprocedure Evaluation (Signed)
Anesthesia Post Note  Patient: [name] Short  Procedure(s) Performed: Procedure(s) (LRB): PENILE PROTHESIS INFLATABLE (N/A)  Anesthesia type: General  Patient location: PACU  Post pain: Pain level controlled  Post assessment: Post-op Vital signs reviewed  Last Vitals: BP 141/85  Pulse 72  Temp 36.3 C (Oral)  Resp 16  Ht 5\' 7"  (1.702 m)  Wt 174 lb (78.926 kg)  BMI 27.25 kg/m2  SpO2 99%  Post vital signs: Reviewed  Level of consciousness: sedated  Complications: No apparent anesthesia complications

## 2012-08-10 NOTE — Brief Op Note (Signed)
08/10/2012  11:55 AM  PATIENT:  Al Corpus  66 y.o. male  PRE-OPERATIVE DIAGNOSIS:  ORGANIC ERECTILE DYSFUNCTION  POST-OPERATIVE DIAGNOSIS:  organic erectile dysfunction  PROCEDURE:  Procedure(s) (LRB) with comments: PENILE PROTHESIS INFLATABLE (N/A) - IMPLANT 3 PIECE PENILE PROSTHESIS   SURGEON:  Surgeon(s) and Role:    * Anner Crete, MD - Primary    * Antony Haste, MD - Assisting  PHYSICIAN ASSISTANT:   ASSISTANTS: Dr. Karma Greaser   ANESTHESIA:   general  EBL:  Total I/O In: 2400 [I.V.:2400] Out: -   BLOOD ADMINISTERED:none  DRAINS: Urinary Catheter (Foley)   LOCAL MEDICATIONS USED:  NONE  SPECIMEN:  No Specimen  DISPOSITION OF SPECIMEN:  N/A  COUNTS:  YES  TOURNIQUET:  * No tourniquets in log *  DICTATION: .Other Dictation: Dictation Number [id number]  PLAN OF CARE: Admit for overnight observation  PATIENT DISPOSITION:  PACU - hemodynamically stable.   Delay start of Pharmacological VTE agent (>24hrs) due to surgical blood loss or risk of bleeding: not applicable

## 2012-08-11 ENCOUNTER — Encounter (HOSPITAL_COMMUNITY): Payer: Self-pay | Admitting: Urology

## 2012-08-11 DIAGNOSIS — N529 Male erectile dysfunction, unspecified: Secondary | ICD-10-CM | POA: Diagnosis present

## 2012-08-11 MED ORDER — LEVOFLOXACIN 500 MG PO TABS: 500.0000 mg | ORAL_TABLET | Freq: Every day | ORAL | Status: DC

## 2012-08-11 MED ORDER — OXYCODONE-ACETAMINOPHEN 5-325 MG PO TABS: 1.0000 | ORAL_TABLET | ORAL | Status: DC | PRN

## 2012-08-11 MED FILL — Bupivacaine HCl Preservative Free (PF) Inj 0.25%: INTRAMUSCULAR | Qty: 30 | Status: AC

## 2012-08-11 NOTE — Op Note (Signed)
[name], [name] Short                 ACCOUNT NO.:  [id number]  MEDICAL RECORD NO.:  [id number]  LOCATION:  1404                         FACILITY:  [facility]  PHYSICIAN:  Excell Seltzer. Annabell Howells, M.D.    DATE OF BIRTH:  [birth year]  DATE OF PROCEDURE:  08/10/2012 DATE OF DISCHARGE:                              OPERATIVE REPORT   PROCEDURE:  Implantation of a multi-component penile prosthesis.  PREOPERATIVE DIAGNOSIS:  Organic erectile dysfunction.  POSTOPERATIVE DIAGNOSIS:  Organic erectile dysfunction.  SURGEON:  Excell Seltzer. Annabell Howells, MD  ASSISTANT:  Jerilee Field, MD  ANESTHESIA:  General.  SPECIMEN:  None.  DRAIN:  A 16-French Foley catheter.  BLOOD LOSS:  100 mL.  COMPLICATIONS:  None.  INDICATIONS:  [name] Short is a 66 year old white male with a history of prostate cancer post prostatectomy has persistent erectile dysfunction and has failed conservative measures and has elected to undergo penile prosthesis placement for treatment.  FINDINGS OF PROCEDURE:  A week prior to surgery, he was seen and his urine had evidence of urinary tract infection.  This was cultured and he was placed on Cipro.  He did have an E. coli, sensitive to Cipro which he remained on at the time of surgery.  He was taken to the operating room where he was placed in the supine position and general anesthetic was induced.  His lower abdomen and genitalia were clipped.  He was given 2 g of Ancef.  He was fitted with PAS hose.  His lower abdomen and perigenital area were prepped with ChloraPrep.  The genitalia were prepped with Betadine solution.  He was draped in the usual sterile fashion.  At this point, a 16-French Foley catheter was inserted.  The balloon was filled with 10 mL of sterile fluid.  The bladder was drained and the catheter was plugged.  The skin incision was then marked over the pubis and a 5 cm skin incision was made with a knife.  This was carried down through the dermis and subcutaneous tissue  with the Bovie exposing the anterior rectus fascia above the pubis.  The anterior rectus fascia was opened vertically, approximately 2-3 cm in length.  A plane was then developed between the muscle and transversalis fascia.  He had had a prior robotic prostatectomy, so I wanted to stay out of the retropubic space.  Once this plane had been sufficiently developed using a finger and a small Deaver retractor, a Coloplast 125 mL Titan reservoir which had been prepped with irrigation with double antibiotic solution.  He was then placed.  Once in position, the anterior rectus fascia was closed with interrupted 2-0 Vicryl sutures.  At this point, the reservoir was filled with 70 mL of fluid and then drained and rubber shod clamp was placed on the tubing.  We then placed the 21 butterfly into the left corpora and performed an artificial erection with saline to aid exposure of the corpora.  The butterfly needle was removed and the dissection was then carried below the pubis with care being taken to avoid the midline suspensory ligament and neurovascular structures.  The left corpora was identified and exposed.  Stay sutures of 2-0 Vicryl were  then placed.  We then exposed the right corpora superolaterally and placed stay sutures.  The pouch for the pump was then created subcutaneously in the right scrotum with care being taken to stay just below the skin.  At this point, the left corpora was opened approximately a cm and a half using 15 blade and a single pass of Metzenbaum scissor was made to initiate the dilation.  He was dilated distally to 10 mm with the intracorporeal dilators and was dilated proximally to 12 mm without any difficulty.  The left corpora was then measured.  He was noted to have a 9.5 cm proximal length and a 11.5 cm distal length for a total length of 22 cm which is confirmed by penile stretch measurement as well.  I then opened the right corpora in identical  fashion, made the Metzenbaum pass and then dilated to 10 mm distally and 12 mm proximally. Measurements were similar on the right side.  At this point, the corpora were irrigated with antibiotic solution.  The penile prosthesis was then repaired.  A Titan OTR 0 degree angle cylinder set with pump using a 20 cm cylinder and 2 cm rear-tip extenders were then placed. The catalog number is [id number] and the batch code is [id number].  The cylinders were then prepared in usual fashion by saturation with double antibiotic solution followed by appropriate filling and purging of air.  Once the cylinders were properly prepared, the 2 cm rear-tip extenders were placed and secured.  The prosthetic was then brought onto the field.  The right cylinder was then placed using Furlow inserter and Mellody Dance needle.  The proximal end was seated. The distal end was then seated with no buckling at the corporotomy.  The left cylinder was then placed using the Furlow inserter and Mellody Dance needle and actually the tethering suture had been passed before placement of the right prosthetic cylinder to avoid injury to the cylinder.  The proximal left cylinder was then placed followed by the distal end once again.  It seated well without buckling.  At this point, the device was inflated using a syringe as a reservoir and a total of 75 mL was placed within the cylinder with an excellent erection with good rigidity and minimal curvature.  At this point, the device was deflated and the corporotomies were closed using the pre-placed stay sutures.  The pump was then positioned in the dependent portion of the right hemiscrotum and secured externally with a Babcock clamp.  The additional irrigation with antibiotic solution was performed.  The pump tubing was then trimmed to length followed by the reservoir tubing and the Quick Connect connector was used to connect the device in the usual fashion with flushing of the open tube  prior to all connections.  The prosthesis had been deflated to 15 mL prior to connection and the reservoir has been filled to 70 mL with saline prior to connection for a total of 85 mL in the system.  Once the device was completely connected, the pump was used to inflate the device once again with an excellent erection noted.  The device was left partially inflated.  The tethering sutures were then removed. Inspection revealed no significant bleeding in the wound.  The wound was then closed using interrupted 3-0 chromic in the subcutaneous tissue followed by a 4-0 intracuticular Monocryl stitch followed by Dermabond. The tethering suture, punctures were closed with Dermabond as well.  The Foley catheter was placed to straight drainage.  Mesh panties  and fluff Kerlix were then applied to the patient.  His anesthetic was reversed and he was moved to recovery room in stable condition.  There were no complications.  The device serial numbers included the [employer], catalog [id number], batch code [id number], the Titan seal reservoir 125 mL, catalog [phone], batch code [id number], and Titan OTR 0 degree angle cylinder set with prompt 20 cm, catalog [id number], batch code [id number].     Excell Seltzer. Annabell Howells, M.D.     JJW/MEDQ  D:  08/10/2012  T:  08/11/2012  Job:  [id number]

## 2012-08-11 NOTE — Discharge Summary (Signed)
Physician Discharge Summary  Patient ID: [name] Short MRN: [id number] DOB/AGE: 1946/02/22 66 y.o.  Admit date: 08/10/2012 Discharge date: 08/11/2012  Admission Diagnoses: Organic erectile dysfunction  Discharge Diagnoses: Same Active Problems: Organic Erectile dysfunction  Discharged Condition: good  Hospital Course: [name] Short had placement of a Coloplast Titan multicomponent inflatable penile prosthesis with a reservoir and 20cm cylinders with 2cm RTE's without complications.  His post op course was unremarkable and this morning had an intact incision with mild right scrotal ecchymosis.  The prosthesis was deflated and the foley was removed.  He was sent home after voiding.   Consults: None  Significant Diagnostic Studies: None  Treatments: surgery: as above  Discharge Exam: Blood pressure 131/72, pulse 80, temperature 98.5 F (36.9 C), temperature source Oral, resp. rate 18, height 5\' 7"  (1.702 m), weight 78.926 kg (174 lb), SpO2 95.00%. General appearance: alert and no distress Resp: clear to auscultation bilaterally Cardio: regular rate and rhythm GI: Soft, non-tender without ecchymosis.  Male genitalia: normal, Erect penis with a foley at the meatus and only mild right scrotal ecchymosis.  The pump is in good position.  The infrapubic incision is intact without ecchymosis or erythema.   Disposition: 01-Home or Self Care  Discharge Orders    Future Appointments: Provider: Department: Dept Phone: Center:   10/28/2012 1:30 PM Windell Hummingbird [facility] MEDICAL ONCOLOGY [phone] None   10/28/2012 2:30 PM [facility] [phone] [place]   11/02/2012 1:45 PM Si Gaul, MD [facility] MEDICAL ONCOLOGY [phone] None   06/02/2013 10:00 AM [name], MD [facility] [phone] [facility]       Medication List     As of 08/11/2012  8:13 AM    STOP taking these medications         ciprofloxacin 500 MG tablet   Commonly known as: CIPRO      TAKE these medications         acetaminophen 650 MG CR tablet   Commonly known as: TYLENOL   Take 650 mg by mouth every 8 (eight) hours as needed.      brimonidine 0.15 % ophthalmic solution   Commonly known as: ALPHAGAN   Place 1 drop into the right eye 3 (three) times daily.      lansoprazole 30 MG capsule   Commonly known as: PREVACID   Take 30 mg by mouth daily.      levofloxacin 500 MG tablet   Commonly known as: LEVAQUIN   Take 1 tablet (500 mg total) by mouth daily.      losartan 100 MG tablet   Commonly known as: COZAAR   Take 100 mg by mouth daily.      multivitamin tablet   Take 1 tablet by mouth daily.      NIFEdipine 90 MG 24 hr tablet   Commonly known as: ADALAT CC   Take 90 mg by mouth daily.      oxyCODONE-acetaminophen 5-325 MG per tablet   Commonly known as: PERCOCET/ROXICET   Take 1 tablet by mouth every 4 (four) hours as needed for pain.           Follow-up Information    Follow up with Anner Crete, MD. On 08/18/2012. [phone])    Contact information:   [address] [place] [address] [phone]          Signed: Anner Crete 08/11/2012, 8:13  AM

## 2012-08-11 NOTE — Care Management Note (Signed)
    Page 1 of 1   08/11/2012     9:06:29 AM   CARE MANAGEMENT NOTE 08/11/2012  Patient:  [name] Short, [name] Short   Account Number:  [id number]  Date Initiated:  08/11/2012  Documentation initiated by:  Lorenda Ishihara  Subjective/Objective Assessment:   66 yo male admitted s/p penile implant. PTA lived at home with spouse.     Action/Plan:   Home after overnight obs   Anticipated DC Date:  08/11/2012   Anticipated DC Plan:  HOME/SELF CARE      DC Planning Services  CM consult      Choice offered to / List presented to:             Status of service:  Completed, signed off Medicare Important Message given?   (If response is "NO", the following Medicare IM given date fields will be blank) Date Medicare IM given:   Date Additional Medicare IM given:    Discharge Disposition:  HOME/SELF CARE  Per UR Regulation:  Reviewed for med. necessity/level of care/duration of stay  If discussed at Long Length of Stay Meetings, dates discussed:    Comments:

## 2012-08-30 ENCOUNTER — Encounter (INDEPENDENT_AMBULATORY_CARE_PROVIDER_SITE_OTHER): Payer: Self-pay | Admitting: *Deleted

## 2012-09-02 ENCOUNTER — Telehealth: Payer: Self-pay | Admitting: *Deleted

## 2012-09-02 NOTE — Telephone Encounter (Signed)
[facility] note by Dr Maud Deed given to Dr Donnald Garre to review.

## 2012-10-25 ENCOUNTER — Other Ambulatory Visit (HOSPITAL_COMMUNITY): Payer: BC Managed Care – PPO

## 2012-10-28 ENCOUNTER — Ambulatory Visit (HOSPITAL_COMMUNITY)
Admission: RE | Admit: 2012-10-28 | Discharge: 2012-10-28 | Disposition: A | Discharge status: Home / Self Care | Payer: BC Managed Care – PPO | Source: Ambulatory Visit | Attending: Internal Medicine | Admitting: Internal Medicine

## 2012-10-28 ENCOUNTER — Other Ambulatory Visit (HOSPITAL_BASED_OUTPATIENT_CLINIC_OR_DEPARTMENT_OTHER): Payer: BC Managed Care – PPO

## 2012-10-28 ENCOUNTER — Encounter (HOSPITAL_COMMUNITY): Payer: Self-pay

## 2012-10-28 DIAGNOSIS — C3490 Malignant neoplasm of unspecified part of unspecified bronchus or lung: Secondary | ICD-10-CM

## 2012-10-28 DIAGNOSIS — C3410 Malignant neoplasm of upper lobe, unspecified bronchus or lung: Secondary | ICD-10-CM

## 2012-10-28 DIAGNOSIS — R911 Solitary pulmonary nodule: Secondary | ICD-10-CM | POA: Insufficient documentation

## 2012-10-28 DIAGNOSIS — C61 Malignant neoplasm of prostate: Secondary | ICD-10-CM | POA: Insufficient documentation

## 2012-10-28 DIAGNOSIS — J984 Other disorders of lung: Secondary | ICD-10-CM | POA: Insufficient documentation

## 2012-10-28 LAB — CBC WITH DIFFERENTIAL/PLATELET
BASO%: 1.3 % (ref 0.0–2.0)
Eosinophils Absolute: 0.2 10*3/uL (ref 0.0–0.5)
LYMPH%: 30.6 % (ref 14.0–49.0)
MCHC: 34.2 g/dL (ref 32.0–36.0)
MONO#: 0.7 10*3/uL (ref 0.1–0.9)
NEUT#: 3.9 10*3/uL (ref 1.5–6.5)
Platelets: 343 10*3/uL (ref 140–400)
RBC: 4.15 10*6/uL — ABNORMAL LOW (ref 4.20–5.82)
WBC: 7 10*3/uL (ref 4.0–10.3)
lymph#: 2.2 10*3/uL (ref 0.9–3.3)

## 2012-10-28 LAB — COMPREHENSIVE METABOLIC PANEL (CC13)
CO2: 22 mEq/L (ref 22–29)
Calcium: 9.1 mg/dL (ref 8.4–10.4)
Chloride: 110 mEq/L — ABNORMAL HIGH (ref 98–107)
Creatinine: 1.2 mg/dL (ref 0.7–1.3)
Glucose: 106 mg/dl — ABNORMAL HIGH (ref 70–99)
Sodium: 141 mEq/L (ref 136–145)
Total Bilirubin: 0.24 mg/dL (ref 0.20–1.20)
Total Protein: 7.6 g/dL (ref 6.4–8.3)

## 2012-10-28 MED ORDER — IOHEXOL 300 MG/ML  SOLN
80.0000 mL | Freq: Once | INTRAMUSCULAR | Status: AC | PRN
  Administered 2012-10-28: 80 mL via INTRAVENOUS

## 2012-11-02 ENCOUNTER — Telehealth: Payer: Self-pay | Admitting: Internal Medicine

## 2012-11-02 ENCOUNTER — Ambulatory Visit (HOSPITAL_BASED_OUTPATIENT_CLINIC_OR_DEPARTMENT_OTHER): Payer: BC Managed Care – PPO | Admitting: Internal Medicine

## 2012-11-02 ENCOUNTER — Encounter: Payer: Self-pay | Admitting: Internal Medicine

## 2012-11-02 VITALS — BP 138/77 | HR 88 | Temp 97.0°F | Resp 20 | Ht 67.0 in | Wt 181.4 lb

## 2012-11-02 DIAGNOSIS — C3410 Malignant neoplasm of upper lobe, unspecified bronchus or lung: Secondary | ICD-10-CM

## 2012-11-02 DIAGNOSIS — C3490 Malignant neoplasm of unspecified part of unspecified bronchus or lung: Secondary | ICD-10-CM

## 2012-11-02 NOTE — Progress Notes (Signed)
[facility] Telephone:([phone]) [phone]   Fax:([phone]  OFFICE PROGRESS NOTE  Carylon Perches, MD [address] [place] [address]  PRINCIPAL DIAGNOSIS: Stage 1A (T1b N0 M0) non-small cell lung cancer, squamous cell carcinoma diagnosed in June, 2011.   PRIOR THERAPY: Status post left upper lobectomy with lymph node dissection under the care of Dr. Tyrone Sage on March 20, 2010.   CURRENT THERAPY: Observation.  INTERVAL HISTORY: NAJEE Short 67 y.o. male returns to the clinic today for routine six-month followup visit accompanied his wife. The patient is feeling fine today with no specific complaints. He denied having any significant chest pain, shortness breath, cough or hemoptysis. He has no weight loss or night sweats. He has no nausea or vomiting. The patient had repeat CT scan of the chest performed recently and he is here for evaluation and discussion of his scan results.  MEDICAL HISTORY: Past Medical History  Diagnosis Date  . Hypertension   . Shortness of breath     ONLY WITH EXERTION; HX OF LOBECTOMY FOR CANER  . COPD (chronic obstructive pulmonary disease)     FORMER SMOKER  . Pneumonia 2011  . GERD (gastroesophageal reflux disease)   . Arthritis     ESP IN HANDS  . Prostate cancer     prostate ca dx 11/11  . Lung cancer     lung ca dx 02/2010  . Cancer     OCCULAR RIGHT EYE--SURGERY AND CHEMO  PT IS LEGALLY BLIND IN RT EYE  . Erectile dysfunction   . Glaucoma     ONLY IN RIGHT EYE  . Cataract     LEFT EYE    ALLERGIES:  has No Known Allergies.  MEDICATIONS:  Current Outpatient Prescriptions  Medication Sig Dispense Refill  . acetaminophen (TYLENOL) 650 MG CR tablet Take 650 mg by mouth every 8 (eight) hours as needed.      . brimonidine (ALPHAGAN) 0.15 % ophthalmic solution Place 1 drop into the right eye 3 (three) times daily.      . lansoprazole (PREVACID) 30 MG capsule Take 30 mg by mouth daily.      [name] levofloxacin  (LEVAQUIN) 500 MG tablet Take 1 tablet (500 mg total) by mouth daily.  5 tablet  0  . losartan (COZAAR) 100 MG tablet Take 100 mg by mouth daily.      . Multiple Vitamin (MULTIVITAMIN) tablet Take 1 tablet by mouth daily.      [name] NIFEdipine (ADALAT CC) 90 MG 24 hr tablet Take 90 mg by mouth daily.      [name] oxyCODONE-acetaminophen (ROXICET) 5-325 MG per tablet Take 1 tablet by mouth every 4 (four) hours as needed for pain.  30 tablet  0   No current facility-administered medications for this visit.    SURGICAL HISTORY:  Past Surgical History  Procedure Laterality Date  . Eye surgery      OCT 1999 DETACHED RETINA -RIGHT; MAY AND JUNE 2009 RIGHT EYE SURGERY FOR CANCER-LENS HAS BEEN REMOVED.  [name] Left upper lobectomy  03/2010    FOR CANCER  . Prostatectomy  DEC 2011    FOR CANCER  . Appendectomy  JUNE 2000  . Colonoscopy  08/06/2012    Procedure: COLONOSCOPY;  Surgeon: Malissa Hippo, MD;  [facility];  Service: Endoscopy;  Laterality: N/A;  955  . Penile prosthesis implant  08/10/2012    Procedure: PENILE PROTHESIS INFLATABLE;  Surgeon: Excell Seltzer  Annabell Howells, MD;  Location: [facility];  Service: Urology;  Laterality: N/A;  IMPLANT 3 PIECE PENILE PROSTHESIS     REVIEW OF SYSTEMS:  A comprehensive review of systems was negative.   PHYSICAL EXAMINATION: General appearance: alert, cooperative and no distress Head: Normocephalic, without obvious abnormality, atraumatic Neck: no adenopathy Resp: clear to auscultation bilaterally Cardio: regular rate and rhythm, S1, S2 normal, no murmur, click, rub or gallop GI: soft, non-tender; bowel sounds normal; no masses,  no organomegaly Extremities: extremities normal, atraumatic, no cyanosis or edema  ECOG PERFORMANCE STATUS: 0 - Asymptomatic  There were no vitals taken for this visit.  LABORATORY DATA: Lab Results  Component Value Date   WBC 7.0 10/28/2012   HGB 12.9* 10/28/2012   HCT 37.7* 10/28/2012   MCV 90.7 10/28/2012   PLT 343 10/28/2012       Chemistry      Component Value Date/Time   NA 141 10/28/2012 1251   NA 137 08/05/2012 1005   NA 142 04/19/2012 0835   K 3.9 10/28/2012 1251   K 3.9 08/05/2012 1005   K 4.2 04/19/2012 0835   CL 110* 10/28/2012 1251   CL 101 08/05/2012 1005   CL 106 04/19/2012 0835   CO2 22 10/28/2012 1251   CO2 23 08/05/2012 1005   CO2 24 04/19/2012 0835   BUN 31.4* 10/28/2012 1251   BUN 21 08/05/2012 1005   BUN 30* 04/19/2012 0835   CREATININE 1.2 10/28/2012 1251   CREATININE 0.93 08/05/2012 1005   CREATININE 1.4* 04/19/2012 0835      Component Value Date/Time   CALCIUM 9.1 10/28/2012 1251   CALCIUM 9.5 08/05/2012 1005   CALCIUM 9.1 04/19/2012 0835   ALKPHOS 94 10/28/2012 1251   ALKPHOS 80 04/19/2012 0835   ALKPHOS 87 07/19/2010 0813   AST 16 10/28/2012 1251   AST 26 04/19/2012 0835   AST 23 07/19/2010 0813   ALT 19 10/28/2012 1251   ALT 23 07/19/2010 0813   BILITOT 0.24 10/28/2012 1251   BILITOT 0.60 04/19/2012 0835   BILITOT 0.3 07/19/2010 0813       RADIOGRAPHIC STUDIES: Ct Chest W Contrast  10/28/2012  *RADIOLOGY REPORT*  Clinical Data: Follow-up lung carcinoma.  Previous left lung resection.  Prostate carcinoma.  CT CHEST WITH CONTRAST  Technique:  Multidetector CT imaging of the chest was performed following the standard protocol during bolus administration of intravenous contrast.  Contrast: 80mL OMNIPAQUE IOHEXOL 300 MG/ML  SOLN  Comparison: 04/19/2012  Findings: Postop changes from previous left upper lobectomy again demonstrated.  A subpleural nodular density in the lateral aspect the left lower lobe on image 36 measures 8 x 17 mm compared to 9 x 18 mm on previous study.  Associated scarring is also stable.  A 5 mm noncalcified nodule in the right midlung on image 24 is also stable.  No new or enlarging solid pulmonary nodules are seen.  There is a focal ground-glass opacity in the right middle lobe measuring 12 mm on image 19.  This is nonspecific and could be inflammatory in etiology or due to  adenocarcinoma in situ.  There is no evidence of pulmonary infiltrate or pleural or pericardial effusion.  No evidence of hilar or mediastinal lymphadenopathy.  No adenopathy seen elsewhere within the thorax. No evidence of chest wall mass or suspicious bone lesions.  Both adrenal glands are normal in appearance.  IMPRESSION:  1.  Stable small subpleural nodular density in the lateral left lower lobe. Stable 5 mm nodular  density in the right midlung. 2.  Stable postop changes from left upper lobectomy. No evidence of metastatic disease. 3.  New 12 mm ground-glass nodule in the right middle lobe.  This is nonspecific and could be inflammatory or due to adenocarcinoma in situ.  Initial follow-up by chest CT without contrast is recommended in 3 months to confirm persistence.   This recommendation follows the consensus statement: Recommendations for the Management of Subsolid Pulmonary Nodules Detected at CT:  A Statement from the Fleischner Society as published in Radiology 2013; 266:304-317.   Original Report Authenticated By: Myles Rosenthal, M.D.     ASSESSMENT: This is a very pleasant 67 years old white male with history of stage IA non-small cell lung cancer status post left upper lobectomy with lymph node dissection and has been observation since July 2007 was no evidence for disease recurrence. The patient has a suspicious new groundglass nodule in the right middle lobe that is not a specific and could be related to inflammatory process or adenocarcinoma in situ.  PLAN: I discussed the scan results with the patient and his wife today. I recommended for him to continue on observation but we will repeat CT scan of the chest in 3 months for evaluation of the right middle lobe nodule. He was advised to call immediately if he has any concerning symptoms in the interval. All questions were answered. The patient knows to call the clinic with any problems, questions or concerns. We can certainly see the patient much  sooner if necessary.

## 2012-11-02 NOTE — Patient Instructions (Signed)
There is a suspicious nodule in the right middle lobe on the recent scan of the chest. Followup visit in 3 months with repeat CT scan.

## 2012-12-23 DIAGNOSIS — R7309 Other abnormal glucose: Secondary | ICD-10-CM | POA: Insufficient documentation

## 2012-12-23 DIAGNOSIS — E785 Hyperlipidemia, unspecified: Secondary | ICD-10-CM | POA: Insufficient documentation

## 2012-12-23 DIAGNOSIS — K2100 Gastro-esophageal reflux disease with esophagitis, without bleeding: Secondary | ICD-10-CM | POA: Insufficient documentation

## 2012-12-23 DIAGNOSIS — C61 Malignant neoplasm of prostate: Secondary | ICD-10-CM | POA: Insufficient documentation

## 2012-12-23 DIAGNOSIS — H409 Unspecified glaucoma: Secondary | ICD-10-CM | POA: Insufficient documentation

## 2012-12-23 DIAGNOSIS — C3490 Malignant neoplasm of unspecified part of unspecified bronchus or lung: Secondary | ICD-10-CM | POA: Insufficient documentation

## 2012-12-23 DIAGNOSIS — C6990 Malignant neoplasm of unspecified site of unspecified eye: Secondary | ICD-10-CM | POA: Insufficient documentation

## 2012-12-23 DIAGNOSIS — I10 Essential (primary) hypertension: Secondary | ICD-10-CM | POA: Insufficient documentation

## 2013-02-01 ENCOUNTER — Other Ambulatory Visit (HOSPITAL_BASED_OUTPATIENT_CLINIC_OR_DEPARTMENT_OTHER): Payer: BC Managed Care – PPO

## 2013-02-01 ENCOUNTER — Encounter (HOSPITAL_COMMUNITY): Payer: Self-pay

## 2013-02-01 ENCOUNTER — Ambulatory Visit (HOSPITAL_COMMUNITY)
Admission: RE | Admit: 2013-02-01 | Discharge: 2013-02-01 | Disposition: A | Discharge status: Home / Self Care | Payer: BC Managed Care – PPO | Source: Ambulatory Visit | Attending: Internal Medicine | Admitting: Internal Medicine

## 2013-02-01 DIAGNOSIS — Z9221 Personal history of antineoplastic chemotherapy: Secondary | ICD-10-CM | POA: Insufficient documentation

## 2013-02-01 DIAGNOSIS — I700 Atherosclerosis of aorta: Secondary | ICD-10-CM | POA: Insufficient documentation

## 2013-02-01 DIAGNOSIS — C3410 Malignant neoplasm of upper lobe, unspecified bronchus or lung: Secondary | ICD-10-CM

## 2013-02-01 DIAGNOSIS — I2510 Atherosclerotic heart disease of native coronary artery without angina pectoris: Secondary | ICD-10-CM | POA: Insufficient documentation

## 2013-02-01 DIAGNOSIS — Z85840 Personal history of malignant neoplasm of eye: Secondary | ICD-10-CM | POA: Insufficient documentation

## 2013-02-01 DIAGNOSIS — C61 Malignant neoplasm of prostate: Secondary | ICD-10-CM | POA: Insufficient documentation

## 2013-02-01 DIAGNOSIS — C3490 Malignant neoplasm of unspecified part of unspecified bronchus or lung: Secondary | ICD-10-CM | POA: Insufficient documentation

## 2013-02-01 DIAGNOSIS — Z902 Acquired absence of lung [part of]: Secondary | ICD-10-CM | POA: Insufficient documentation

## 2013-02-01 LAB — COMPREHENSIVE METABOLIC PANEL (CC13)
ALT: 18 U/L (ref 0–55)
BUN: 22.9 mg/dL (ref 7.0–26.0)
CO2: 22 mEq/L (ref 22–29)
Calcium: 9.2 mg/dL (ref 8.4–10.4)
Chloride: 108 mEq/L — ABNORMAL HIGH (ref 98–107)
Creatinine: 1.2 mg/dL (ref 0.7–1.3)
Glucose: 106 mg/dl — ABNORMAL HIGH (ref 70–99)
Total Bilirubin: 0.35 mg/dL (ref 0.20–1.20)

## 2013-02-01 LAB — CBC WITH DIFFERENTIAL/PLATELET
BASO%: 1 % (ref 0.0–2.0)
Basophils Absolute: 0.1 10*3/uL (ref 0.0–0.1)
Eosinophils Absolute: 0.2 10*3/uL (ref 0.0–0.5)
HCT: 38.7 % (ref 38.4–49.9)
HGB: 13.2 g/dL (ref 13.0–17.1)
LYMPH%: 29.8 % (ref 14.0–49.0)
MCHC: 34 g/dL (ref 32.0–36.0)
MONO#: 0.7 10*3/uL (ref 0.1–0.9)
NEUT#: 4.6 10*3/uL (ref 1.5–6.5)
NEUT%: 57.6 % (ref 39.0–75.0)
Platelets: 304 10*3/uL (ref 140–400)
WBC: 8 10*3/uL (ref 4.0–10.3)
lymph#: 2.4 10*3/uL (ref 0.9–3.3)

## 2013-02-01 MED ORDER — IOHEXOL 300 MG/ML  SOLN
80.0000 mL | Freq: Once | INTRAMUSCULAR | Status: AC | PRN
  Administered 2013-02-01: 80 mL via INTRAVENOUS

## 2013-02-03 ENCOUNTER — Telehealth: Payer: Self-pay | Admitting: Internal Medicine

## 2013-02-03 ENCOUNTER — Ambulatory Visit (HOSPITAL_BASED_OUTPATIENT_CLINIC_OR_DEPARTMENT_OTHER): Payer: BC Managed Care – PPO | Admitting: Internal Medicine

## 2013-02-03 ENCOUNTER — Encounter: Payer: Self-pay | Admitting: Internal Medicine

## 2013-02-03 VITALS — BP 144/87 | HR 81 | Temp 97.0°F | Resp 18 | Ht 67.0 in | Wt 179.6 lb

## 2013-02-03 DIAGNOSIS — C3410 Malignant neoplasm of upper lobe, unspecified bronchus or lung: Secondary | ICD-10-CM

## 2013-02-03 DIAGNOSIS — C3490 Malignant neoplasm of unspecified part of unspecified bronchus or lung: Secondary | ICD-10-CM

## 2013-02-03 NOTE — Progress Notes (Signed)
[facility] Telephone:(336) [phone]   Fax:(336) [phone]  OFFICE PROGRESS NOTE  Carylon Perches, MD [address] [place] [address]  PRINCIPAL DIAGNOSIS: Stage 1A (T1b N0 M0) non-small cell lung cancer, squamous cell carcinoma diagnosed in June, 2011.   PRIOR THERAPY: Status post left upper lobectomy with lymph node dissection under the care of Dr. Tyrone Sage on March 20, 2010.   CURRENT THERAPY: Observation.  INTERVAL HISTORY: [name] Short 67 y.o. male returns to the clinic today for ollow up visit. The patient is feeling fine today with no specific complaints.he denied having any significant chest pain, shortness breath, cough or hemoptysis. He has no weight loss or night sweats. The patient had repeat CT scan of the chest performed recently and he is here for evaluation and discussion of his scan results.  MEDICAL HISTORY: Past Medical History  Diagnosis Date  . Hypertension   . Shortness of breath     ONLY WITH EXERTION; HX OF LOBECTOMY FOR CANER  . COPD (chronic obstructive pulmonary disease)     FORMER SMOKER  . Pneumonia 2011  . GERD (gastroesophageal reflux disease)   . Arthritis     ESP IN HANDS  . Prostate cancer     prostate ca dx 11/11  . Lung cancer     lung ca dx 02/2010  . Cancer     OCCULAR RIGHT EYE--SURGERY AND CHEMO  PT IS LEGALLY BLIND IN RT EYE  . Erectile dysfunction   . Glaucoma     ONLY IN RIGHT EYE  . Cataract     LEFT EYE    ALLERGIES:  has No Known Allergies.  MEDICATIONS:  Current Outpatient Prescriptions  Medication Sig Dispense Refill  . acetaminophen (TYLENOL) 650 MG CR tablet Take 650 mg by mouth every 8 (eight) hours as needed.      . brimonidine (ALPHAGAN) 0.15 % ophthalmic solution Place 1 drop into the right eye 3 (three) times daily.      . lansoprazole (PREVACID) 30 MG capsule Take 30 mg by mouth daily.      [name] losartan (COZAAR) 100 MG tablet Take 100 mg by mouth daily.      [name] NIFEdipine (ADALAT  CC) 90 MG 24 hr tablet Take 90 mg by mouth daily.      [name] UNABLE TO FIND Muro 128 eye drops OTC, use in right eye BID       No current facility-administered medications for this visit.    SURGICAL HISTORY:  Past Surgical History  Procedure Laterality Date  . Eye surgery      OCT 1999 DETACHED RETINA -RIGHT; MAY AND JUNE 2009 RIGHT EYE SURGERY FOR CANCER-LENS HAS BEEN REMOVED.  [name] Left upper lobectomy  03/2010    FOR CANCER  . Prostatectomy  DEC 2011    FOR CANCER  . Appendectomy  JUNE 2000  . Colonoscopy  08/06/2012    Procedure: COLONOSCOPY;  Surgeon: Malissa Hippo, MD;  [facility];  Service: Endoscopy;  Laterality: N/A;  955  . Penile prosthesis implant  08/10/2012    Procedure: PENILE PROTHESIS INFLATABLE;  Surgeon: Anner Crete, MD;  Location: [facility];  Service: Urology;  Laterality: N/A;  IMPLANT 3 PIECE PENILE PROSTHESIS     REVIEW OF SYSTEMS:  A comprehensive review of systems was negative.   PHYSICAL EXAMINATION: General appearance: alert, cooperative and no distress Head: Normocephalic, without obvious abnormality, atraumatic Neck: no adenopathy Lymph nodes:  Cervical, supraclavicular, and axillary nodes normal. Resp: clear to auscultation bilaterally Cardio: regular rate and rhythm, S1, S2 normal, no murmur, click, rub or gallop GI: soft, non-tender; bowel sounds normal; no masses,  no organomegaly Extremities: extremities normal, atraumatic, no cyanosis or edema  ECOG PERFORMANCE STATUS: 0 - Asymptomatic  Blood pressure 144/87, pulse 81, temperature 97 F (36.1 C), temperature source Oral, resp. rate 18, height 5\' 7"  (1.702 m), weight 179 lb 9.6 oz (81.466 kg).  LABORATORY DATA: Lab Results  Component Value Date   WBC 8.0 02/01/2013   HGB 13.2 02/01/2013   HCT 38.7 02/01/2013   MCV 90.7 02/01/2013   PLT 304 02/01/2013      Chemistry      Component Value Date/Time   NA 140 02/01/2013 0758   NA 137 08/05/2012 1005   NA 142 04/19/2012 0835   K 4.4  02/01/2013 0758   K 3.9 08/05/2012 1005   K 4.2 04/19/2012 0835   CL 108* 02/01/2013 0758   CL 101 08/05/2012 1005   CL 106 04/19/2012 0835   CO2 22 02/01/2013 0758   CO2 23 08/05/2012 1005   CO2 24 04/19/2012 0835   BUN 22.9 02/01/2013 0758   BUN 21 08/05/2012 1005   BUN 30* 04/19/2012 0835   CREATININE 1.2 02/01/2013 0758   CREATININE 0.93 08/05/2012 1005   CREATININE 1.4* 04/19/2012 0835      Component Value Date/Time   CALCIUM 9.2 02/01/2013 0758   CALCIUM 9.5 08/05/2012 1005   CALCIUM 9.1 04/19/2012 0835   ALKPHOS 85 02/01/2013 0758   ALKPHOS 80 04/19/2012 0835   ALKPHOS 87 07/19/2010 0813   AST 16 02/01/2013 0758   AST 26 04/19/2012 0835   AST 23 07/19/2010 0813   ALT 18 02/01/2013 0758   ALT 23 07/19/2010 0813   BILITOT 0.35 02/01/2013 0758   BILITOT 0.60 04/19/2012 0835   BILITOT 0.3 07/19/2010 0813       RADIOGRAPHIC STUDIES: Ct Chest W Contrast  02/01/2013   *RADIOLOGY REPORT*  Clinical Data: Lung cancer diagnosed 02/2010 status post left lung resection.  Right eye cancer diagnosed 2009, status post chemotherapy drops.  Prostate cancer diagnosed 07/2010.  CT CHEST WITH CONTRAST  Technique:  Multidetector CT imaging of the chest was performed following the standard protocol during bolus administration of intravenous contrast.  Contrast: 80mL OMNIPAQUE IOHEXOL 300 MG/ML  SOLN  Comparison: 10/28/2012 and 04/19/2012.  Findings: Status post left upper lobectomy.  Subpleural nodularity in the lateral left lower lobe, measuring 2.1 x 0.9 cm (series 5/image 39), unchanged from 2013.   Small nodular thickening along the right major fissure (series 5/image 27), unchanged.  Minimal patchy opacity in the medial right upper lobe (series 5/images 12-13), new, possibly infectious/inflammatory.  Underlying mild centrilobular emphysematous changes. No pleural effusion or pneumothorax.  Visualized thyroid is unremarkable.  The heart is normal in size.  No pericardial effusion.  Coronary atherosclerosis.  Atherosclerotic  calcifications of the aortic arch.  Small mediastinal lymph nodes measuring up to 9 mm short axis (series 2/image 22), unchanged.  No suspicious hilar or axillary lymphadenopathy.  Visualized upper abdomen is unremarkable.  Mild degenerative changes of the visualized thoracolumbar spine.  IMPRESSION: Status post left upper lobectomy.  No evidence of recurrent or metastatic disease in the chest.  Stable subpleural nodularity in the lateral left lower lobe.  Minimal patchy opacity in the medial right upper lobe, new, possibly infectious/inflammatory.   Original Report Authenticated By: Charline Bills, M.D.  ASSESSMENT: this is a very pleasant 67 years old white male with history of stage IA non-small cell lung cancer status post left upper lobectomy with lymph node dissection and has been observation since July 2011 with no evidence for disease progression.   PLAN: I discussed the scan results with the patient and his wife. I recommended for him to continue on observation with repeat CT scan of the chest in one year. The patient was advised to call immediately if he has any  Concerning symptoms in the interval. The previous suspicious lesion in the right middle lobe was most likely inflammatory.   All questions were answered. The patient knows to call the clinic with any problems, questions or concerns. We can certainly see the patient much sooner if necessary.

## 2013-02-05 NOTE — Patient Instructions (Signed)
Your scan showed no evidence for disease progression. Follow up visit in one year with repeat CT scan of the chest

## 2013-06-01 ENCOUNTER — Other Ambulatory Visit: Payer: Self-pay | Admitting: *Deleted

## 2013-06-02 ENCOUNTER — Ambulatory Visit: Payer: Medicare Other | Admitting: Cardiothoracic Surgery

## 2013-07-14 ENCOUNTER — Ambulatory Visit (INDEPENDENT_AMBULATORY_CARE_PROVIDER_SITE_OTHER): Payer: BC Managed Care – PPO | Admitting: Cardiothoracic Surgery

## 2013-07-14 ENCOUNTER — Encounter: Payer: Self-pay | Admitting: Cardiothoracic Surgery

## 2013-07-14 DIAGNOSIS — C3410 Malignant neoplasm of upper lobe, unspecified bronchus or lung: Secondary | ICD-10-CM

## 2013-07-14 DIAGNOSIS — Z09 Encounter for follow-up examination after completed treatment for conditions other than malignant neoplasm: Secondary | ICD-10-CM

## 2013-07-14 NOTE — Progress Notes (Signed)
[address]       [place] [address]             [phone]                            [name] Short Medical Record #[id number] Date of Birth: 03/23/1946  [name] Perches, MD Dr Gwenyth Bouillon   Chief Complaint:   PostOp Follow Up Visit History of Present Illness:      The patient returns to the office today in followup after a  left video-assisted thoracoscopy and left upper lobectomy for a squamous  cell carcinoma 03/20/2010.He had T2N0M0 carcinoma of  the left upper lobe.  He notes that he has been doing very well. Working full time.  Zubrod Score: [x]  Normal activity, no symptoms []  Symptoms, fully ambulatory []  Symptoms, in bed less than or equal to 50% of the time []  Symptoms, in bed greater than 50% of the time but less than 100% []  Bedridden []  Moribund    History  Smoking status  . Former Smoker -- 1.50 packs/day for 40 years  . Quit date: 09/07/2008  Smokeless tobacco  . Former Neurosurgeon  . Types: Chew  . Quit date: 10/08/1985       No Known Allergies  Current Outpatient Prescriptions  Medication Sig Dispense Refill  . acetaminophen (TYLENOL) 650 MG CR tablet Take 650 mg by mouth every 8 (eight) hours as needed.      . brimonidine (ALPHAGAN) 0.15 % ophthalmic solution Place 1 drop into the right eye 3 (three) times daily.      . lansoprazole (PREVACID) 30 MG capsule Take 30 mg by mouth daily.      [name] losartan (COZAAR) 100 MG tablet Take 100 mg by mouth daily.      [name] NIFEdipine (ADALAT CC) 90 MG 24 hr tablet Take 90 mg by mouth daily.      [name] UNABLE TO FIND Muro 128 eye drops OTC, use in right eye BID       No current facility-administered medications for this visit.       Physical Exam: BP 155/87  Pulse 68  Resp 16  Ht 5\' 7"  (1.702 m)  Wt 180 lb (81.647 kg)  BMI 28.19 kg/m2  SpO2 98%  General appearance: alert and cooperative Neurologic: intact Heart: regular rate and rhythm, S1, S2 normal, no murmur, click, rub  or gallop Lungs: clear to auscultation bilaterally Abdomen: soft, non-tender; bowel sounds normal; no masses,  no organomegaly, he has ventral diastasis , no palpable AAA Extremities: extremities normal, atraumatic, no cyanosis or edema and Homans sign is negative, no sign of DVT Wound: Patient's chest incision is well-healed there is no cervical orsupraclavicular adenopathy   Diagnostic Studies & Laboratory data:         Recent Radiology Findings:  02/01/2013:RADIOLOGY REPORT*  Clinical Data: Lung cancer diagnosed 02/2010 status post left lung  resection. Right eye cancer diagnosed 2009, status post  chemotherapy drops. Prostate cancer diagnosed 07/2010.  CT CHEST WITH CONTRAST  Technique: Multidetector CT imaging of the chest was performed  following the standard protocol during bolus administration of  intravenous contrast.  Contrast: 80mL OMNIPAQUE IOHEXOL 300 MG/ML SOLN  Comparison: 10/28/2012 and 04/19/2012.  Findings: Status post left upper lobectomy.  Subpleural nodularity in the lateral left lower lobe, measuring 2.1  x 0.9 cm (series 5/image 39), unchanged from 2013. Small nodular  thickening along the right major fissure (series 5/image 27),  unchanged.  Minimal patchy opacity in the medial right upper lobe (series  5/images 12-13), new, possibly infectious/inflammatory.  Underlying mild centrilobular emphysematous changes. No pleural  effusion or pneumothorax.  Visualized thyroid is unremarkable.  The heart is normal in size. No pericardial effusion. Coronary  atherosclerosis. Atherosclerotic calcifications of the aortic  arch.  Small mediastinal lymph nodes measuring up to 9 mm short axis  (series 2/image 22), unchanged. No suspicious hilar or axillary  lymphadenopathy.  Visualized upper abdomen is unremarkable.  Mild degenerative changes of the visualized thoracolumbar spine.  IMPRESSION:  Status post left upper lobectomy. No evidence of recurrent or  metastatic  disease in the chest.  Stable subpleural nodularity in the lateral left lower lobe.  Minimal patchy opacity in the medial right upper lobe, new,  possibly infectious/inflammatory.  Original Report Authenticated By: Charline Bills, M.D.   05/27/2012  *RADIOLOGY REPORT*  Clinical Data: Prior left upper lobectomy for left upper lobe lung carcinoma, follow-up  CHEST - 2 VIEW  Comparison: CT chest of 04/19/2012 and chest x-ray of 10/09/2011  Findings:  Changes of prior left upper lobectomy are stable.  There is minimal volume loss on the left.  No lung nodule, infiltrate or effusion is seen.  Mediastinal contours are stable.  The heart is within normal limits in size.  No bony abnormality is seen.    IMPRESSION: Stable postoperative changes on the left.  No evidence of recurrence.   Original Report Authenticated By: [name], M.D.   RADIOLOGY REPORT*  Clinical Data: Lung cancer diagnosed 02/2010 post resection,  prostate cancer diagnosed 07/2010 status post resection, right  ocular cancer diagnosed 2009 status post chemotherapy  CT CHEST WITH CONTRAST  Technique: Multidetector CT imaging of the chest was performed  following the standard protocol during bolus administration of  intravenous contrast.  Contrast: 80mL OMNIPAQUE IOHEXOL 300 MG/ML SOLN  Comparison: 07/25/2011  Findings: Status post right upper lobectomy. 1.8 x 0.9 cm  subpleural opacity in the lateral left lower lobe (series 5/image  39), previously 1.6 x 0.8 cm, grossly unchanged from prior studies  since 2011. Additional mild nodularity along the right minor  fissure (series 5/image 28), unchanged. No pleural effusion or  pneumothorax.  Visualized thyroid is unremarkable.  Heart is normal in size. No pericardial effusion. Coronary  atherosclerosis. Atherosclerotic calcifications of the aortic  arch.  No suspicious mediastinal, hilar, or axillary lymphadenopathy.  Visualized upper abdomen is unremarkable.  Mild degenerative  changes of the visualized thoracolumbar spine.  IMPRESSION:  Status post right upper lobectomy.  No evidence of metastatic disease in the chest.  1.8 x 0.9 cm subpleural opacity in the lateral left lower lobe,  grossly unchanged.  Original Report Authenticated By: Charline Bills, M.D.   07/25/2011  CT CHEST WITH CONTRAST  Technique: Multidetector CT imaging of the chest was performed  following the standard protocol during bolus administration of  intravenous contrast.  Contrast: 80mL OMNIPAQUE IOHEXOL 300 MG/ML IV SOLN  Comparison: 01/17/2011. PET CT from 01/27/2011.  Findings: There is no axillary lymphadenopathy. A small lymph  nodes are seen in the mediastinum and right hilum. No overt  lymphadenopathy in the chest. Several small lymph nodes along the  distal esophagus are stable. Heart size is normal. Coronary  artery calcification is evident.  8 x 16 mm ill-defined left subpleural nodule measured 10 x 16 mm  previously. This nodule had borderline F D G uptake on recent PET  scan.  Bone windows reveal no worrisome lytic or sclerotic osseous  lesions.  IMPRESSION:  Stable subpleural nodule the left lung in the 73-month interval  since the prior chest CT. No new or progressive disease.  Original Report Authenticated By: [name], M.D.  Recent Labs: Lab Results  Component Value Date   WBC 8.0 02/01/2013   HGB 13.2 02/01/2013   HCT 38.7 02/01/2013   PLT 304 02/01/2013   GLUCOSE 106* 02/01/2013   ALT 18 02/01/2013   AST 16 02/01/2013   NA 140 02/01/2013   K 4.4 02/01/2013   CL 108* 02/01/2013   CREATININE 1.2 02/01/2013   BUN 22.9 02/01/2013   CO2 22 02/01/2013   INR 0.94 03/19/2010      Assessment / Plan:   Pateint seen for fu of surgical reaseciotn of Stage I carcinoma of the lung The patient is doing well without any significant symptoms working full-time.  His followup CT scan showed postoperative scarring but no evidence of recurrent disease.  Oncology is getting  scan in May  2014. I will see in one year, with chest xray.     [name] MD 07/14/2013 12:14 PM

## 2013-07-15 ENCOUNTER — Ambulatory Visit (INDEPENDENT_AMBULATORY_CARE_PROVIDER_SITE_OTHER): Payer: BC Managed Care – PPO | Admitting: Urology

## 2013-07-15 DIAGNOSIS — Z8546 Personal history of malignant neoplasm of prostate: Secondary | ICD-10-CM

## 2013-07-15 DIAGNOSIS — N529 Male erectile dysfunction, unspecified: Secondary | ICD-10-CM

## 2013-07-15 DIAGNOSIS — N393 Stress incontinence (female) (male): Secondary | ICD-10-CM

## 2013-07-21 ENCOUNTER — Other Ambulatory Visit: Payer: Self-pay

## 2013-09-23 DIAGNOSIS — H2510 Age-related nuclear cataract, unspecified eye: Secondary | ICD-10-CM | POA: Diagnosis not present

## 2013-09-23 DIAGNOSIS — H1810 Bullous keratopathy, unspecified eye: Secondary | ICD-10-CM | POA: Diagnosis not present

## 2013-11-04 ENCOUNTER — Telehealth: Payer: Self-pay | Admitting: Internal Medicine

## 2013-11-04 NOTE — Telephone Encounter (Signed)
s.w. pt and advised that 5.25.15 appt has been moved to 5.26.15 [facility]

## 2013-12-30 DIAGNOSIS — Z79899 Other long term (current) drug therapy: Secondary | ICD-10-CM | POA: Diagnosis not present

## 2013-12-30 DIAGNOSIS — E785 Hyperlipidemia, unspecified: Secondary | ICD-10-CM | POA: Diagnosis not present

## 2013-12-30 DIAGNOSIS — K2100 Gastro-esophageal reflux disease with esophagitis, without bleeding: Secondary | ICD-10-CM | POA: Diagnosis not present

## 2014-01-06 DIAGNOSIS — E785 Hyperlipidemia, unspecified: Secondary | ICD-10-CM | POA: Diagnosis not present

## 2014-01-06 DIAGNOSIS — N402 Nodular prostate without lower urinary tract symptoms: Secondary | ICD-10-CM | POA: Diagnosis not present

## 2014-01-06 DIAGNOSIS — Z0000 Encounter for general adult medical examination without abnormal findings: Secondary | ICD-10-CM | POA: Diagnosis not present

## 2014-01-06 DIAGNOSIS — I10 Essential (primary) hypertension: Secondary | ICD-10-CM | POA: Diagnosis not present

## 2014-01-06 DIAGNOSIS — C3490 Malignant neoplasm of unspecified part of unspecified bronchus or lung: Secondary | ICD-10-CM | POA: Diagnosis not present

## 2014-02-01 ENCOUNTER — Ambulatory Visit (HOSPITAL_COMMUNITY): Payer: Medicare Other

## 2014-02-01 ENCOUNTER — Other Ambulatory Visit (HOSPITAL_BASED_OUTPATIENT_CLINIC_OR_DEPARTMENT_OTHER): Payer: Medicare Other

## 2014-02-01 ENCOUNTER — Ambulatory Visit (HOSPITAL_COMMUNITY)
Admission: RE | Admit: 2014-02-01 | Discharge: 2014-02-01 | Disposition: A | Discharge status: Home / Self Care | Payer: Medicare Other | Source: Ambulatory Visit | Attending: Internal Medicine | Admitting: Internal Medicine

## 2014-02-01 ENCOUNTER — Encounter (HOSPITAL_COMMUNITY): Payer: Self-pay

## 2014-02-01 DIAGNOSIS — C61 Malignant neoplasm of prostate: Secondary | ICD-10-CM | POA: Diagnosis not present

## 2014-02-01 DIAGNOSIS — C3410 Malignant neoplasm of upper lobe, unspecified bronchus or lung: Secondary | ICD-10-CM

## 2014-02-01 DIAGNOSIS — C3490 Malignant neoplasm of unspecified part of unspecified bronchus or lung: Secondary | ICD-10-CM | POA: Insufficient documentation

## 2014-02-01 LAB — COMPREHENSIVE METABOLIC PANEL (CC13)
ALT: 19 U/L (ref 0–55)
ANION GAP: 12 meq/L — AB (ref 3–11)
AST: 19 U/L (ref 5–34)
Albumin: 4 g/dL (ref 3.5–5.0)
Alkaline Phosphatase: 67 U/L (ref 40–150)
BUN: 28 mg/dL — AB (ref 7.0–26.0)
CALCIUM: 9.4 mg/dL (ref 8.4–10.4)
CHLORIDE: 106 meq/L (ref 98–109)
CO2: 19 meq/L — AB (ref 22–29)
CREATININE: 1.3 mg/dL (ref 0.7–1.3)
GLUCOSE: 120 mg/dL (ref 70–140)
Potassium: 4.1 mEq/L (ref 3.5–5.1)
Sodium: 136 mEq/L (ref 136–145)
Total Bilirubin: 0.46 mg/dL (ref 0.20–1.20)
Total Protein: 7.4 g/dL (ref 6.4–8.3)

## 2014-02-01 LAB — CBC WITH DIFFERENTIAL/PLATELET
BASO%: 1.4 % (ref 0.0–2.0)
BASOS ABS: 0.1 10*3/uL (ref 0.0–0.1)
EOS ABS: 0.2 10*3/uL (ref 0.0–0.5)
EOS%: 2.3 % (ref 0.0–7.0)
HEMATOCRIT: 39.6 % (ref 38.4–49.9)
HEMOGLOBIN: 13.5 g/dL (ref 13.0–17.1)
LYMPH#: 2.3 10*3/uL (ref 0.9–3.3)
LYMPH%: 32.3 % (ref 14.0–49.0)
MCH: 31.5 pg (ref 27.2–33.4)
MCHC: 34.2 g/dL (ref 32.0–36.0)
MCV: 92.3 fL (ref 79.3–98.0)
MONO#: 0.7 10*3/uL (ref 0.1–0.9)
MONO%: 9.6 % (ref 0.0–14.0)
NEUT%: 54.4 % (ref 39.0–75.0)
NEUTROS ABS: 3.9 10*3/uL (ref 1.5–6.5)
Platelets: 266 10*3/uL (ref 140–400)
RBC: 4.29 10*6/uL (ref 4.20–5.82)
RDW: 12.8 % (ref 11.0–14.6)
WBC: 7.2 10*3/uL (ref 4.0–10.3)

## 2014-02-06 ENCOUNTER — Ambulatory Visit: Payer: BC Managed Care – PPO | Admitting: Internal Medicine

## 2014-02-07 ENCOUNTER — Ambulatory Visit (HOSPITAL_BASED_OUTPATIENT_CLINIC_OR_DEPARTMENT_OTHER): Payer: Medicare Other | Admitting: Internal Medicine

## 2014-02-07 ENCOUNTER — Encounter: Payer: Self-pay | Admitting: Internal Medicine

## 2014-02-07 ENCOUNTER — Telehealth: Payer: Self-pay | Admitting: Internal Medicine

## 2014-02-07 VITALS — BP 130/80 | HR 73 | Temp 98.1°F | Resp 18 | Wt 178.0 lb

## 2014-02-07 DIAGNOSIS — C3410 Malignant neoplasm of upper lobe, unspecified bronchus or lung: Secondary | ICD-10-CM

## 2014-02-07 DIAGNOSIS — C3490 Malignant neoplasm of unspecified part of unspecified bronchus or lung: Secondary | ICD-10-CM

## 2014-02-07 NOTE — Progress Notes (Signed)
[place] Telephone:(336) [phone]   Fax:(336) [place], MD [address] [place] [address]  PRINCIPAL DIAGNOSIS: Stage 1A (T1b N0 M0) non-small cell lung cancer, squamous cell carcinoma diagnosed in June, 2011.   PRIOR THERAPY: Status post left upper lobectomy with lymph node dissection under the care of Dr. Servando Snare on March 20, 2010.   CURRENT THERAPY: Observation.  INTERVAL HISTORY: [name] Short 68 y.o. male returns to the clinic today for ollow up visit accompanied by his wife. The patient is feeling fine today with no specific complaints. He has no fever or chills, no nausea or vomiting. He denied having any significant chest pain, shortness of breath except with exertion, cough or hemoptysis. He has no weight loss or night sweats. The patient had repeat CT scan of the chest performed recently and he is here for evaluation and discussion of his scan results.  MEDICAL HISTORY: Past Medical History  Diagnosis Date  . Hypertension   . Shortness of breath     ONLY WITH EXERTION; HX OF LOBECTOMY FOR CANER  . COPD (chronic obstructive pulmonary disease)     FORMER SMOKER  . Pneumonia 2011  . GERD (gastroesophageal reflux disease)   . Arthritis     ESP IN HANDS  . Prostate cancer     prostate ca dx 11/11  . Lung cancer     lung ca dx 02/2010  . Cancer     OCCULAR RIGHT EYE--SURGERY AND CHEMO  PT IS LEGALLY BLIND IN RT EYE  . Erectile dysfunction   . Glaucoma     ONLY IN RIGHT EYE  . Cataract     LEFT EYE    ALLERGIES:  has No Known Allergies.  MEDICATIONS:  Current Outpatient Prescriptions  Medication Sig Dispense Refill  . acetaminophen (TYLENOL) 650 MG CR tablet Take 650 mg by mouth every 8 (eight) hours as needed.      . brimonidine (ALPHAGAN) 0.15 % ophthalmic solution Place 1 drop into the right eye 3 (three) times daily.      . lansoprazole (PREVACID) 30 MG capsule Take 30 mg by mouth  daily.      [name] losartan (COZAAR) 100 MG tablet Take 100 mg by mouth daily.      [name] NIFEdipine (ADALAT CC) 90 MG 24 hr tablet Take 90 mg by mouth daily.      [name] UNABLE TO FIND Muro 128 eye drops OTC, use in right eye BID       No current facility-administered medications for this visit.    SURGICAL HISTORY:  Past Surgical History  Procedure Laterality Date  . Eye surgery      OCT 1999 DETACHED RETINA -RIGHT; MAY AND JUNE 2009 RIGHT EYE SURGERY FOR CANCER-LENS HAS BEEN REMOVED.  [name] Left upper lobectomy  03/2010    FOR CANCER  . Prostatectomy  DEC 2011    FOR CANCER  . Appendectomy  JUNE 2000  . Colonoscopy  08/06/2012    Procedure: COLONOSCOPY;  Surgeon: Rogene Houston, MD;  Location: [facility];  Service: Endoscopy;  Laterality: N/A;  955  . Penile prosthesis implant  08/10/2012    Procedure: PENILE PROTHESIS INFLATABLE;  Surgeon: Malka So, MD;  Location: [facility];  Service: Urology;  Laterality: N/A;  IMPLANT 3 PIECE PENILE PROSTHESIS     REVIEW OF SYSTEMS:  A comprehensive review of systems was negative.  PHYSICAL EXAMINATION: General appearance: alert, cooperative and no distress Head: Normocephalic, without obvious abnormality, atraumatic Neck: no adenopathy Lymph nodes: Cervical, supraclavicular, and axillary nodes normal. Resp: clear to auscultation bilaterally Cardio: regular rate and rhythm, S1, S2 normal, no murmur, click, rub or gallop GI: soft, non-tender; bowel sounds normal; no masses,  no organomegaly Extremities: extremities normal, atraumatic, no cyanosis or edema  ECOG PERFORMANCE STATUS: 0 - Asymptomatic  There were no vitals taken for this visit.  LABORATORY DATA: Lab Results  Component Value Date   WBC 7.2 02/01/2014   HGB 13.5 02/01/2014   HCT 39.6 02/01/2014   MCV 92.3 02/01/2014   PLT 266 02/01/2014      Chemistry      Component Value Date/Time   NA 136 02/01/2014 0803   NA 137 08/05/2012 1005   NA 142 04/19/2012 0835   K 4.1 02/01/2014 0803   K  3.9 08/05/2012 1005   K 4.2 04/19/2012 0835   CL 108* 02/01/2013 0758   CL 101 08/05/2012 1005   CL 106 04/19/2012 0835   CO2 19* 02/01/2014 0803   CO2 23 08/05/2012 1005   CO2 24 04/19/2012 0835   BUN 28.0* 02/01/2014 0803   BUN 21 08/05/2012 1005   BUN 30* 04/19/2012 0835   CREATININE 1.3 02/01/2014 0803   CREATININE 0.93 08/05/2012 1005   CREATININE 1.4* 04/19/2012 0835      Component Value Date/Time   CALCIUM 9.4 02/01/2014 0803   CALCIUM 9.5 08/05/2012 1005   CALCIUM 9.1 04/19/2012 0835   ALKPHOS 67 02/01/2014 0803   ALKPHOS 80 04/19/2012 0835   ALKPHOS 87 07/19/2010 0813   AST 19 02/01/2014 0803   AST 26 04/19/2012 0835   AST 23 07/19/2010 0813   ALT 19 02/01/2014 0803   ALT 30 04/19/2012 0835   ALT 23 07/19/2010 0813   BILITOT 0.46 02/01/2014 0803   BILITOT 0.60 04/19/2012 0835   BILITOT 0.3 07/19/2010 0813       RADIOGRAPHIC STUDIES: Ct Chest Wo Contrast  02/01/2014   CLINICAL DATA:  Lung cancer and prostate cancer. Shortness of breath.  EXAM: CT CHEST WITHOUT CONTRAST  TECHNIQUE: Multidetector CT imaging of the chest was performed following the standard protocol without IV contrast.  COMPARISON:  02/01/2013, 10/28/2012 in 04/19/2012.  FINDINGS: No pathologically enlarged mediastinal lymph nodes. Hilar regions are difficult to definitively evaluate without IV contrast. No axillary adenopathy. Atherosclerotic calcification of the arterial vasculature, including three-vessel involvement of the coronary arteries. Heart size normal. No pericardial effusion.  Mild centrilobular emphysema. Peribronchovascular ground-glass in the right upper lobe, as seen on the prior exam, has resolved in the interval. Mild subpleural nodularity in the right upper lobe, unchanged. Postoperative changes of left upper lobectomy. Mild scarring in the left lower lobe with associated pleural thickening (image 38), unchanged from 10/28/2012. No pleural fluid. Airway is otherwise unremarkable.  Incidental imaging of the upper abdomen  shows mild irregularity of the liver margin. Visualized portions of the liver, adrenal glands, left kidney, spleen, stomach and bowel are otherwise grossly unremarkable. No upper abdominal adenopathy. No worrisome lytic or sclerotic lesions. Degenerative changes are seen in the spine. Old right rib fracture.  IMPRESSION: 1. Postoperative changes of left upper lobectomy without evidence of recurrent or metastatic disease. 2. Three-vessel coronary artery calcification. 3. Question mild cirrhosis.   Electronically Signed   By: Lorin Picket M.D.   On: 02/01/2014 09:38   ASSESSMENT AND PLAN: this is a very pleasant 68 years old white  male with history of stage IA non-small cell lung cancer status post left upper lobectomy with lymph node dissection and has been observation since July 2011 with no evidence for disease progression. I discussed the scan results with the patient and his wife. I recommended for him to continue on observation with repeat CT scan of the chest in one year. The patient was advised to call immediately if he has any  Concerning symptoms in the interval. The previous suspicious lesion in the right middle lobe was most likely inflammatory.   All questions were answered. The patient knows to call the clinic with any problems, questions or concerns. We can certainly see the patient much sooner if necessary.  Disclaimer: This note was dictated with voice recognition software. Similar sounding words can inadvertently be transcribed and may not be corrected upon review.

## 2014-02-07 NOTE — Telephone Encounter (Signed)
gv adnp rinted appt sched and avs for pt fro May 2016

## 2014-03-24 DIAGNOSIS — I10 Essential (primary) hypertension: Secondary | ICD-10-CM | POA: Diagnosis not present

## 2014-03-30 DIAGNOSIS — H1810 Bullous keratopathy, unspecified eye: Secondary | ICD-10-CM | POA: Diagnosis not present

## 2014-03-30 DIAGNOSIS — D3130 Benign neoplasm of unspecified choroid: Secondary | ICD-10-CM | POA: Diagnosis not present

## 2014-03-30 DIAGNOSIS — H2510 Age-related nuclear cataract, unspecified eye: Secondary | ICD-10-CM | POA: Diagnosis not present

## 2014-07-04 ENCOUNTER — Other Ambulatory Visit: Payer: Self-pay | Admitting: Cardiothoracic Surgery

## 2014-07-04 DIAGNOSIS — C3412 Malignant neoplasm of upper lobe, left bronchus or lung: Secondary | ICD-10-CM

## 2014-07-06 ENCOUNTER — Ambulatory Visit (INDEPENDENT_AMBULATORY_CARE_PROVIDER_SITE_OTHER): Payer: Medicare Other | Admitting: Cardiothoracic Surgery

## 2014-07-06 ENCOUNTER — Encounter: Payer: Self-pay | Admitting: Cardiothoracic Surgery

## 2014-07-06 ENCOUNTER — Ambulatory Visit
Admission: RE | Admit: 2014-07-06 | Discharge: 2014-07-06 | Disposition: A | Discharge status: Home / Self Care | Payer: Medicare Other | Source: Ambulatory Visit | Attending: Cardiothoracic Surgery | Admitting: Cardiothoracic Surgery

## 2014-07-06 VITALS — BP 116/76 | HR 76 | Resp 20 | Ht 67.0 in | Wt 177.0 lb

## 2014-07-06 DIAGNOSIS — C3412 Malignant neoplasm of upper lobe, left bronchus or lung: Secondary | ICD-10-CM

## 2014-07-06 DIAGNOSIS — S299XXA Unspecified injury of thorax, initial encounter: Secondary | ICD-10-CM | POA: Diagnosis not present

## 2014-07-06 DIAGNOSIS — J069 Acute upper respiratory infection, unspecified: Secondary | ICD-10-CM | POA: Diagnosis not present

## 2014-07-06 DIAGNOSIS — S20212A Contusion of left front wall of thorax, initial encounter: Secondary | ICD-10-CM | POA: Diagnosis not present

## 2014-07-06 DIAGNOSIS — Z85118 Personal history of other malignant neoplasm of bronchus and lung: Secondary | ICD-10-CM | POA: Diagnosis not present

## 2014-07-06 DIAGNOSIS — R0789 Other chest pain: Secondary | ICD-10-CM | POA: Diagnosis not present

## 2014-07-06 NOTE — Progress Notes (Signed)
[address]       ,[place] [address]             [phone]                        [name] Short [place] Medical Record #[id number] Date of Birth: 10-Aug-1946  Asencion Noble, MD Dr Inda Merlin   Chief Complaint:   PostOp Follow Up Visit History of Present Illness:      The patient returns to the office today in followup after a  left video-assisted thoracoscopy and left upper lobectomy for a squamous  cell carcinoma 03/20/2010.He had T2N0M0 carcinoma of  the left upper lobe.  He notes that he has been doing very well. Working full time. Currently he drives 80 miles a day no work. He notes just today in the dark he tripped over a coiled up pipe and fell against a curb. Last night he noted some tenderness of the left lower ribs but it is better this morning. A chest x-ray has been obtained  Malignant neoplasm of bronchus and lung, left upper lobe   Primary site: Lung   Staging method: AJCC 7th Edition   Pathologic free text: [id number]    Zubrod Score: [x]  Normal activity, no symptoms []  Symptoms, fully ambulatory []  Symptoms, in bed less than or equal to 50% of the time []  Symptoms, in bed greater than 50% of the time but less than 100% []  Bedridden []  Moribund    History  Smoking status  . Former Smoker -- 1.50 packs/day for 40 years  . Quit date: 09/07/2008  Smokeless tobacco  . Former Systems developer  . Types: Chew  . Quit date: 10/08/1985       No Known Allergies  Current Outpatient Prescriptions  Medication Sig Dispense Refill  . acetaminophen (TYLENOL) 650 MG CR tablet Take 650 mg by mouth every 8 (eight) hours as needed.      . brimonidine (ALPHAGAN) 0.15 % ophthalmic solution Place 1 drop into the right eye 3 (three) times daily.      . chlorthalidone (HYGROTON) 25 MG tablet Take 25 mg by mouth daily.      . lansoprazole (PREVACID) 30 MG capsule Take 30 mg by mouth daily.      [name] losartan (COZAAR) 100 MG tablet Take 100 mg by mouth daily.       [name] NIFEdipine (ADALAT CC) 90 MG 24 hr tablet Take 90 mg by mouth daily.       No current facility-administered medications for this visit.       Physical Exam: BP 116/76  Pulse 76  Resp 20  Ht 5\' 7"  (1.702 m)  Wt 177 lb (80.287 kg)  BMI 27.72 kg/m2  SpO2 98%  General appearance: alert and cooperative Neurologic: intact Heart: regular rate and rhythm, S1, S2 normal, no murmur, click, rub or gallop Lungs: clear to auscultation bilaterally Abdomen: soft, non-tender; bowel sounds normal; no masses,  no organomegaly, he has ventral diastasis , no palpable AAA Extremities: extremities normal, atraumatic, no cyanosis or edema and Homans sign is negative, no sign of DVT Wound: Patient's chest incision is well-healed there is no cervical orsupraclavicular adenopathy The patient has very mild tenderness over the left lower chest ribs.  Diagnostic Studies & Laboratory data:         Recent Radiology Findings: Dg Chest 2 View  07/06/2014   CLINICAL DATA:  Left upper lung cancer.  Partial left upper lobectomy. Fall with left chest injury yesterday. Anterior left upper chest pain. Cough. Shortness of breath. Upper respiratory infection. Initial assessment.  EXAM: CHEST  2 VIEW  COMPARISON:  02/01/2014  FINDINGS: Atherosclerotic aortic arch. Emphysema. Postoperative findings in the left chest.  No pneumothorax or pleural effusion. Thoracic spondylosis. Chronic pleural thickening laterally near the left lung base. Chronic left lower lateral rib deformities.  IMPRESSION: 1. Chronic postoperative findings in the left chest. No acute abnormalities observed. 2. Atherosclerotic aortic arch. 3. A emphysema.   Electronically Signed   By: Sherryl Barters M.D.   On: 07/06/2014 08:45   02/01/2013:RADIOLOGY REPORT*  Clinical Data: Lung cancer diagnosed 02/2010 status post left lung  resection. Right eye cancer diagnosed 2009, status post  chemotherapy drops. Prostate cancer diagnosed 07/2010.  CT CHEST WITH  CONTRAST  Technique: Multidetector CT imaging of the chest was performed  following the standard protocol during bolus administration of  intravenous contrast.  Contrast: 75mL OMNIPAQUE IOHEXOL 300 MG/ML SOLN  Comparison: 10/28/2012 and 04/19/2012.  Findings: Status post left upper lobectomy.  Subpleural nodularity in the lateral left lower lobe, measuring 2.1  x 0.9 cm (series 5/image 39), unchanged from 2013. Small nodular  thickening along the right major fissure (series 5/image 27),  unchanged.  Minimal patchy opacity in the medial right upper lobe (series  5/images 12-13), new, possibly infectious/inflammatory.  Underlying mild centrilobular emphysematous changes. No pleural  effusion or pneumothorax.  Visualized thyroid is unremarkable.  The heart is normal in size. No pericardial effusion. Coronary  atherosclerosis. Atherosclerotic calcifications of the aortic  arch.  Small mediastinal lymph nodes measuring up to 9 mm short axis  (series 2/image 22), unchanged. No suspicious hilar or axillary  lymphadenopathy.  Visualized upper abdomen is unremarkable.  Mild degenerative changes of the visualized thoracolumbar spine.  IMPRESSION:  Status post left upper lobectomy. No evidence of recurrent or  metastatic disease in the chest.  Stable subpleural nodularity in the lateral left lower lobe.  Minimal patchy opacity in the medial right upper lobe, new,  possibly infectious/inflammatory.  Original Report Authenticated By: Julian Hy, M.D.   05/27/2012  *RADIOLOGY REPORT*  Clinical Data: Prior left upper lobectomy for left upper lobe lung carcinoma, follow-up  CHEST - 2 VIEW  Comparison: CT chest of 04/19/2012 and chest x-ray of 10/09/2011  Findings:  Changes of prior left upper lobectomy are stable.  There is minimal volume loss on the left.  No lung nodule, infiltrate or effusion is seen.  Mediastinal contours are stable.  The heart is within normal limits in size.  No bony  abnormality is seen.    IMPRESSION: Stable postoperative changes on the left.  No evidence of recurrence.   Original Report Authenticated By: Joretta Bachelor, M.D.   RADIOLOGY REPORT*  Clinical Data: Lung cancer diagnosed 02/2010 post resection,  prostate cancer diagnosed 07/2010 status post resection, right  ocular cancer diagnosed 2009 status post chemotherapy  CT CHEST WITH CONTRAST  Technique: Multidetector CT imaging of the chest was performed  following the standard protocol during bolus administration of  intravenous contrast.  Contrast: 51mL OMNIPAQUE IOHEXOL 300 MG/ML SOLN  Comparison: 07/25/2011  Findings: Status post right upper lobectomy. 1.8 x 0.9 cm  subpleural opacity in the lateral left lower lobe (series 5/image  39), previously 1.6 x 0.8 cm, grossly unchanged from prior studies  since 2011. Additional mild nodularity along the right minor  fissure (series 5/image 28), unchanged. No pleural effusion or  pneumothorax.  Visualized thyroid is unremarkable.  Heart is normal in size. No pericardial effusion. Coronary  atherosclerosis. Atherosclerotic calcifications of the aortic  arch.  No suspicious mediastinal, hilar, or axillary lymphadenopathy.  Visualized upper abdomen is unremarkable.  Mild degenerative changes of the visualized thoracolumbar spine.  IMPRESSION:  Status post right upper lobectomy.  No evidence of metastatic disease in the chest.  1.8 x 0.9 cm subpleural opacity in the lateral left lower lobe,  grossly unchanged.  Original Report Authenticated By: Julian Hy, M.D.   07/25/2011  CT CHEST WITH CONTRAST  Technique: Multidetector CT imaging of the chest was performed  following the standard protocol during bolus administration of  intravenous contrast.  Contrast: 40mL OMNIPAQUE IOHEXOL 300 MG/ML IV SOLN  Comparison: 01/17/2011. PET CT from 01/27/2011.  Findings: There is no axillary lymphadenopathy. A small lymph  nodes are seen in the  mediastinum and right hilum. No overt  lymphadenopathy in the chest. Several small lymph nodes along the  distal esophagus are stable. Heart size is normal. Coronary  artery calcification is evident.  8 x 16 mm ill-defined left subpleural nodule measured 10 x 16 mm  previously. This nodule had borderline F D G uptake on recent PET  scan.  Bone windows reveal no worrisome lytic or sclerotic osseous  lesions.  IMPRESSION:  Stable subpleural nodule the left lung in the 7-month interval  since the prior chest CT. No new or progressive disease.  Original Report Authenticated By: [name], M.D.  Recent Labs: Lab Results  Component Value Date   WBC 7.2 02/01/2014   HGB 13.5 02/01/2014   HCT 39.6 02/01/2014   PLT 266 02/01/2014   GLUCOSE 120 02/01/2014   ALT 19 02/01/2014   AST 19 02/01/2014   NA 136 02/01/2014   K 4.1 02/01/2014   CL 108* 02/01/2013   CREATININE 1.3 02/01/2014   BUN 28.0* 02/01/2014   CO2 19* 02/01/2014   INR 0.94 03/19/2010      Assessment / Plan:   Pateint seen for fu of surgical reaseciotn of Stage I carcinoma of the lung 4years 3 months after resection The patient is doing well without any significant symptoms working full-time.  His followup CT scan showed postoperative scarring but no evidence of recurrent disease 01/2014 No evidence of rib fracture on chest x-ray today  Oncology is getting CT scan of the chest scan in May  2016.      [name] Isaac MD 07/06/2014 10:46 AM

## 2014-07-07 DIAGNOSIS — Z8546 Personal history of malignant neoplasm of prostate: Secondary | ICD-10-CM | POA: Diagnosis not present

## 2014-07-07 DIAGNOSIS — N5201 Erectile dysfunction due to arterial insufficiency: Secondary | ICD-10-CM | POA: Diagnosis not present

## 2014-07-13 ENCOUNTER — Ambulatory Visit: Payer: PRIVATE HEALTH INSURANCE | Admitting: Cardiothoracic Surgery

## 2014-07-14 ENCOUNTER — Ambulatory Visit (INDEPENDENT_AMBULATORY_CARE_PROVIDER_SITE_OTHER): Payer: Medicare Other | Admitting: Urology

## 2014-07-14 DIAGNOSIS — N390 Urinary tract infection, site not specified: Secondary | ICD-10-CM

## 2014-07-14 DIAGNOSIS — N5201 Erectile dysfunction due to arterial insufficiency: Secondary | ICD-10-CM

## 2014-07-14 DIAGNOSIS — Z8546 Personal history of malignant neoplasm of prostate: Secondary | ICD-10-CM

## 2014-07-14 DIAGNOSIS — N393 Stress incontinence (female) (male): Secondary | ICD-10-CM | POA: Diagnosis not present

## 2014-07-14 DIAGNOSIS — H01139 Eczematous dermatitis of unspecified eye, unspecified eyelid: Secondary | ICD-10-CM | POA: Diagnosis not present

## 2014-07-14 DIAGNOSIS — I10 Essential (primary) hypertension: Secondary | ICD-10-CM | POA: Diagnosis not present

## 2014-07-14 DIAGNOSIS — Z23 Encounter for immunization: Secondary | ICD-10-CM | POA: Diagnosis not present

## 2014-10-05 DIAGNOSIS — H524 Presbyopia: Secondary | ICD-10-CM | POA: Diagnosis not present

## 2014-10-05 DIAGNOSIS — H1811 Bullous keratopathy, right eye: Secondary | ICD-10-CM | POA: Diagnosis not present

## 2014-10-05 DIAGNOSIS — H40013 Open angle with borderline findings, low risk, bilateral: Secondary | ICD-10-CM | POA: Diagnosis not present

## 2014-10-05 DIAGNOSIS — H2512 Age-related nuclear cataract, left eye: Secondary | ICD-10-CM | POA: Diagnosis not present

## 2015-01-05 DIAGNOSIS — R7301 Impaired fasting glucose: Secondary | ICD-10-CM | POA: Diagnosis not present

## 2015-01-05 DIAGNOSIS — K219 Gastro-esophageal reflux disease without esophagitis: Secondary | ICD-10-CM | POA: Diagnosis not present

## 2015-01-05 DIAGNOSIS — E785 Hyperlipidemia, unspecified: Secondary | ICD-10-CM | POA: Diagnosis not present

## 2015-01-05 DIAGNOSIS — I10 Essential (primary) hypertension: Secondary | ICD-10-CM | POA: Diagnosis not present

## 2015-01-05 DIAGNOSIS — Z79899 Other long term (current) drug therapy: Secondary | ICD-10-CM | POA: Diagnosis not present

## 2015-01-12 DIAGNOSIS — I10 Essential (primary) hypertension: Secondary | ICD-10-CM | POA: Diagnosis not present

## 2015-01-12 DIAGNOSIS — R7301 Impaired fasting glucose: Secondary | ICD-10-CM | POA: Diagnosis not present

## 2015-01-12 DIAGNOSIS — E785 Hyperlipidemia, unspecified: Secondary | ICD-10-CM | POA: Diagnosis not present

## 2015-01-12 DIAGNOSIS — D0220 Carcinoma in situ of unspecified bronchus and lung: Secondary | ICD-10-CM | POA: Diagnosis not present

## 2015-01-30 ENCOUNTER — Encounter (HOSPITAL_COMMUNITY): Payer: Self-pay

## 2015-01-30 ENCOUNTER — Ambulatory Visit (HOSPITAL_COMMUNITY)
Admission: RE | Admit: 2015-01-30 | Discharge: 2015-01-30 | Disposition: A | Discharge status: Home / Self Care | Payer: Medicare Other | Source: Ambulatory Visit | Attending: Internal Medicine | Admitting: Internal Medicine

## 2015-01-30 ENCOUNTER — Other Ambulatory Visit (HOSPITAL_BASED_OUTPATIENT_CLINIC_OR_DEPARTMENT_OTHER): Payer: Medicare Other

## 2015-01-30 DIAGNOSIS — Z87891 Personal history of nicotine dependence: Secondary | ICD-10-CM | POA: Insufficient documentation

## 2015-01-30 DIAGNOSIS — C3490 Malignant neoplasm of unspecified part of unspecified bronchus or lung: Secondary | ICD-10-CM

## 2015-01-30 LAB — CBC WITH DIFFERENTIAL/PLATELET
BASO%: 1.7 % (ref 0.0–2.0)
BASOS ABS: 0.1 10*3/uL (ref 0.0–0.1)
EOS%: 3.2 % (ref 0.0–7.0)
Eosinophils Absolute: 0.2 10*3/uL (ref 0.0–0.5)
HEMATOCRIT: 38.7 % (ref 38.4–49.9)
HGB: 13.5 g/dL (ref 13.0–17.1)
LYMPH#: 1.9 10*3/uL (ref 0.9–3.3)
LYMPH%: 27.5 % (ref 14.0–49.0)
MCH: 31.6 pg (ref 27.2–33.4)
MCHC: 34.9 g/dL (ref 32.0–36.0)
MCV: 90.7 fL (ref 79.3–98.0)
MONO#: 0.7 10*3/uL (ref 0.1–0.9)
MONO%: 9.9 % (ref 0.0–14.0)
NEUT#: 3.9 10*3/uL (ref 1.5–6.5)
NEUT%: 57.7 % (ref 39.0–75.0)
PLATELETS: 305 10*3/uL (ref 140–400)
RBC: 4.26 10*6/uL (ref 4.20–5.82)
RDW: 13.3 % (ref 11.0–14.6)
WBC: 6.8 10*3/uL (ref 4.0–10.3)

## 2015-01-30 LAB — COMPREHENSIVE METABOLIC PANEL (CC13)
ALBUMIN: 4 g/dL (ref 3.5–5.0)
ALT: 18 U/L (ref 0–55)
ANION GAP: 12 meq/L — AB (ref 3–11)
AST: 16 U/L (ref 5–34)
Alkaline Phosphatase: 82 U/L (ref 40–150)
BILIRUBIN TOTAL: 0.31 mg/dL (ref 0.20–1.20)
BUN: 25.6 mg/dL (ref 7.0–26.0)
CALCIUM: 9.2 mg/dL (ref 8.4–10.4)
CO2: 21 meq/L — AB (ref 22–29)
Chloride: 106 mEq/L (ref 98–109)
Creatinine: 1.1 mg/dL (ref 0.7–1.3)
EGFR: 72 mL/min/{1.73_m2} — ABNORMAL LOW (ref >90)
Glucose: 107 mg/dl (ref 70–140)
POTASSIUM: 4.7 meq/L (ref 3.5–5.1)
SODIUM: 140 meq/L (ref 136–145)
Total Protein: 7.3 g/dL (ref 6.4–8.3)

## 2015-02-06 ENCOUNTER — Telehealth: Payer: Self-pay | Admitting: Internal Medicine

## 2015-02-06 ENCOUNTER — Encounter: Payer: Self-pay | Admitting: Internal Medicine

## 2015-02-06 ENCOUNTER — Ambulatory Visit (HOSPITAL_BASED_OUTPATIENT_CLINIC_OR_DEPARTMENT_OTHER): Payer: Medicare Other | Admitting: Internal Medicine

## 2015-02-06 VITALS — BP 134/86 | HR 54 | Temp 97.6°F | Resp 18 | Ht 67.0 in | Wt 180.4 lb

## 2015-02-06 DIAGNOSIS — Z85118 Personal history of other malignant neoplasm of bronchus and lung: Secondary | ICD-10-CM | POA: Diagnosis not present

## 2015-02-06 DIAGNOSIS — C3412 Malignant neoplasm of upper lobe, left bronchus or lung: Secondary | ICD-10-CM

## 2015-02-06 NOTE — Telephone Encounter (Signed)
s.w pt and advised on May 2017 appt....pt ok and aware

## 2015-02-06 NOTE — Progress Notes (Signed)
[place] Telephone:(336) [phone]   Fax:(336) [place], MD [address] [place] [address]  PRINCIPAL DIAGNOSIS: Stage 1A (T1b N0 M0) non-small cell lung cancer, squamous cell carcinoma diagnosed in June, 2011.   PRIOR THERAPY: Status post left upper lobectomy with lymph node dissection under the care of Dr. Servando Snare on March 20, 2010.   CURRENT THERAPY: Observation.  INTERVAL HISTORY: [name] Short 69 y.o. male returns to the clinic today for ollow up visit accompanied by his wife. The patient has been observation for the last 5 years. He is feeling fine today with no specific complaints. He has no fever or chills, no nausea or vomiting. He denied having any significant chest pain, shortness of breath except with exertion, cough or hemoptysis. He has no weight loss or night sweats. The patient had repeat CT scan of the chest performed recently and he is here for evaluation and discussion of his scan results.  MEDICAL HISTORY: Past Medical History  Diagnosis Date  . Hypertension   . Shortness of breath     ONLY WITH EXERTION; HX OF LOBECTOMY FOR CANER  . COPD (chronic obstructive pulmonary disease)     FORMER SMOKER  . Pneumonia 2011  . GERD (gastroesophageal reflux disease)   . Arthritis     ESP IN HANDS  . Prostate cancer     prostate ca dx 11/11  . Lung cancer     lung ca dx 02/2010  . Cancer     OCCULAR RIGHT EYE--SURGERY AND CHEMO  PT IS LEGALLY BLIND IN RT EYE  . Erectile dysfunction   . Glaucoma     ONLY IN RIGHT EYE  . Cataract     LEFT EYE    ALLERGIES:  has No Known Allergies.  MEDICATIONS:  Current Outpatient Prescriptions  Medication Sig Dispense Refill  . acetaminophen (TYLENOL) 650 MG CR tablet Take 650 mg by mouth every 8 (eight) hours as needed.    . brimonidine (ALPHAGAN) 0.15 % ophthalmic solution Place 1 drop into the right eye 3 (three) times daily.    . chlorthalidone (HYGROTON)  25 MG tablet Take 25 mg by mouth daily.    . lansoprazole (PREVACID) 30 MG capsule Take 30 mg by mouth daily.    [name] losartan (COZAAR) 100 MG tablet Take 100 mg by mouth daily.    [name] NIFEdipine (ADALAT CC) 90 MG 24 hr tablet Take 90 mg by mouth daily.     No current facility-administered medications for this visit.    SURGICAL HISTORY:  Past Surgical History  Procedure Laterality Date  . Eye surgery      OCT 1999 DETACHED RETINA -RIGHT; MAY AND JUNE 2009 RIGHT EYE SURGERY FOR CANCER-LENS HAS BEEN REMOVED.  [name] Left upper lobectomy  03/2010    FOR CANCER  . Prostatectomy  DEC 2011    FOR CANCER  . Appendectomy  JUNE 2000  . Colonoscopy  08/06/2012    Procedure: COLONOSCOPY;  Surgeon: Rogene Houston, MD;  [facility];  Service: Endoscopy;  Laterality: N/A;  955  . Penile prosthesis implant  08/10/2012    Procedure: PENILE PROTHESIS INFLATABLE;  Surgeon: Malka So, MD;  Location: [facility];  Service: Urology;  Laterality: N/A;  IMPLANT 3 PIECE PENILE PROSTHESIS     REVIEW OF SYSTEMS:  A comprehensive review of systems was negative.   PHYSICAL EXAMINATION: General appearance: alert, cooperative and no  distress Head: Normocephalic, without obvious abnormality, atraumatic Neck: no adenopathy Lymph nodes: Cervical, supraclavicular, and axillary nodes normal. Resp: clear to auscultation bilaterally Cardio: regular rate and rhythm, S1, S2 normal, no murmur, click, rub or gallop GI: soft, non-tender; bowel sounds normal; no masses,  no organomegaly Extremities: extremities normal, atraumatic, no cyanosis or edema  ECOG PERFORMANCE STATUS: 0 - Asymptomatic  Blood pressure 134/86, pulse 54, temperature 97.6 F (36.4 C), temperature source Oral, resp. rate 18, height '5\' 7"'$  (1.702 m), weight 180 lb 6.4 oz (81.829 kg), SpO2 99 %.  LABORATORY DATA: Lab Results  Component Value Date   WBC 6.8 01/30/2015   HGB 13.5 01/30/2015   HCT 38.7 01/30/2015   MCV 90.7 01/30/2015   PLT 305  01/30/2015      Chemistry      Component Value Date/Time   NA 140 01/30/2015 0748   NA 137 08/05/2012 1005   NA 142 04/19/2012 0835   K 4.7 01/30/2015 0748   K 3.9 08/05/2012 1005   K 4.2 04/19/2012 0835   CL 108* 02/01/2013 0758   CL 101 08/05/2012 1005   CL 106 04/19/2012 0835   CO2 21* 01/30/2015 0748   CO2 23 08/05/2012 1005   CO2 24 04/19/2012 0835   BUN 25.6 01/30/2015 0748   BUN 21 08/05/2012 1005   BUN 30* 04/19/2012 0835   CREATININE 1.1 01/30/2015 0748   CREATININE 0.93 08/05/2012 1005   CREATININE 1.4* 04/19/2012 0835      Component Value Date/Time   CALCIUM 9.2 01/30/2015 0748   CALCIUM 9.5 08/05/2012 1005   CALCIUM 9.1 04/19/2012 0835   ALKPHOS 82 01/30/2015 0748   ALKPHOS 80 04/19/2012 0835   ALKPHOS 87 07/19/2010 0813   AST 16 01/30/2015 0748   AST 26 04/19/2012 0835   AST 23 07/19/2010 0813   ALT 18 01/30/2015 0748   ALT 30 04/19/2012 0835   ALT 23 07/19/2010 0813   BILITOT 0.31 01/30/2015 0748   BILITOT 0.60 04/19/2012 0835   BILITOT 0.3 07/19/2010 0813       RADIOGRAPHIC STUDIES: Ct Chest Wo Contrast  01/30/2015   CLINICAL DATA:  Malignant lung neoplasm.  EXAM: CT CHEST WITHOUT CONTRAST  TECHNIQUE: Multidetector CT imaging of the chest was performed following the standard protocol without IV contrast.  COMPARISON:  02/01/2014  FINDINGS: Mediastinum: Heart size appears normal. There is no pericardial effusion identified. The trachea appears patent and is midline. Normal appearance of the esophagus. There is no mediastinal or hilar adenopathy. Calcified atherosclerotic plaque involves the abdominal aorta as well as the right coronary artery, LAD coronary artery. No mediastinal or hilar adenopathy.  Lungs/Pleura: No pleural effusion noted. Post- operative changes from left upper lobectomy noted. Mild changes of centrilobular emphysema noted. Focal nodular thickening overlying the lateral left lung measures 0.5 by 1.8 cm and appears unchanged from previous  exam, image 38/series 5. No suspicious pulmonary nodule or mass identified.  Upper Abdomen: Incidental imaging within the upper abdomen shows normal appearing adrenal glands. The spleen appears normal. The visualized portions of the liver are unremarkable.  Musculoskeletal: There are no aggressive lytic or sclerotic bone lesions identified.  IMPRESSION: 1. Stable CT of the chest. 2. Status post left upper lobectomy without specific features to suggest recurrent or metastatic disease. 3. Aortic atherosclerosis and multi vessel coronary artery calcification.   Electronically Signed   By: Kerby Moors M.D.   On: 01/30/2015 09:41   ASSESSMENT AND PLAN: this is a very pleasant 68  years old white male with history of stage IA non-small cell lung cancer status post left upper lobectomy with lymph node dissection and has been observation since July 2011 with no evidence for disease progression. I discussed the scan results with the patient and his wife. I recommended for him to continue on observation with repeat CT scan of the chest in one year. The patient was advised to call immediately if he has any concerning symptoms in the interval.  All questions were answered. The patient knows to call the clinic with any problems, questions or concerns. We can certainly see the patient much sooner if necessary.  Disclaimer: This note was dictated with voice recognition software. Similar sounding words can inadvertently be transcribed and may not be corrected upon review.

## 2015-03-27 DIAGNOSIS — M5442 Lumbago with sciatica, left side: Secondary | ICD-10-CM | POA: Diagnosis not present

## 2015-04-06 DIAGNOSIS — M5442 Lumbago with sciatica, left side: Secondary | ICD-10-CM | POA: Diagnosis not present

## 2015-04-13 DIAGNOSIS — M5442 Lumbago with sciatica, left side: Secondary | ICD-10-CM | POA: Diagnosis not present

## 2015-05-09 DIAGNOSIS — M5442 Lumbago with sciatica, left side: Secondary | ICD-10-CM | POA: Diagnosis not present

## 2015-05-29 ENCOUNTER — Encounter (INDEPENDENT_AMBULATORY_CARE_PROVIDER_SITE_OTHER): Payer: Self-pay | Admitting: *Deleted

## 2015-06-06 ENCOUNTER — Other Ambulatory Visit (INDEPENDENT_AMBULATORY_CARE_PROVIDER_SITE_OTHER): Payer: Self-pay | Admitting: *Deleted

## 2015-06-06 DIAGNOSIS — Z86010 Personal history of colonic polyps: Secondary | ICD-10-CM

## 2015-06-27 ENCOUNTER — Telehealth (INDEPENDENT_AMBULATORY_CARE_PROVIDER_SITE_OTHER): Payer: Self-pay | Admitting: *Deleted

## 2015-06-27 DIAGNOSIS — Z1211 Encounter for screening for malignant neoplasm of colon: Secondary | ICD-10-CM

## 2015-06-27 NOTE — Telephone Encounter (Signed)
Patient needs suprep 

## 2015-06-28 MED ORDER — SUPREP BOWEL PREP KIT 17.5-3.13-1.6 GM/177ML PO SOLN: 1.0000 | Freq: Once | ORAL | Status: DC

## 2015-07-06 DIAGNOSIS — Z8546 Personal history of malignant neoplasm of prostate: Secondary | ICD-10-CM | POA: Diagnosis not present

## 2015-07-10 ENCOUNTER — Telehealth (INDEPENDENT_AMBULATORY_CARE_PROVIDER_SITE_OTHER): Payer: Self-pay | Admitting: *Deleted

## 2015-07-10 NOTE — Telephone Encounter (Signed)
Referring MD/PCP: fagan   Procedure: tcs  Reason/Indication:  Hx polyps  Has patient had this procedure before?  Yes, 2009 -- scanned  If so, when, by whom and where?    Is there a family history of colon cancer?  no  Who?  What age when diagnosed?    Is patient diabetic?   no      Does patient have prosthetic heart valve or mechanical valve?  no  Do you have a pacemaker?  no  Has patient ever had endocarditis? no  Has patient had joint replacement within last 12 months?  no  Does patient tend to be constipated or take laxatives? no  Does patient have a history of alcohol/drug use?  no  Is patient on Coumadin, Plavix and/or Aspirin? no  Medications: tylenol arthritis 650 mg daily, omeprazole 20 mg daily, chlorthalidone 12.5 mg daily, procardia 90 mg daily, brimonidine 0.015 unit 1 drop right eye daily, losartan 100 mg daily  Allergies: nkda  Medication Adjustment:   Procedure date & time: 08/08/15 at 1200

## 2015-07-10 NOTE — Telephone Encounter (Signed)
agree

## 2015-07-12 ENCOUNTER — Ambulatory Visit: Payer: PRIVATE HEALTH INSURANCE | Admitting: Cardiothoracic Surgery

## 2015-07-13 ENCOUNTER — Ambulatory Visit (INDEPENDENT_AMBULATORY_CARE_PROVIDER_SITE_OTHER): Payer: Medicare Other | Admitting: Urology

## 2015-07-13 DIAGNOSIS — N5201 Erectile dysfunction due to arterial insufficiency: Secondary | ICD-10-CM

## 2015-07-13 DIAGNOSIS — N390 Urinary tract infection, site not specified: Secondary | ICD-10-CM | POA: Diagnosis not present

## 2015-07-13 DIAGNOSIS — Z8546 Personal history of malignant neoplasm of prostate: Secondary | ICD-10-CM

## 2015-07-13 DIAGNOSIS — N393 Stress incontinence (female) (male): Secondary | ICD-10-CM

## 2015-07-30 DIAGNOSIS — I10 Essential (primary) hypertension: Secondary | ICD-10-CM | POA: Diagnosis not present

## 2015-07-30 DIAGNOSIS — Z23 Encounter for immunization: Secondary | ICD-10-CM | POA: Diagnosis not present

## 2015-07-30 DIAGNOSIS — Z6828 Body mass index (BMI) 28.0-28.9, adult: Secondary | ICD-10-CM | POA: Diagnosis not present

## 2015-07-30 DIAGNOSIS — D0220 Carcinoma in situ of unspecified bronchus and lung: Secondary | ICD-10-CM | POA: Diagnosis not present

## 2015-08-07 MED ORDER — SODIUM CHLORIDE 0.9 % IJ SOLN
INTRAMUSCULAR | Status: AC
  Filled 2015-08-07: qty 60

## 2015-08-08 ENCOUNTER — Ambulatory Visit (HOSPITAL_COMMUNITY)
Admission: RE | Admit: 2015-08-08 | Discharge: 2015-08-08 | Disposition: A | Discharge status: Home / Self Care | Payer: Medicare Other | Source: Ambulatory Visit | Attending: Internal Medicine | Admitting: Internal Medicine

## 2015-08-08 ENCOUNTER — Encounter (HOSPITAL_COMMUNITY)
Admission: RE | Disposition: A | Discharge status: Home / Self Care | Payer: Self-pay | Source: Ambulatory Visit | Attending: Internal Medicine

## 2015-08-08 ENCOUNTER — Encounter (HOSPITAL_COMMUNITY): Payer: Self-pay | Admitting: *Deleted

## 2015-08-08 DIAGNOSIS — Z85118 Personal history of other malignant neoplasm of bronchus and lung: Secondary | ICD-10-CM | POA: Diagnosis not present

## 2015-08-08 DIAGNOSIS — D120 Benign neoplasm of cecum: Secondary | ICD-10-CM | POA: Diagnosis not present

## 2015-08-08 DIAGNOSIS — I10 Essential (primary) hypertension: Secondary | ICD-10-CM | POA: Insufficient documentation

## 2015-08-08 DIAGNOSIS — Z87891 Personal history of nicotine dependence: Secondary | ICD-10-CM | POA: Diagnosis not present

## 2015-08-08 DIAGNOSIS — Z1211 Encounter for screening for malignant neoplasm of colon: Secondary | ICD-10-CM | POA: Diagnosis not present

## 2015-08-08 DIAGNOSIS — Z79899 Other long term (current) drug therapy: Secondary | ICD-10-CM | POA: Insufficient documentation

## 2015-08-08 DIAGNOSIS — K219 Gastro-esophageal reflux disease without esophagitis: Secondary | ICD-10-CM | POA: Diagnosis not present

## 2015-08-08 DIAGNOSIS — D175 Benign lipomatous neoplasm of intra-abdominal organs: Secondary | ICD-10-CM | POA: Insufficient documentation

## 2015-08-08 DIAGNOSIS — K5730 Diverticulosis of large intestine without perforation or abscess without bleeding: Secondary | ICD-10-CM | POA: Diagnosis not present

## 2015-08-08 DIAGNOSIS — Z86010 Personal history of colonic polyps: Secondary | ICD-10-CM | POA: Insufficient documentation

## 2015-08-08 DIAGNOSIS — K648 Other hemorrhoids: Secondary | ICD-10-CM | POA: Insufficient documentation

## 2015-08-08 DIAGNOSIS — D122 Benign neoplasm of ascending colon: Secondary | ICD-10-CM | POA: Diagnosis not present

## 2015-08-08 DIAGNOSIS — Z8546 Personal history of malignant neoplasm of prostate: Secondary | ICD-10-CM | POA: Diagnosis not present

## 2015-08-08 DIAGNOSIS — D125 Benign neoplasm of sigmoid colon: Secondary | ICD-10-CM | POA: Diagnosis not present

## 2015-08-08 DIAGNOSIS — D124 Benign neoplasm of descending colon: Secondary | ICD-10-CM

## 2015-08-08 DIAGNOSIS — Z85840 Personal history of malignant neoplasm of eye: Secondary | ICD-10-CM | POA: Diagnosis not present

## 2015-08-08 DIAGNOSIS — D123 Benign neoplasm of transverse colon: Secondary | ICD-10-CM | POA: Insufficient documentation

## 2015-08-08 DIAGNOSIS — J449 Chronic obstructive pulmonary disease, unspecified: Secondary | ICD-10-CM | POA: Diagnosis not present

## 2015-08-08 HISTORY — PX: COLONOSCOPY: SHX5424

## 2015-08-08 SURGERY — COLONOSCOPY
Anesthesia: Moderate Sedation

## 2015-08-08 MED ORDER — MIDAZOLAM HCL 5 MG/5ML IJ SOLN
INTRAMUSCULAR | Status: DC | PRN
  Administered 2015-08-08: 1 mg via INTRAVENOUS
  Administered 2015-08-08: 2 mg via INTRAVENOUS
  Administered 2015-08-08: 1 mg via INTRAVENOUS
  Administered 2015-08-08: 2 mg via INTRAVENOUS
  Administered 2015-08-08: 1 mg via INTRAVENOUS

## 2015-08-08 MED ORDER — STERILE WATER FOR IRRIGATION IR SOLN
Status: DC | PRN
  Administered 2015-08-08: 2.5 mL

## 2015-08-08 MED ORDER — MEPERIDINE HCL 50 MG/ML IJ SOLN
INTRAMUSCULAR | Status: DC | PRN
  Administered 2015-08-08 (×2): 25 mg

## 2015-08-08 MED ORDER — MEPERIDINE HCL 50 MG/ML IJ SOLN
INTRAMUSCULAR | Status: AC
  Filled 2015-08-08: qty 1

## 2015-08-08 MED ORDER — MIDAZOLAM HCL 5 MG/5ML IJ SOLN
INTRAMUSCULAR | Status: AC
  Filled 2015-08-08: qty 10

## 2015-08-08 MED ORDER — SODIUM CHLORIDE 0.9 % IV SOLN
INTRAVENOUS | Status: DC | PRN
  Administered 2015-08-08: 1000 mL via INTRAVENOUS

## 2015-08-08 NOTE — H&P (Signed)
[name] Short is an 69 y.o. male.   Chief Complaint:  Patient is here for colonoscopy. HPI:   Patient is 69 year old Caucasian male with history of colonic polyps and is here for surveillance colonoscopy. Last exam was in November 2013 with removal of 6 polyps and they're all tubular adenomas. One polyp was over 20 mm.  Patient denies abdominal pain change in bowel habits or rectal bleeding.  Personal history significant for right ocular, lung and prostate cancer.  family history is negative for CRC but his brother has colonic polyps.  Past Medical History  Diagnosis Date  . Hypertension   . Shortness of breath     ONLY WITH EXERTION; HX OF LOBECTOMY FOR CANER  . COPD (chronic obstructive pulmonary disease) ([place])     FORMER SMOKER  . Pneumonia 2011  . GERD (gastroesophageal reflux disease)   . Arthritis     ESP IN HANDS  . Prostate cancer [facility])     prostate ca dx 11/11  . Lung cancer ([place])     lung ca dx 02/2010  . Cancer ([place])     OCCULAR RIGHT EYE--SURGERY AND CHEMO  PT IS LEGALLY BLIND IN RT EYE  . Erectile dysfunction   . Glaucoma     ONLY IN RIGHT EYE  . Cataract     LEFT EYE    Past Surgical History  Procedure Laterality Date  . Eye surgery      OCT 1999 DETACHED RETINA -RIGHT; MAY AND JUNE 2009 RIGHT EYE SURGERY FOR CANCER-LENS HAS BEEN REMOVED.  [name] Left upper lobectomy  03/2010    FOR CANCER  . Prostatectomy  DEC 2011    FOR CANCER  . Appendectomy  JUNE 2000  . Colonoscopy  08/06/2012    Procedure: COLONOSCOPY;  Surgeon: Rogene Houston, MD;  Location: [facility];  Service: Endoscopy;  Laterality: N/A;  955  . Penile prosthesis implant  08/10/2012    Procedure: PENILE PROTHESIS INFLATABLE;  Surgeon: Malka So, MD;  Location: [facility] ORS;  Service: Urology;  Laterality: N/A;  IMPLANT 3 PIECE PENILE PROSTHESIS     Family History  Problem Relation Age of Onset  . Colon polyps Brother   . Colon polyps Brother   . Colon polyps Brother   . Colon polyps Brother     Social History:  reports that he quit smoking about 6 years ago. He quit smokeless tobacco use about 29 years ago. His smokeless tobacco use included Chew. He reports that he does not drink alcohol or use illicit drugs.  Allergies: No Known Allergies  Medications Prior to Admission  Medication Sig Dispense Refill  . acetaminophen (TYLENOL) 650 MG CR tablet Take 650 mg by mouth every morning.     . brimonidine (ALPHAGAN) 0.15 % ophthalmic solution Place 1 drop into the right eye daily.     . chlorthalidone (HYGROTON) 25 MG tablet Take 12.5 mg by mouth daily.     . diphenhydrAMINE (BENADRYL) 25 MG tablet Take 25 mg by mouth every 6 (six) hours as needed.    [name] losartan (COZAAR) 100 MG tablet Take 100 mg by mouth daily.    [name] NIFEdipine (ADALAT CC) 90 MG 24 hr tablet Take 90 mg by mouth daily.    [name] omeprazole (PRILOSEC) 20 MG capsule Take 20 mg by mouth daily.    Manus Gunning BOWEL PREP SOLN Take 1 kit by mouth once. 1 Bottle 0    No results found for this or any previous  visit (from the past 48 hour(s)). No results found.  ROS  Blood pressure 120/68, pulse 71, temperature 98.1 F (36.7 C), temperature source Oral, resp. rate 18, height 5' 7.5" (1.715 m), weight 180 lb (81.647 kg), SpO2 100 %. Physical Exam  Constitutional: He appears well-developed and well-nourished.  HENT:  Mouth/Throat: Oropharynx is clear and moist.  Eyes: Conjunctivae are normal. No scleral icterus.  Patient is blind in right eye.  Neck: No thyromegaly present.  Cardiovascular: Normal rate, regular rhythm and normal heart sounds.   No murmur heard. Respiratory: Effort normal and breath sounds normal.  GI: Soft. He exhibits no distension and no mass. There is no tenderness.  Musculoskeletal: He exhibits no edema.  Lymphadenopathy:    He has no cervical adenopathy.  Neurological: He is alert.  Skin: Skin is warm and dry.     Assessment/Plan  History of multiple colonic adenomas. Surveillance colonoscopy.      Costas Sena U 08/08/2015, 11:41 AM

## 2015-08-08 NOTE — Op Note (Signed)
COLONOSCOPY PROCEDURE REPORT  PATIENT:  [name] Short  MR#:  [id number] Birthdate:  December 20, 1945, 69 y.o., male Endoscopist:  Dr. Rogene Houston, MD Referred By:  Dr. Asencion Noble, MD  Procedure Date: 08/08/2015  Procedure:   Colonoscopy with snare polypectomy and clip application  Indications: patient is 69 year old Caucasian male who had 6 tubular adenomas removed 3 years ago. He is returning for surveillance colonoscopy. He has no GI symptoms. Firstly history significant for 3 different malignancies and he remains in remission. Family history is negative for CRC.  Informed Consent:  The procedure and risks were reviewed with the patient and informed consent was obtained.  Medications:  Demerol 50 mg IV Versed 7 mg IV  Description of procedure:  After a digital rectal exam was performed, that colonoscope was advanced from the anus through the rectum and colon to the area of the cecum, ileocecal valve and appendiceal orifice. The cecum was deeply intubated. These structures were well-seen and photographed for the record. From the level of the cecum and ileocecal valve, the scope was slowly and cautiously withdrawn. The mucosal surfaces were carefully surveyed utilizing scope tip to flexion to facilitate fold flattening as needed. The scope was pulled down into the rectum where a thorough exam including retroflexion was performed.  Findings:   Prep excellent. 12 mm submucosal lipoma noted across from ileocecal valve. 2 small polyps or ablated via cold biopsy and submitted along with 2 polyps are removed from sigmoid colon as below. 12 mm sessile polyp at hepatic flexure. This polyp was snared piecemeal. 2 pieces and retrieved in one piece was loss. 10/18/1958 clips applied to approximate polypectomy site. 10 mm sessile polyp snared in 2 pieces from proximal transverse colon. 7 mm polyp hot snared from sigmoid colon. 5 mm polyp cold snared from distal sigmoid colon. Sigmoid colon polyps were  submitted along with a ascending colon polyps as above. Few small diverticula at sigmoid colon. Small hemorrhoids above the dentate line  Therapeutic/Diagnostic Maneuvers Performed:  See above  Complications:  none  EBL: None  Cecal Withdrawal Time:  45 minutes  Impression:  Examination performed to cecum. Four polyps were submitted together( two at ascending colon and 2two at sigmoid colon). 12 mm sessilepolyp hepatic flexure. Piecemeal polypectomy performed. Polypectomy complete. To 360 clips applied to polypectomy site. 10 mm sessile polyp at proximal transverse colon. Polyp hot snared into pieces. Polypectomy complete. Mild sigmoid colon diverticulosis. Small internal hemorrhoids.  Comment: Patient had 6 polyps altogether.  Recommendations:  Standard instructions given. No aspirin or NSAIDs for 10 days. I will contact patient with biopsy results and further recommendations.  [name] Short  08/08/2015 12:56 PM  CC: Dr. Asencion Noble, MD & Dr. Rayne Du ref. provider found

## 2015-08-08 NOTE — Discharge Instructions (Signed)
No aspirin or NSAIDs for 10 days. Resume usual medications and high fiber diet. No driving for 24 hours. Physician will call with biopsy results.  Colonoscopy, Care After Refer to this sheet in the next few weeks. These instructions provide you with information on caring for yourself after your procedure. Your health care provider may also give you more specific instructions. Your treatment has been planned according to current medical practices, but problems sometimes occur. Call your health care provider if you have any problems or questions after your procedure. WHAT TO EXPECT AFTER THE PROCEDURE  After your procedure, it is typical to have the following:  A small amount of blood in your stool.  Moderate amounts of gas and mild abdominal cramping or bloating. HOME CARE INSTRUCTIONS  Do not drive, operate machinery, or sign important documents for 24 hours.  You may shower and resume your regular physical activities, but move at a slower pace for the first 24 hours.  Take frequent rest periods for the first 24 hours.  Walk around or put a warm pack on your abdomen to help reduce abdominal cramping and bloating.  Drink enough fluids to keep your urine clear or pale yellow.  You may resume your normal diet as instructed by your health care provider. Avoid heavy or fried foods that are hard to digest.  Avoid drinking alcohol for 24 hours or as instructed by your health care provider.  Only take over-the-counter or prescription medicines as directed by your health care provider.  If a tissue sample (biopsy) was taken during your procedure:  Do not take aspirin or blood thinners for 7 days, or as instructed by your health care provider.  Do not drink alcohol for 7 days, or as instructed by your health care provider.  Eat soft foods for the first 24 hours. SEEK MEDICAL CARE IF: You have persistent spotting of blood in your stool 2-3 days after the procedure. SEEK IMMEDIATE MEDICAL  CARE IF:  You have more than a small spotting of blood in your stool.  You pass large blood clots in your stool.  Your abdomen is swollen (distended).  You have nausea or vomiting.  You have a fever.  You have increasing abdominal pain that is not relieved with medicine.   This information is not intended to replace advice given to you by your health care provider. Make sure you discuss any questions you have with your health care provider.   Colon Polyps Polyps are lumps of extra tissue growing inside the body. Polyps can grow in the large intestine (colon). Most colon polyps are noncancerous (benign). However, some colon polyps can become cancerous over time. Polyps that are larger than a pea may be harmful. To be safe, caregivers remove and test all polyps. CAUSES  Polyps form when mutations in the genes cause your cells to grow and divide even though no more tissue is needed. RISK FACTORS There are a number of risk factors that can increase your chances of getting colon polyps. They include:  Being older than 50 years.  Family history of colon polyps or colon cancer.  Long-term colon diseases, such as colitis or Crohn disease.  Being overweight.  Smoking.  Being inactive.  Drinking too much alcohol. SYMPTOMS  Most small polyps do not cause symptoms. If symptoms are present, they may include:  Blood in the stool. The stool may look dark red or black.  Constipation or diarrhea that lasts longer than 1 week. DIAGNOSIS People often do not  know they have polyps until their caregiver finds them during a regular checkup. Your caregiver can use 4 tests to check for polyps:  Digital rectal exam. The caregiver wears gloves and feels inside the rectum. This test would find polyps only in the rectum.  Barium enema. The caregiver puts a liquid called barium into your rectum before taking X-rays of your colon. Barium makes your colon look white. Polyps are dark, so they are easy  to see in the X-ray pictures.  Sigmoidoscopy. A thin, flexible tube (sigmoidoscope) is placed into your rectum. The sigmoidoscope has a light and tiny camera in it. The caregiver uses the sigmoidoscope to look at the last third of your colon.  Colonoscopy. This test is like sigmoidoscopy, but the caregiver looks at the entire colon. This is the most common method for finding and removing polyps. TREATMENT  Any polyps will be removed during a sigmoidoscopy or colonoscopy. The polyps are then tested for cancer. PREVENTION  To help lower your risk of getting more colon polyps:  Eat plenty of fruits and vegetables. Avoid eating fatty foods.  Do not smoke.  Avoid drinking alcohol.  Exercise every day.  Lose weight if recommended by your caregiver.  Eat plenty of calcium and folate. Foods that are rich in calcium include milk, cheese, and broccoli. Foods that are rich in folate include chickpeas, kidney beans, and spinach. HOME CARE INSTRUCTIONS Keep all follow-up appointments as directed by your caregiver. You may need periodic exams to check for polyps. SEEK MEDICAL CARE IF: You notice bleeding during a bowel movement.   This information is not intended to replace advice given to you by your health care provider. Make sure you discuss any questions you have with your health care provider.   High-Fiber Diet Fiber, also called dietary fiber, is a type of carbohydrate found in fruits, vegetables, whole grains, and beans. A high-fiber diet can have many health benefits. Your health care provider may recommend a high-fiber diet to help:  Prevent constipation. Fiber can make your bowel movements more regular.  Lower your cholesterol.  Relieve hemorrhoids, uncomplicated diverticulosis, or irritable bowel syndrome.  Prevent overeating as part of a weight-loss plan.  Prevent heart disease, type 2 diabetes, and certain cancers. WHAT IS MY PLAN? The recommended daily intake of fiber  includes:  38 grams for men under age 12.  25 grams for men over age 34.  31 grams for women under age 66.  8 grams for women over age 64. You can get the recommended daily intake of dietary fiber by eating a variety of fruits, vegetables, grains, and beans. Your health care provider may also recommend a fiber supplement if it is not possible to get enough fiber through your diet. WHAT DO I NEED TO KNOW ABOUT A HIGH-FIBER DIET?  Fiber supplements have not been widely studied for their effectiveness, so it is better to get fiber through food sources.  Always check the fiber content on thenutrition facts label of any prepackaged food. Look for foods that contain at least 5 grams of fiber per serving.  Ask your dietitian if you have questions about specific foods that are related to your condition, especially if those foods are not listed in the following section.  Increase your daily fiber consumption gradually. Increasing your intake of dietary fiber too quickly may cause bloating, cramping, or gas.  Drink plenty of water. Water helps you to digest fiber. WHAT FOODS CAN I EAT? Grains Whole-grain breads. Multigrain cereal. Oats  and oatmeal. Brown rice. Barley. Bulgur wheat. [place]. Bran muffins. Popcorn. Rye wafer crackers. Vegetables Sweet potatoes. Spinach. Kale. Artichokes. Cabbage. Broccoli. Green peas. Carrots. Squash. Fruits Berries. Pears. Apples. Oranges. Avocados. Prunes and raisins. Dried figs. Meats and Other Protein Sources Navy, kidney, pinto, and soy beans. Split peas. Lentils. Nuts and seeds. Dairy Fiber-fortified yogurt. Beverages Fiber-fortified soy milk. Fiber-fortified orange juice. Other Fiber bars. The items listed above may not be a complete list of recommended foods or beverages. Contact your dietitian for more options. WHAT FOODS ARE NOT RECOMMENDED? Grains White bread. Pasta made with refined flour. White rice. Vegetables Fried potatoes. Canned  vegetables. Well-cooked vegetables.  Fruits Fruit juice. Cooked, strained fruit. Meats and Other Protein Sources Fatty cuts of meat. Fried Sales executive or fried fish. Dairy Milk. Yogurt. Cream cheese. Sour cream. Beverages Soft drinks. Other Cakes and pastries. Butter and oils. The items listed above may not be a complete list of foods and beverages to avoid. Contact your dietitian for more information. WHAT ARE SOME TIPS FOR INCLUDING HIGH-FIBER FOODS IN MY DIET?  Eat a wide variety of high-fiber foods.  Make sure that half of all grains consumed each day are whole grains.  Replace breads and cereals made from refined flour or white flour with whole-grain breads and cereals.  Replace white rice with brown rice, bulgur wheat, or millet.  Start the day with a breakfast that is high in fiber, such as a cereal that contains at least 5 grams of fiber per serving.  Use beans in place of meat in soups, salads, or pasta.  Eat high-fiber snacks, such as berries, raw vegetables, nuts, or popcorn.   This information is not intended to replace advice given to you by your health care provider. Make sure you discuss any questions you have with your health care provider.

## 2015-08-14 ENCOUNTER — Encounter (HOSPITAL_COMMUNITY): Payer: Self-pay | Admitting: Internal Medicine

## 2015-08-15 DIAGNOSIS — H25812 Combined forms of age-related cataract, left eye: Secondary | ICD-10-CM | POA: Diagnosis not present

## 2015-08-15 DIAGNOSIS — H524 Presbyopia: Secondary | ICD-10-CM | POA: Diagnosis not present

## 2015-08-22 DIAGNOSIS — M4716 Other spondylosis with myelopathy, lumbar region: Secondary | ICD-10-CM | POA: Diagnosis not present

## 2015-08-22 DIAGNOSIS — M4806 Spinal stenosis, lumbar region: Secondary | ICD-10-CM | POA: Diagnosis not present

## 2015-08-22 DIAGNOSIS — M4317 Spondylolisthesis, lumbosacral region: Secondary | ICD-10-CM | POA: Diagnosis not present

## 2015-09-21 DIAGNOSIS — Z8546 Personal history of malignant neoplasm of prostate: Secondary | ICD-10-CM | POA: Diagnosis not present

## 2015-09-28 DIAGNOSIS — Z23 Encounter for immunization: Secondary | ICD-10-CM | POA: Diagnosis not present

## 2015-10-01 DIAGNOSIS — Z01818 Encounter for other preprocedural examination: Secondary | ICD-10-CM | POA: Diagnosis not present

## 2015-10-04 DIAGNOSIS — M4716 Other spondylosis with myelopathy, lumbar region: Secondary | ICD-10-CM | POA: Diagnosis not present

## 2015-10-04 DIAGNOSIS — M4316 Spondylolisthesis, lumbar region: Secondary | ICD-10-CM | POA: Diagnosis not present

## 2015-10-04 DIAGNOSIS — M4806 Spinal stenosis, lumbar region: Secondary | ICD-10-CM | POA: Diagnosis not present

## 2015-10-04 DIAGNOSIS — M47897 Other spondylosis, lumbosacral region: Secondary | ICD-10-CM | POA: Diagnosis not present

## 2015-10-04 DIAGNOSIS — J9811 Atelectasis: Secondary | ICD-10-CM | POA: Diagnosis not present

## 2015-10-04 DIAGNOSIS — Z85118 Personal history of other malignant neoplasm of bronchus and lung: Secondary | ICD-10-CM | POA: Diagnosis not present

## 2015-10-04 DIAGNOSIS — K219 Gastro-esophageal reflux disease without esophagitis: Secondary | ICD-10-CM | POA: Diagnosis not present

## 2015-10-04 DIAGNOSIS — I10 Essential (primary) hypertension: Secondary | ICD-10-CM | POA: Diagnosis not present

## 2015-10-04 DIAGNOSIS — Z79899 Other long term (current) drug therapy: Secondary | ICD-10-CM | POA: Diagnosis not present

## 2015-10-04 DIAGNOSIS — R0902 Hypoxemia: Secondary | ICD-10-CM | POA: Diagnosis not present

## 2015-10-04 DIAGNOSIS — M47896 Other spondylosis, lumbar region: Secondary | ICD-10-CM | POA: Diagnosis not present

## 2015-10-04 DIAGNOSIS — Z809 Family history of malignant neoplasm, unspecified: Secondary | ICD-10-CM | POA: Diagnosis not present

## 2015-10-04 DIAGNOSIS — Z8546 Personal history of malignant neoplasm of prostate: Secondary | ICD-10-CM | POA: Diagnosis not present

## 2015-10-04 DIAGNOSIS — M4326 Fusion of spine, lumbar region: Secondary | ICD-10-CM | POA: Diagnosis not present

## 2015-10-04 DIAGNOSIS — M4317 Spondylolisthesis, lumbosacral region: Secondary | ICD-10-CM | POA: Diagnosis not present

## 2015-10-04 DIAGNOSIS — M5126 Other intervertebral disc displacement, lumbar region: Secondary | ICD-10-CM | POA: Diagnosis not present

## 2015-10-09 DIAGNOSIS — Z9981 Dependence on supplemental oxygen: Secondary | ICD-10-CM | POA: Diagnosis not present

## 2015-10-09 DIAGNOSIS — Z8546 Personal history of malignant neoplasm of prostate: Secondary | ICD-10-CM | POA: Diagnosis not present

## 2015-10-09 DIAGNOSIS — K219 Gastro-esophageal reflux disease without esophagitis: Secondary | ICD-10-CM | POA: Diagnosis not present

## 2015-10-09 DIAGNOSIS — R0902 Hypoxemia: Secondary | ICD-10-CM | POA: Diagnosis not present

## 2015-10-09 DIAGNOSIS — Z85118 Personal history of other malignant neoplasm of bronchus and lung: Secondary | ICD-10-CM | POA: Diagnosis not present

## 2015-10-09 DIAGNOSIS — H5411 Blindness, right eye, low vision left eye: Secondary | ICD-10-CM | POA: Diagnosis not present

## 2015-10-09 DIAGNOSIS — Z85840 Personal history of malignant neoplasm of eye: Secondary | ICD-10-CM | POA: Diagnosis not present

## 2015-10-09 DIAGNOSIS — Z4789 Encounter for other orthopedic aftercare: Secondary | ICD-10-CM | POA: Diagnosis not present

## 2015-10-09 DIAGNOSIS — I10 Essential (primary) hypertension: Secondary | ICD-10-CM | POA: Diagnosis not present

## 2015-10-11 DIAGNOSIS — M4716 Other spondylosis with myelopathy, lumbar region: Secondary | ICD-10-CM | POA: Diagnosis not present

## 2015-10-11 DIAGNOSIS — Z981 Arthrodesis status: Secondary | ICD-10-CM | POA: Diagnosis not present

## 2015-10-11 DIAGNOSIS — M4326 Fusion of spine, lumbar region: Secondary | ICD-10-CM | POA: Diagnosis not present

## 2015-10-11 DIAGNOSIS — I700 Atherosclerosis of aorta: Secondary | ICD-10-CM | POA: Diagnosis not present

## 2015-10-12 ENCOUNTER — Other Ambulatory Visit (HOSPITAL_COMMUNITY): Payer: Self-pay | Admitting: Respiratory Therapy

## 2015-10-12 DIAGNOSIS — I10 Essential (primary) hypertension: Secondary | ICD-10-CM | POA: Diagnosis not present

## 2015-10-12 DIAGNOSIS — K219 Gastro-esophageal reflux disease without esophagitis: Secondary | ICD-10-CM | POA: Diagnosis not present

## 2015-10-12 DIAGNOSIS — G4736 Sleep related hypoventilation in conditions classified elsewhere: Secondary | ICD-10-CM

## 2015-10-12 DIAGNOSIS — Z8546 Personal history of malignant neoplasm of prostate: Secondary | ICD-10-CM | POA: Diagnosis not present

## 2015-10-12 DIAGNOSIS — I270 Primary pulmonary hypertension: Principal | ICD-10-CM

## 2015-10-12 DIAGNOSIS — Z9981 Dependence on supplemental oxygen: Secondary | ICD-10-CM | POA: Diagnosis not present

## 2015-10-12 DIAGNOSIS — H5411 Blindness, right eye, low vision left eye: Secondary | ICD-10-CM | POA: Diagnosis not present

## 2015-10-12 DIAGNOSIS — Z4789 Encounter for other orthopedic aftercare: Secondary | ICD-10-CM | POA: Diagnosis not present

## 2015-10-16 DIAGNOSIS — Z8546 Personal history of malignant neoplasm of prostate: Secondary | ICD-10-CM | POA: Diagnosis not present

## 2015-10-16 DIAGNOSIS — I10 Essential (primary) hypertension: Secondary | ICD-10-CM | POA: Diagnosis not present

## 2015-10-16 DIAGNOSIS — H5411 Blindness, right eye, low vision left eye: Secondary | ICD-10-CM | POA: Diagnosis not present

## 2015-10-16 DIAGNOSIS — K219 Gastro-esophageal reflux disease without esophagitis: Secondary | ICD-10-CM | POA: Diagnosis not present

## 2015-10-16 DIAGNOSIS — Z9981 Dependence on supplemental oxygen: Secondary | ICD-10-CM | POA: Diagnosis not present

## 2015-10-16 DIAGNOSIS — Z4789 Encounter for other orthopedic aftercare: Secondary | ICD-10-CM | POA: Diagnosis not present

## 2015-10-18 DIAGNOSIS — H5411 Blindness, right eye, low vision left eye: Secondary | ICD-10-CM | POA: Diagnosis not present

## 2015-10-18 DIAGNOSIS — I10 Essential (primary) hypertension: Secondary | ICD-10-CM | POA: Diagnosis not present

## 2015-10-18 DIAGNOSIS — Z8546 Personal history of malignant neoplasm of prostate: Secondary | ICD-10-CM | POA: Diagnosis not present

## 2015-10-18 DIAGNOSIS — Z9981 Dependence on supplemental oxygen: Secondary | ICD-10-CM | POA: Diagnosis not present

## 2015-10-18 DIAGNOSIS — Z4789 Encounter for other orthopedic aftercare: Secondary | ICD-10-CM | POA: Diagnosis not present

## 2015-10-18 DIAGNOSIS — K219 Gastro-esophageal reflux disease without esophagitis: Secondary | ICD-10-CM | POA: Diagnosis not present

## 2015-10-20 ENCOUNTER — Ambulatory Visit (HOSPITAL_BASED_OUTPATIENT_CLINIC_OR_DEPARTMENT_OTHER): Payer: Medicare Other | Attending: Internal Medicine | Admitting: Sleep Medicine

## 2015-10-20 VITALS — Ht 67.0 in | Wt 182.0 lb

## 2015-10-20 DIAGNOSIS — R0683 Snoring: Secondary | ICD-10-CM

## 2015-10-20 DIAGNOSIS — G4736 Sleep related hypoventilation in conditions classified elsewhere: Secondary | ICD-10-CM | POA: Diagnosis not present

## 2015-10-20 DIAGNOSIS — Z79899 Other long term (current) drug therapy: Secondary | ICD-10-CM | POA: Diagnosis not present

## 2015-10-20 DIAGNOSIS — I10 Essential (primary) hypertension: Secondary | ICD-10-CM | POA: Insufficient documentation

## 2015-10-20 DIAGNOSIS — G4733 Obstructive sleep apnea (adult) (pediatric): Secondary | ICD-10-CM | POA: Insufficient documentation

## 2015-10-20 DIAGNOSIS — R0902 Hypoxemia: Secondary | ICD-10-CM

## 2015-10-20 DIAGNOSIS — I270 Primary pulmonary hypertension: Secondary | ICD-10-CM | POA: Insufficient documentation

## 2015-10-23 DIAGNOSIS — H5411 Blindness, right eye, low vision left eye: Secondary | ICD-10-CM | POA: Diagnosis not present

## 2015-10-23 DIAGNOSIS — Z8546 Personal history of malignant neoplasm of prostate: Secondary | ICD-10-CM | POA: Diagnosis not present

## 2015-10-23 DIAGNOSIS — I10 Essential (primary) hypertension: Secondary | ICD-10-CM | POA: Diagnosis not present

## 2015-10-23 DIAGNOSIS — Z9981 Dependence on supplemental oxygen: Secondary | ICD-10-CM | POA: Diagnosis not present

## 2015-10-23 DIAGNOSIS — Z4789 Encounter for other orthopedic aftercare: Secondary | ICD-10-CM | POA: Diagnosis not present

## 2015-10-23 DIAGNOSIS — K219 Gastro-esophageal reflux disease without esophagitis: Secondary | ICD-10-CM | POA: Diagnosis not present

## 2015-10-26 DIAGNOSIS — I10 Essential (primary) hypertension: Secondary | ICD-10-CM | POA: Diagnosis not present

## 2015-10-26 DIAGNOSIS — Z9981 Dependence on supplemental oxygen: Secondary | ICD-10-CM | POA: Diagnosis not present

## 2015-10-26 DIAGNOSIS — K219 Gastro-esophageal reflux disease without esophagitis: Secondary | ICD-10-CM | POA: Diagnosis not present

## 2015-10-26 DIAGNOSIS — Z4789 Encounter for other orthopedic aftercare: Secondary | ICD-10-CM | POA: Diagnosis not present

## 2015-10-26 DIAGNOSIS — H5411 Blindness, right eye, low vision left eye: Secondary | ICD-10-CM | POA: Diagnosis not present

## 2015-10-26 DIAGNOSIS — Z8546 Personal history of malignant neoplasm of prostate: Secondary | ICD-10-CM | POA: Diagnosis not present

## 2015-11-08 NOTE — Sleep Study (Signed)
Garretson A. Merlene Laughter, MD     [email or web address]             NOCTURNAL POLYSOMNOGRAPHY   LOCATION: [facility]   Patient Name: [name] Short, [name] Short Date: 10/20/2015 Gender: Male D.O.B: 08/31/1946 Age (years): 69 Referring Provider: Asencion Noble Height (inches): 28 Interpreting Physician: Phillips Odor MD, ABSM Weight (lbs): 182 RPSGT: Peak, Robert BMI: 29 MRN: [id number] Neck Size: 16.00 CLINICAL INFORMATION Sleep Study Type: Split Night CPAP Indication for sleep study: Hypertension Epworth Sleepiness Score: SLEEP STUDY TECHNIQUE As per the AASM Manual for the Scoring of Sleep and Associated Events v2.3 (April 2016) with a hypopnea requiring 4% desaturations. The channels recorded and monitored were frontal, central and occipital EEG, electrooculogram (EOG), submentalis EMG (chin), nasal and oral airflow, thoracic and abdominal wall motion, anterior tibialis EMG, snore microphone, electrocardiogram, and pulse oximetry. Continuous positive airway pressure (CPAP) was initiated when the patient met split night criteria and was titrated according to treat sleep-disordered breathing. MEDICATIONS Medications taken by the patient : N/A Medications administered by patient during sleep study : No sleep medicine administered. Current Outpatient Prescriptions on File Prior to Visit  Medication Sig Dispense Refill  . acetaminophen (TYLENOL) 650 MG CR tablet Take 650 mg by mouth every morning.     . brimonidine (ALPHAGAN) 0.15 % ophthalmic solution Place 1 drop into the right eye daily.     . chlorthalidone (HYGROTON) 25 MG tablet Take 12.5 mg by mouth daily.     . diphenhydrAMINE (BENADRYL) 25 MG tablet Take 25 mg by mouth every 6 (six) hours as needed.    [name] losartan (COZAAR) 100 MG tablet Take 100 mg by mouth daily.    [name] NIFEdipine (ADALAT CC) 90 MG 24 hr tablet Take 90 mg by mouth daily.    [name] omeprazole (PRILOSEC) 20 MG capsule Take 20 mg by mouth daily.     No current  facility-administered medications on file prior to visit.    RESPIRATORY PARAMETERS Diagnostic Total AHI (/hr): 41.8 RDI (/hr): 45.0 OA Index (/hr): 13.4 CA Index (/hr): 10.3 REM AHI (/hr): 0.0 NREM AHI (/hr): 42.7 Supine AHI (/hr): 78.4 Non-supine AHI (/hr): 6.23 Min O2 Sat (%): 88.00 Mean O2 (%): 93.11 Time below 88% (min): 0.2     Titration Optimal Pressure (cm):   AHI at Optimal Pressure (/hr): N/A Min O2 at Optimal Pressure (%): 88.00 Supine % at Optimal (%): N/A Sleep % at Optimal (%): N/A       Although an optimal pressure was not reached, the patient did best on a pressure of 7 which was conducted for close to 2 hours. The AHI was 1.7 and RDI 2.3 with the minimum desaturation 92% and the mean saturation 95%. This CPAP titration was complicated by a high mask leak especially at higher pressures.   SLEEP ARCHITECTURE The recording time for the entire night was 455.4 minutes. During a baseline period of 176.5 minutes, the patient slept for 152.0 minutes in REM and nonREM, yielding a sleep efficiency of 86.1%. Sleep onset after lights out was 3.9 minutes with a REM latency of 147.5 minutes. The patient spent 20.39% of the night in stage N1 sleep, 77.63% in stage N2 sleep, 0.00% in stage N3 and 1.97% in REM. During the titration period of 276.4 minutes, the patient slept for 243.0 minutes in REM and nonREM, yielding a sleep efficiency of 87.9%. Sleep onset after CPAP initiation was 5.9 minutes with a REM latency of 91.5 minutes. The patient spent 17.69% of  the night in stage N1 sleep, 63.17% in stage N2 sleep, 0.00% in stage N3 and 19.14% in REM. CARDIAC DATA The 2 lead EKG demonstrated sinus rhythm. The mean heart rate was 63.81 beats per minute. Other EKG findings include: None. LEG MOVEMENT DATA The total Periodic Limb Movements of Sleep (PLMS) were 429. The PLMS index was 65.16.     IMPRESSIONS - Severe obstructive sleep apnea occurred during the diagnostic portion of the study  (AHI = 41.8/hour). An optimal PAP pressure could not be selected for this patient based on the available study data. However, the patient did well on a pressure of 7. The study ,unfortunately, was complicated by high leak which may explain the lack of optimal pressure. I recommend auto Pap 7-12.  - Severe periodic limb movements of sleep occurred during the study.   Delano Metz, MD Diplomate, American Board of Sleep Medicine.

## 2016-01-25 ENCOUNTER — Ambulatory Visit (INDEPENDENT_AMBULATORY_CARE_PROVIDER_SITE_OTHER): Payer: Medicare Other | Admitting: Urology

## 2016-01-25 DIAGNOSIS — Z8546 Personal history of malignant neoplasm of prostate: Secondary | ICD-10-CM | POA: Diagnosis not present

## 2016-01-25 DIAGNOSIS — N50811 Right testicular pain: Secondary | ICD-10-CM

## 2016-01-25 DIAGNOSIS — N5201 Erectile dysfunction due to arterial insufficiency: Secondary | ICD-10-CM | POA: Diagnosis not present

## 2016-01-25 DIAGNOSIS — N390 Urinary tract infection, site not specified: Secondary | ICD-10-CM

## 2016-01-30 ENCOUNTER — Encounter (HOSPITAL_COMMUNITY): Payer: Self-pay

## 2016-01-30 ENCOUNTER — Other Ambulatory Visit (HOSPITAL_BASED_OUTPATIENT_CLINIC_OR_DEPARTMENT_OTHER): Payer: Medicare Other

## 2016-01-30 ENCOUNTER — Other Ambulatory Visit: Payer: PRIVATE HEALTH INSURANCE

## 2016-01-30 ENCOUNTER — Ambulatory Visit (HOSPITAL_COMMUNITY)
Admission: RE | Admit: 2016-01-30 | Discharge: 2016-01-30 | Disposition: A | Discharge status: Home / Self Care | Payer: Medicare Other | Source: Ambulatory Visit | Attending: Internal Medicine | Admitting: Internal Medicine

## 2016-01-30 DIAGNOSIS — Z902 Acquired absence of lung [part of]: Secondary | ICD-10-CM | POA: Diagnosis not present

## 2016-01-30 DIAGNOSIS — I7090 Unspecified atherosclerosis: Secondary | ICD-10-CM | POA: Insufficient documentation

## 2016-01-30 DIAGNOSIS — C3412 Malignant neoplasm of upper lobe, left bronchus or lung: Secondary | ICD-10-CM | POA: Diagnosis not present

## 2016-01-30 DIAGNOSIS — R911 Solitary pulmonary nodule: Secondary | ICD-10-CM | POA: Diagnosis not present

## 2016-01-30 DIAGNOSIS — I2510 Atherosclerotic heart disease of native coronary artery without angina pectoris: Secondary | ICD-10-CM | POA: Diagnosis not present

## 2016-01-30 LAB — CBC WITH DIFFERENTIAL/PLATELET
BASO%: 0.7 % (ref 0.0–2.0)
BASOS ABS: 0.1 10*3/uL (ref 0.0–0.1)
EOS%: 2.2 % (ref 0.0–7.0)
Eosinophils Absolute: 0.2 10*3/uL (ref 0.0–0.5)
HEMATOCRIT: 38 % — AB (ref 38.4–49.9)
HGB: 12.9 g/dL — ABNORMAL LOW (ref 13.0–17.1)
LYMPH%: 30.6 % (ref 14.0–49.0)
MCH: 30.8 pg (ref 27.2–33.4)
MCHC: 34 g/dL (ref 32.0–36.0)
MCV: 90.8 fL (ref 79.3–98.0)
MONO#: 0.6 10*3/uL (ref 0.1–0.9)
MONO%: 7.9 % (ref 0.0–14.0)
NEUT#: 4.3 10*3/uL (ref 1.5–6.5)
NEUT%: 58.6 % (ref 39.0–75.0)
Platelets: 336 10*3/uL (ref 140–400)
RBC: 4.19 10*6/uL — AB (ref 4.20–5.82)
RDW: 13.8 % (ref 11.0–14.6)
WBC: 7.3 10*3/uL (ref 4.0–10.3)
lymph#: 2.2 10*3/uL (ref 0.9–3.3)

## 2016-01-30 LAB — COMPREHENSIVE METABOLIC PANEL
ALT: 21 U/L (ref 0–55)
AST: 17 U/L (ref 5–34)
Albumin: 4.2 g/dL (ref 3.5–5.0)
Alkaline Phosphatase: 75 U/L (ref 40–150)
Anion Gap: 9 mEq/L (ref 3–11)
BUN: 32.7 mg/dL — AB (ref 7.0–26.0)
CALCIUM: 9.1 mg/dL (ref 8.4–10.4)
CHLORIDE: 108 meq/L (ref 98–109)
CO2: 22 meq/L (ref 22–29)
Creatinine: 1.4 mg/dL — ABNORMAL HIGH (ref 0.7–1.3)
EGFR: 50 mL/min/{1.73_m2} — ABNORMAL LOW (ref >90)
Glucose: 91 mg/dl (ref 70–140)
POTASSIUM: 4 meq/L (ref 3.5–5.1)
SODIUM: 138 meq/L (ref 136–145)
Total Bilirubin: 0.42 mg/dL (ref 0.20–1.20)
Total Protein: 7.7 g/dL (ref 6.4–8.3)

## 2016-01-30 MED ORDER — IOPAMIDOL (ISOVUE-300) INJECTION 61%
75.0000 mL | Freq: Once | INTRAVENOUS | Status: AC | PRN
  Administered 2016-01-30: 75 mL via INTRAVENOUS

## 2016-02-06 ENCOUNTER — Encounter: Payer: Self-pay | Admitting: Internal Medicine

## 2016-02-06 ENCOUNTER — Ambulatory Visit (HOSPITAL_BASED_OUTPATIENT_CLINIC_OR_DEPARTMENT_OTHER): Payer: Medicare Other | Admitting: Internal Medicine

## 2016-02-06 VITALS — BP 118/68 | HR 77 | Temp 98.0°F | Resp 18 | Ht 67.0 in | Wt 185.6 lb

## 2016-02-06 DIAGNOSIS — Z85118 Personal history of other malignant neoplasm of bronchus and lung: Secondary | ICD-10-CM

## 2016-02-06 DIAGNOSIS — C3412 Malignant neoplasm of upper lobe, left bronchus or lung: Secondary | ICD-10-CM

## 2016-02-06 NOTE — Progress Notes (Signed)
[facility] Telephone:([phone]) [phone]   Fax:(336) [place] [address] [place] [address]  PRINCIPAL DIAGNOSIS: Stage 1A (T1b N0 M0) non-small cell lung cancer, squamous cell carcinoma diagnosed in June, 2011.   PRIOR THERAPY: Status post left upper lobectomy with lymph node dissection under the care of Dr. Servando Snare on March 20, 2010.   CURRENT THERAPY: Observation.  INTERVAL HISTORY: [name] Short 70 y.o. male returns to the clinic today for annual follow up visit. The patient has been observation for the last 6 years. He is feeling fine today with no specific complaints. He had back surgery recently. He has no fever or chills, no nausea or vomiting. He denied having any significant chest pain, shortness of breath except with exertion, cough or hemoptysis. He has no weight loss or night sweats. The patient had repeat CT scan of the chest performed recently and he is here for evaluation and discussion of his scan results.  MEDICAL HISTORY: Past Medical History  Diagnosis Date  . Hypertension   . Shortness of breath     ONLY WITH EXERTION; HX OF LOBECTOMY FOR CANER  . COPD (chronic obstructive pulmonary disease) ([place])     FORMER SMOKER  . Pneumonia 2011  . GERD (gastroesophageal reflux disease)   . Arthritis     ESP IN HANDS  . Prostate cancer [facility])     prostate ca dx 11/11  . Lung cancer ([place])     lung ca dx 02/2010  . Cancer ([place])     OCCULAR RIGHT EYE--SURGERY AND CHEMO  PT IS LEGALLY BLIND IN RT EYE  . Erectile dysfunction   . Glaucoma     ONLY IN RIGHT EYE  . Cataract     LEFT EYE    ALLERGIES:  has No Known Allergies.  MEDICATIONS:  Current Outpatient Prescriptions  Medication Sig Dispense Refill  . acetaminophen (TYLENOL) 650 MG CR tablet Take 650 mg by mouth every morning.     . brimonidine (ALPHAGAN) 0.15 % ophthalmic solution Place 1 drop into the right eye daily.     . chlorthalidone  (HYGROTON) 25 MG tablet Take 12.5 mg by mouth daily.     . diphenhydrAMINE (BENADRYL) 25 MG tablet Take 25 mg by mouth every 6 (six) hours as needed.    [name] losartan (COZAAR) 100 MG tablet Take 100 mg by mouth daily.    [name] NIFEdipine (ADALAT CC) 90 MG 24 hr tablet Take 90 mg by mouth daily.    [name] omeprazole (PRILOSEC) 20 MG capsule Take 20 mg by mouth daily.     No current facility-administered medications for this visit.    SURGICAL HISTORY:  Past Surgical History  Procedure Laterality Date  . Eye surgery      OCT 1999 DETACHED RETINA -RIGHT; MAY AND JUNE 2009 RIGHT EYE SURGERY FOR CANCER-LENS HAS BEEN REMOVED.  [name] Left upper lobectomy  03/2010    FOR CANCER  . Prostatectomy  DEC 2011    FOR CANCER  . Appendectomy  JUNE 2000  . Colonoscopy  08/06/2012    Procedure: COLONOSCOPY;  Surgeon: Rogene Houston, MD;  [facility];  Service: Endoscopy;  Laterality: N/A;  955  . Penile prosthesis implant  08/10/2012    Procedure: PENILE PROTHESIS INFLATABLE;  Surgeon: Malka So, MD;  [facility];  Service: Urology;  Laterality: N/A;  IMPLANT 3 PIECE PENILE PROSTHESIS   .  Colonoscopy N/A 08/08/2015    Procedure: COLONOSCOPY;  Surgeon: Rogene Houston, MD;  [facility];  Service: Endoscopy;  Laterality: N/A;  1200    REVIEW OF SYSTEMS:  A comprehensive review of systems was negative.   PHYSICAL EXAMINATION: General appearance: alert, cooperative and no distress Head: Normocephalic, without obvious abnormality, atraumatic Neck: no adenopathy Lymph nodes: Cervical, supraclavicular, and axillary nodes normal. Resp: clear to auscultation bilaterally Cardio: regular rate and rhythm, S1, S2 normal, no murmur, click, rub or gallop GI: soft, non-tender; bowel sounds normal; no masses,  no organomegaly Extremities: extremities normal, atraumatic, no cyanosis or edema  ECOG PERFORMANCE STATUS: 0 - Asymptomatic  Blood pressure 118/68, pulse 77, temperature 98 F (36.7 C),  temperature source Oral, resp. rate 18, height '5\' 7"'$  (1.702 m), weight 185 lb 9.6 oz (84.188 kg), SpO2 98 %.  LABORATORY DATA: Lab Results  Component Value Date   WBC 7.3 01/30/2016   HGB 12.9* 01/30/2016   HCT 38.0* 01/30/2016   MCV 90.8 01/30/2016   PLT 336 01/30/2016      Chemistry      Component Value Date/Time   NA 138 01/30/2016 1144   NA 137 08/05/2012 1005   NA 142 04/19/2012 0835   K 4.0 01/30/2016 1144   K 3.9 08/05/2012 1005   K 4.2 04/19/2012 0835   CL 108* 02/01/2013 0758   CL 101 08/05/2012 1005   CL 106 04/19/2012 0835   CO2 22 01/30/2016 1144   CO2 23 08/05/2012 1005   CO2 24 04/19/2012 0835   BUN 32.7* 01/30/2016 1144   BUN 21 08/05/2012 1005   BUN 30* 04/19/2012 0835   CREATININE 1.4* 01/30/2016 1144   CREATININE 0.93 08/05/2012 1005   CREATININE 1.4* 04/19/2012 0835      Component Value Date/Time   CALCIUM 9.1 01/30/2016 1144   CALCIUM 9.5 08/05/2012 1005   CALCIUM 9.1 04/19/2012 0835   ALKPHOS 75 01/30/2016 1144   ALKPHOS 80 04/19/2012 0835   ALKPHOS 87 07/19/2010 0813   AST 17 01/30/2016 1144   AST 26 04/19/2012 0835   AST 23 07/19/2010 0813   ALT 21 01/30/2016 1144   ALT 30 04/19/2012 0835   ALT 23 07/19/2010 0813   BILITOT 0.42 01/30/2016 1144   BILITOT 0.60 04/19/2012 0835   BILITOT 0.3 07/19/2010 0813       RADIOGRAPHIC STUDIES: Ct Chest W Contrast  01/30/2016  CLINICAL DATA:  Lung cancer diagnosed 6/11. Prostate cancer 11/11. Ocular malignancy in 2009. New diagnosis of right renal infection. Left upper lobe primary. Staging. EXAM: CT CHEST WITH CONTRAST TECHNIQUE: Multidetector CT imaging of the chest was performed during intravenous contrast administration. CONTRAST:  107m ISOVUE-300 IOPAMIDOL (ISOVUE-300) INJECTION 61% COMPARISON:  Plain films 10/07/2015.  Most recent CT 01/30/2015. FINDINGS: Mediastinum/Nodes: No supraclavicular adenopathy. Aortic and branch vessel atherosclerosis. Mild cardiomegaly. Multivessel coronary artery  atherosclerosis. No central pulmonary embolism, on this non-dedicated study. 9 mm Lower left mediastinal node is similar to on the prior exam (likely reactive), including on image 57/series 2. Upper normal size. Otherwise, no mediastinal or hilar adenopathy. Lungs/Pleura: No pleural fluid. Left-sided focal pleural thickening is unchanged, including on image 62/series. Mild to moderate centrilobular emphysema. Left upper lobectomy. Airway otherwise unremarkable. At 2 mm right apical pulmonary nodule on image 22/series 5 is likely similar but more apparent today secondary to thinner slice collimation. Volume loss in the left hemithorax. Upper abdomen: Normal imaged portions of the liver, spleen, stomach, pancreas, gallbladder, biliary tract, adrenal  glands, kidneys. Advanced abdominal aortic atherosclerosis. Musculoskeletal: Remote right rib trauma. Postsurgical or posttraumatic defects involving left-sided ribs. IMPRESSION: 1. Status post left upper lobectomy, without recurrent or metastatic disease. 2.  Atherosclerosis, including within the coronary arteries. Electronically Signed   By: Abigail Miyamoto M.D.   On: 01/30/2016 16:01   ASSESSMENT AND PLAN: this is a very pleasant 70 years old white male with history of stage IA non-small cell lung cancer status post left upper lobectomy with lymph node dissection and has been observation since July 2011 with no evidence for disease progression. The recent CT scan of the chest showed no evidence for disease recurrence. I discussed the scan results with the patient today. He comes option of follow-up visit by his primary care physician versus referral to the survivorship clinic at the [facility]. The patient prefers to continue his routine visit and evaluation by the primary care physician at this point. I will see him on as-needed basis if there is any concerning findings.  The patient was advised to call immediately if he has any concerning symptoms.  All  questions were answered. The patient knows to call the clinic with any problems, questions or concerns. We can certainly see the patient much sooner if necessary.  Disclaimer: This note was dictated with voice recognition software. Similar sounding words can inadvertently be transcribed and may not be corrected upon review.

## 2016-02-21 DIAGNOSIS — H524 Presbyopia: Secondary | ICD-10-CM | POA: Diagnosis not present

## 2016-02-21 DIAGNOSIS — H25812 Combined forms of age-related cataract, left eye: Secondary | ICD-10-CM | POA: Diagnosis not present

## 2016-02-22 DIAGNOSIS — M4317 Spondylolisthesis, lumbosacral region: Secondary | ICD-10-CM | POA: Diagnosis not present

## 2016-02-22 DIAGNOSIS — M4716 Other spondylosis with myelopathy, lumbar region: Secondary | ICD-10-CM | POA: Diagnosis not present

## 2016-02-22 DIAGNOSIS — M4806 Spinal stenosis, lumbar region: Secondary | ICD-10-CM | POA: Diagnosis not present

## 2016-02-29 DIAGNOSIS — E785 Hyperlipidemia, unspecified: Secondary | ICD-10-CM | POA: Diagnosis not present

## 2016-02-29 DIAGNOSIS — K219 Gastro-esophageal reflux disease without esophagitis: Secondary | ICD-10-CM | POA: Diagnosis not present

## 2016-02-29 DIAGNOSIS — Z79899 Other long term (current) drug therapy: Secondary | ICD-10-CM | POA: Diagnosis not present

## 2016-02-29 DIAGNOSIS — I10 Essential (primary) hypertension: Secondary | ICD-10-CM | POA: Diagnosis not present

## 2016-03-07 ENCOUNTER — Ambulatory Visit (INDEPENDENT_AMBULATORY_CARE_PROVIDER_SITE_OTHER): Payer: Medicare Other | Admitting: Urology

## 2016-03-07 ENCOUNTER — Other Ambulatory Visit (HOSPITAL_COMMUNITY)
Admission: RE | Admit: 2016-03-07 | Discharge: 2016-03-07 | Disposition: A | Discharge status: Home / Self Care | Payer: Medicare Other | Source: Other Acute Inpatient Hospital | Attending: Urology | Admitting: Urology

## 2016-03-07 DIAGNOSIS — N393 Stress incontinence (female) (male): Secondary | ICD-10-CM | POA: Insufficient documentation

## 2016-03-07 DIAGNOSIS — N390 Urinary tract infection, site not specified: Secondary | ICD-10-CM | POA: Diagnosis not present

## 2016-03-07 DIAGNOSIS — Z8546 Personal history of malignant neoplasm of prostate: Secondary | ICD-10-CM

## 2016-03-07 LAB — URINALYSIS, ROUTINE W REFLEX MICROSCOPIC
BILIRUBIN URINE: NEGATIVE
Glucose, UA: NEGATIVE mg/dL
HGB URINE DIPSTICK: NEGATIVE
KETONES UR: NEGATIVE mg/dL
Leukocytes, UA: NEGATIVE
NITRITE: NEGATIVE
PH: 6 (ref 5.0–8.0)
Protein, ur: NEGATIVE mg/dL
SPECIFIC GRAVITY, URINE: 1.015 (ref 1.005–1.030)

## 2016-03-10 DIAGNOSIS — I700 Atherosclerosis of aorta: Secondary | ICD-10-CM | POA: Diagnosis not present

## 2016-03-10 DIAGNOSIS — Z6828 Body mass index (BMI) 28.0-28.9, adult: Secondary | ICD-10-CM | POA: Diagnosis not present

## 2016-03-10 DIAGNOSIS — Z23 Encounter for immunization: Secondary | ICD-10-CM | POA: Diagnosis not present

## 2016-03-10 DIAGNOSIS — M1990 Unspecified osteoarthritis, unspecified site: Secondary | ICD-10-CM | POA: Diagnosis not present

## 2016-03-10 DIAGNOSIS — I10 Essential (primary) hypertension: Secondary | ICD-10-CM | POA: Diagnosis not present

## 2016-06-06 DIAGNOSIS — Z79899 Other long term (current) drug therapy: Secondary | ICD-10-CM | POA: Diagnosis not present

## 2016-06-06 DIAGNOSIS — E785 Hyperlipidemia, unspecified: Secondary | ICD-10-CM | POA: Diagnosis not present

## 2016-06-13 DIAGNOSIS — I700 Atherosclerosis of aorta: Secondary | ICD-10-CM | POA: Diagnosis not present

## 2016-06-13 DIAGNOSIS — Z23 Encounter for immunization: Secondary | ICD-10-CM | POA: Diagnosis not present

## 2016-06-13 DIAGNOSIS — E785 Hyperlipidemia, unspecified: Secondary | ICD-10-CM | POA: Diagnosis not present

## 2016-08-22 DIAGNOSIS — M4317 Spondylolisthesis, lumbosacral region: Secondary | ICD-10-CM | POA: Diagnosis not present

## 2016-10-24 DIAGNOSIS — M1712 Unilateral primary osteoarthritis, left knee: Secondary | ICD-10-CM | POA: Diagnosis not present

## 2016-10-24 DIAGNOSIS — Z6829 Body mass index (BMI) 29.0-29.9, adult: Secondary | ICD-10-CM | POA: Diagnosis not present

## 2016-10-24 DIAGNOSIS — I10 Essential (primary) hypertension: Secondary | ICD-10-CM | POA: Diagnosis not present

## 2016-11-21 ENCOUNTER — Ambulatory Visit (INDEPENDENT_AMBULATORY_CARE_PROVIDER_SITE_OTHER): Payer: Medicare Other | Admitting: Orthopaedic Surgery

## 2016-11-21 ENCOUNTER — Ambulatory Visit (INDEPENDENT_AMBULATORY_CARE_PROVIDER_SITE_OTHER): Payer: Medicare Other

## 2016-11-21 ENCOUNTER — Encounter (INDEPENDENT_AMBULATORY_CARE_PROVIDER_SITE_OTHER): Payer: Self-pay | Admitting: Orthopaedic Surgery

## 2016-11-21 VITALS — BP 145/89 | HR 83 | Resp 14 | Ht 66.0 in | Wt 185.0 lb

## 2016-11-21 DIAGNOSIS — G8929 Other chronic pain: Secondary | ICD-10-CM

## 2016-11-21 DIAGNOSIS — M25562 Pain in left knee: Secondary | ICD-10-CM | POA: Diagnosis not present

## 2016-11-21 MED ORDER — METHYLPREDNISOLONE ACETATE 40 MG/ML IJ SUSP
80.0000 mg | INTRAMUSCULAR | Status: AC | PRN
  Administered 2016-11-21: 80 mg

## 2016-11-21 MED ORDER — LIDOCAINE HCL 1 % IJ SOLN
5.0000 mL | INTRAMUSCULAR | Status: AC | PRN
  Administered 2016-11-21: 5 mL

## 2016-11-21 MED ORDER — BUPIVACAINE HCL 0.5 % IJ SOLN
3.0000 mL | INTRAMUSCULAR | Status: AC | PRN
  Administered 2016-11-21: 3 mL via INTRA_ARTICULAR

## 2016-11-21 NOTE — Progress Notes (Signed)
Office Visit Note   Patient: [name] Short           Date of Birth: May 13, 1946           MRN: [id number] Visit Date: 11/21/2016              Requested by: Asencion Noble, MD [address] [place], [place] [address] PCP: Asencion Noble, MD   Assessment & Plan: Visit Diagnoses: left knee pain could be related to mild osteoarthritis or possibly a tear of the medial meniscus or both   Plan: Cortisone injection left knee, monitored response over the next several weeks and if no improvement consider MRI scan  Follow-Up Instructions: No Follow-up on file.   Orders:  No orders of the defined types were placed in this encounter.  No orders of the defined types were placed in this encounter.     Procedures: Large Joint Inj Date/Time: 11/21/2016 8:44 AM Performed by: Garald Balding Authorized by: Garald Balding   Consent Given by:  Patient Timeout: prior to procedure the correct patient, procedure, and site was verified   Indications:  Pain and joint swelling Location:  Knee Site:  L knee Prep: patient was prepped and draped in usual sterile fashion   Needle Size:  25 G Needle Length:  1.5 inches Approach:  Anteromedial Ultrasound Guidance: No   Fluoroscopic Guidance: No   Arthrogram: No   Medications:  5 mL lidocaine 1 %; 80 mg methylPREDNISolone acetate 40 MG/ML; 3 mL bupivacaine 0.5 % Aspiration Attempted: No   Patient tolerance:  Patient tolerated the procedure well with no immediate complications     Clinical Data: No additional findings.   Subjective: No chief complaint on file.   Pt presents with chronic knee pain much worse the last 6 months. Denies injury, back surgery 09/2015 by Dr. Carloyn Manner.   [name] Short denies any history of injury or trauma. Occasionally he'll experience a feeling of tightness and predominantly medial joint pain. He's had difficulty being up and around on his feet as a result of the discomfort. Prior to his back surgery was having lower  extremity discomfort but that also resolved after that the surgery by Dr. Carloyn Manner .now is having recurrent symptoms of his left knee  Review of Systems   Objective: Vital Signs: There were no vitals taken for this visit.  Physical Exam  Ortho Exam left knee exam with minimal effusion. Predominantly medial joint pain without increased varus or valgus. Moderate patellar crepitation without pain. No calf pain. Neurovascular exam intact distally. No swelling distally. Painless range of motion of both hips. Straight leg raise negative. No evidence of instability  Specialty Comments:  No specialty comments available.  Imaging: No results found.   PMFS History: Patient Active Problem List   Diagnosis Date Noted  . Organic erectile dysfunction 08/11/2012  . Cancer of left lung ([place]) 04/27/2012   Past Medical History:  Diagnosis Date  . Arthritis    ESP IN HANDS  . Cancer ([facility])    OCCULAR RIGHT EYE--SURGERY AND CHEMO  PT IS LEGALLY BLIND IN RT EYE  . Cataract    LEFT EYE  . COPD (chronic obstructive pulmonary disease) ([place])    FORMER SMOKER  . Erectile dysfunction   . GERD (gastroesophageal reflux disease)   . Glaucoma    ONLY IN RIGHT EYE  . Hypertension   . Lung cancer ([facility])    lung ca dx 02/2010  . Pneumonia 2011  . Prostate cancer ([place])  prostate ca dx 11/11  . Shortness of breath    ONLY WITH EXERTION; HX OF LOBECTOMY FOR CANER    Family History  Problem Relation Age of Onset  . Colon polyps Brother   . Colon polyps Brother   . Colon polyps Brother   . Colon polyps Brother     Past Surgical History:  Procedure Laterality Date  . APPENDECTOMY  JUNE 2000  . COLONOSCOPY  08/06/2012   Procedure: COLONOSCOPY;  Surgeon: Rogene Houston, MD;  [facility];  Service: Endoscopy;  Laterality: N/A;  955  . COLONOSCOPY N/A 08/08/2015   Procedure: COLONOSCOPY;  Surgeon: Rogene Houston, MD;  [facility];  Service: Endoscopy;  Laterality: N/A;  1200  .  EYE SURGERY     OCT 1999 DETACHED RETINA -RIGHT; MAY AND JUNE 2009 RIGHT EYE SURGERY FOR CANCER-LENS HAS BEEN REMOVED.  [name] LEFT UPPER LOBECTOMY  03/2010   FOR CANCER  . PENILE PROSTHESIS IMPLANT  08/10/2012   Procedure: PENILE PROTHESIS INFLATABLE;  Surgeon: Malka So, MD;  [facility];  Service: Urology;  Laterality: N/A;  IMPLANT 3 PIECE PENILE PROSTHESIS   . PROSTATECTOMY  DEC 2011   FOR CANCER   Social History   Occupational History  . Not on file.   Social History Main Topics  . Smoking status: Former Smoker    Packs/day: 1.50    Years: 40.00    Quit date: 09/07/2008  . Smokeless tobacco: Former Systems developer    Types: Chew    Quit date: 10/08/1985  . Alcohol use No  . Drug use: No  . Sexual activity: Not on file

## 2017-01-02 ENCOUNTER — Encounter (INDEPENDENT_AMBULATORY_CARE_PROVIDER_SITE_OTHER): Payer: Self-pay | Admitting: Orthopaedic Surgery

## 2017-01-02 ENCOUNTER — Other Ambulatory Visit (INDEPENDENT_AMBULATORY_CARE_PROVIDER_SITE_OTHER): Payer: Self-pay

## 2017-01-02 ENCOUNTER — Ambulatory Visit (INDEPENDENT_AMBULATORY_CARE_PROVIDER_SITE_OTHER): Payer: Medicare Other | Admitting: Orthopaedic Surgery

## 2017-01-02 VITALS — BP 159/86 | HR 79 | Resp 14 | Ht 68.0 in | Wt 195.0 lb

## 2017-01-02 DIAGNOSIS — M25562 Pain in left knee: Secondary | ICD-10-CM | POA: Diagnosis not present

## 2017-01-02 DIAGNOSIS — G8929 Other chronic pain: Secondary | ICD-10-CM | POA: Diagnosis not present

## 2017-01-02 DIAGNOSIS — G5621 Lesion of ulnar nerve, right upper limb: Secondary | ICD-10-CM | POA: Diagnosis not present

## 2017-01-02 MED ORDER — METHYLPREDNISOLONE ACETATE 40 MG/ML IJ SUSP
40.0000 mg | INTRAMUSCULAR | Status: AC | PRN
  Administered 2017-01-02: 40 mg

## 2017-01-02 MED ORDER — LIDOCAINE HCL 1 % IJ SOLN
1.0000 mL | INTRAMUSCULAR | Status: AC | PRN
  Administered 2017-01-02: 1 mL

## 2017-01-02 NOTE — Progress Notes (Signed)
Office Visit Note   Patient: [name] Short           Date of Birth: 1946/03/25           MRN: [id number] Visit Date: 01/02/2017              Requested by: Asencion Noble, MD [address] [place], [place] [address] PCP: Asencion Noble, MD   Assessment & Plan: Visit Diagnoses:  1. Entrapment of right ulnar nerve   2. Chronic pain of left knee     Plan: Injection over ulnar nerve at right elbow, MRI scan left knee  Follow-Up Instructions: Return in about 2 weeks (around 01/16/2017).   Orders:  No orders of the defined types were placed in this encounter.  No orders of the defined types were placed in this encounter.     Procedures: Ulna nerve right elbow Date/Time: 01/02/2017 9:07 AM Performed by: Garald Balding Authorized by: Garald Balding   Consent Given by:  Patient Site marked: the procedure site was marked   Timeout: prior to procedure the correct patient, procedure, and site was verified   Indications:  Pain Condition: medial epicondylitis   Condition comment:  Ulna nerve elbow Site:  R elbow Prep: patient was prepped and draped in usual sterile fashion   Needle Size:  27 G Approach:  Medial Ultrasound Guidance: No   Medications:  1 mL lidocaine 1 %; 40 mg methylPREDNISolone acetate 40 MG/ML     Clinical Data: No additional findings.   Subjective: Chief Complaint  Patient presents with  . Right Elbow - Numbness, Pain, Weakness    [name] Short is a 71 y o that presents  With R elbow pain that radiates into wrist and hand. Pt relates his 4th and 5th fingers are numb "like arm is asleep" and has been that way since 6 weeks (11/21/16 had cortisone inj in L knee)  [name] Short was seen about 70 weeks ago for evaluation of left knee pain. His plain films demonstrated some osteoarthritis in all 3 compartments. Cortisone injection provided significant relief but he still having some occasional pain associated with the sensation of his knee giving way. As of the day  after he was in the office and upon awakening he was experiencing numbness in the ulnar 2 digits of his right hand he has "feeling" but it is "tingling". He is not experiencing any pain  HPI  Review of Systems   Objective: Vital Signs: BP (!) 159/86   Pulse 79   Resp 14   Ht '5\' 8"'$  (1.727 m)   Wt 195 lb (88.5 kg)   BMI 29.65 kg/m   Physical Exam  Ortho Exam knee without effusion. Positive patellar crepitation. Positive posterior medial joint pain without popping or clicking. Full extension and flexion over 105. No instability. No popliteal pain. No calf discomfort. Mild tenderness over the ulnar nerve at its groove right elbow full range of motion of elbow. Tingling in the ulnar 2 digits right hand. Motor exam intact.  Specialty Comments:  No specialty comments available.  Imaging: No results found.   PMFS History: Patient Active Problem List   Diagnosis Date Noted  . Organic erectile dysfunction 08/11/2012  . Cancer of left lung ([place]) 04/27/2012   Past Medical History:  Diagnosis Date  . Arthritis    ESP IN HANDS  . Cancer ([facility])    OCCULAR RIGHT EYE--SURGERY AND CHEMO  PT IS LEGALLY BLIND IN RT EYE  . Cataract  LEFT EYE  . COPD (chronic obstructive pulmonary disease) ([place])    FORMER SMOKER  . Erectile dysfunction   . GERD (gastroesophageal reflux disease)   . Glaucoma    ONLY IN RIGHT EYE  . Hypertension   . Lung cancer ([place])    lung ca dx 02/2010  . Pneumonia 2011  . Prostate cancer [facility])    prostate ca dx 11/11  . Shortness of breath    ONLY WITH EXERTION; HX OF LOBECTOMY FOR CANER    Family History  Problem Relation Age of Onset  . Colon polyps Brother   . Colon polyps Brother   . Colon polyps Brother   . Colon polyps Brother     Past Surgical History:  Procedure Laterality Date  . APPENDECTOMY  JUNE 2000  . COLONOSCOPY  08/06/2012   Procedure: COLONOSCOPY;  Surgeon: Rogene Houston, MD;  [facility];  Service: Endoscopy;   Laterality: N/A;  955  . COLONOSCOPY N/A 08/08/2015   Procedure: COLONOSCOPY;  Surgeon: Rogene Houston, MD;  [facility];  Service: Endoscopy;  Laterality: N/A;  1200  . EYE SURGERY     OCT 1999 DETACHED RETINA -RIGHT; MAY AND JUNE 2009 RIGHT EYE SURGERY FOR CANCER-LENS HAS BEEN REMOVED.  [name] LEFT UPPER LOBECTOMY  03/2010   FOR CANCER  . PENILE PROSTHESIS IMPLANT  08/10/2012   Procedure: PENILE PROTHESIS INFLATABLE;  Surgeon: Malka So, MD;  [facility];  Service: Urology;  Laterality: N/A;  IMPLANT 3 PIECE PENILE PROSTHESIS   . PROSTATECTOMY  DEC 2011   FOR CANCER   Social History   Occupational History  . Not on file.   Social History Main Topics  . Smoking status: Former Smoker    Packs/day: 1.50    Years: 40.00    Quit date: 09/07/2008  . Smokeless tobacco: Former Systems developer    Types: Chew    Quit date: 10/08/1985  . Alcohol use No  . Drug use: No  . Sexual activity: Not on file

## 2017-01-08 ENCOUNTER — Ambulatory Visit (HOSPITAL_COMMUNITY)
Admission: RE | Admit: 2017-01-08 | Discharge: 2017-01-08 | Disposition: A | Discharge status: Home / Self Care | Payer: Medicare Other | Source: Ambulatory Visit | Attending: Orthopaedic Surgery | Admitting: Orthopaedic Surgery

## 2017-01-08 DIAGNOSIS — S83242A Other tear of medial meniscus, current injury, left knee, initial encounter: Secondary | ICD-10-CM | POA: Diagnosis not present

## 2017-01-08 DIAGNOSIS — M25562 Pain in left knee: Secondary | ICD-10-CM

## 2017-01-08 DIAGNOSIS — X58XXXA Exposure to other specified factors, initial encounter: Secondary | ICD-10-CM | POA: Diagnosis not present

## 2017-01-08 DIAGNOSIS — G8929 Other chronic pain: Secondary | ICD-10-CM

## 2017-01-08 DIAGNOSIS — M1712 Unilateral primary osteoarthritis, left knee: Secondary | ICD-10-CM | POA: Diagnosis not present

## 2017-01-16 ENCOUNTER — Ambulatory Visit (INDEPENDENT_AMBULATORY_CARE_PROVIDER_SITE_OTHER): Payer: Medicare Other | Admitting: Orthopaedic Surgery

## 2017-01-16 ENCOUNTER — Encounter (INDEPENDENT_AMBULATORY_CARE_PROVIDER_SITE_OTHER): Payer: Self-pay | Admitting: Orthopaedic Surgery

## 2017-01-16 VITALS — BP 128/66 | HR 77 | Ht 65.0 in | Wt 195.0 lb

## 2017-01-16 DIAGNOSIS — M25521 Pain in right elbow: Secondary | ICD-10-CM | POA: Diagnosis not present

## 2017-01-16 DIAGNOSIS — G8929 Other chronic pain: Secondary | ICD-10-CM | POA: Diagnosis not present

## 2017-01-16 DIAGNOSIS — M25562 Pain in left knee: Secondary | ICD-10-CM | POA: Diagnosis not present

## 2017-01-16 NOTE — Progress Notes (Signed)
Office Visit Note   Patient: [name] Short           Date of Birth: 1945-12-22           MRN: [id number] Visit Date: 01/16/2017              Requested by: Asencion Noble, MD [address] [place], [place] [address] PCP: Asencion Noble, MD   Assessment & Plan: Visit Diagnoses:  1. Chronic pain of left knee   2. Pain in right elbow   Left knee pain is a combination of degenerative changes in the medial compartment and a tear of the medial meniscus. Mr. Blatt relates that he actually is better after the cortisone injection. See MRI scan report below. Right elbow pain is related to the ulnar nerve compression along the medial aspect of the elbow.  Plan: Monitor her left knee over time. Presently doing well. May consider further cortisone injection or Visco supplementation. Last resort would be arthroscopy to remove the meniscal tear. Right elbow pain is related to compression of the ulnar nerve at its groove medially. He has had trouble now for over 2 months and no better with a cortisone injection. I think the next best approach would be decompression of the ulnar nerve without transposition. I discussed this in detail with him and he would like to proceed .Long discussion regarding both issues over 30 minutes.  Follow-Up Instructions: Return for will schedule surgery to release ulna nerve right elbow.   Orders:  No orders of the defined types were placed in this encounter.  No orders of the defined types were placed in this encounter.     Procedures: No procedures performed   Clinical Data: No additional findings.   Subjective:  Mr. Heft returns for follow-up evaluation of his right elbow and left knee. MRI scan demonstrates a horizontal tear in the posterior horn of the medial meniscus the body is degenerative and diminutive tear with fraying along its free edge. He also has moderate to moderately severe medial compartment osteoarthritis. There is little to no patellofemoral or  lateral compartment degenerative change. He is feeling much better He continues to have pain along the medial aspect of his right elbow with tingling into the ulnar 2 digits of his right hand. He has evidence of ulnar nerve compression with a positive Tinel's along the medial aspect of his right elbow. HPI  Review of Systems   Objective: Vital Signs: BP 128/66   Pulse 77   Ht '5\' 5"'$  (1.651 m)   Wt 195 lb (88.5 kg)   BMI 32.45 kg/m   Physical Exam  Ortho Exam left knee with mild medial joint pain diffusely rather than just posteriorly. Stability. No effusion today. No patella pain. Full extension and flexes over 105. No calf pain. No swelling distally. Straight leg raise negative bilaterally. Negative pain with hip range of motion. Right elbow pain directly over the ulnar nerve at its groove. Positive Tinel's. Full range of motion of elbow. No swelling. Neurovascular exam intact. Diffuse degenerative changes in right hand at the PIP and DIP joints.  Specialty Comments:  No specialty comments available.  Imaging: No results found.   PMFS History: Patient Active Problem List   Diagnosis Date Noted  . Organic erectile dysfunction 08/11/2012  . Cancer of left lung ([place]) 04/27/2012   Past Medical History:  Diagnosis Date  . Arthritis    ESP IN HANDS  . Cancer ([facility])    OCCULAR RIGHT EYE--SURGERY AND CHEMO  PT IS LEGALLY BLIND IN RT EYE  . Cataract    LEFT EYE  . COPD (chronic obstructive pulmonary disease) ([place])    FORMER SMOKER  . Erectile dysfunction   . GERD (gastroesophageal reflux disease)   . Glaucoma    ONLY IN RIGHT EYE  . Hypertension   . Lung cancer ([place])    lung ca dx 02/2010  . Pneumonia 2011  . Prostate cancer [facility])    prostate ca dx 11/11  . Shortness of breath    ONLY WITH EXERTION; HX OF LOBECTOMY FOR CANER    Family History  Problem Relation Age of Onset  . Colon polyps Brother   . Colon polyps Brother   . Colon polyps Brother   . Colon polyps  Brother     Past Surgical History:  Procedure Laterality Date  . APPENDECTOMY  JUNE 2000  . COLONOSCOPY  08/06/2012   Procedure: COLONOSCOPY;  Surgeon: Rogene Houston, MD;  [facility];  Service: Endoscopy;  Laterality: N/A;  955  . COLONOSCOPY N/A 08/08/2015   Procedure: COLONOSCOPY;  Surgeon: Rogene Houston, MD;  [facility];  Service: Endoscopy;  Laterality: N/A;  1200  . EYE SURGERY     OCT 1999 DETACHED RETINA -RIGHT; MAY AND JUNE 2009 RIGHT EYE SURGERY FOR CANCER-LENS HAS BEEN REMOVED.  [name] LEFT UPPER LOBECTOMY  03/2010   FOR CANCER  . PENILE PROSTHESIS IMPLANT  08/10/2012   Procedure: PENILE PROTHESIS INFLATABLE;  Surgeon: Malka So, MD;  [facility];  Service: Urology;  Laterality: N/A;  IMPLANT 3 PIECE PENILE PROSTHESIS   . PROSTATECTOMY  DEC 2011   FOR CANCER   Social History   Occupational History  . Not on file.   Social History Main Topics  . Smoking status: Former Smoker    Packs/day: 1.50    Years: 40.00    Quit date: 09/07/2008  . Smokeless tobacco: Former Systems developer    Types: Chew    Quit date: 10/08/1985  . Alcohol use No  . Drug use: No  . Sexual activity: Not on file     Garald Balding, MD   Note - This record has been created using [employer].  Chart creation errors have been sought, but may not always  have been located. Such creation errors do not reflect on  the standard of medical care.

## 2017-01-22 DIAGNOSIS — G5601 Carpal tunnel syndrome, right upper limb: Secondary | ICD-10-CM | POA: Diagnosis not present

## 2017-01-22 DIAGNOSIS — G5621 Lesion of ulnar nerve, right upper limb: Secondary | ICD-10-CM | POA: Diagnosis not present

## 2017-01-29 ENCOUNTER — Encounter (INDEPENDENT_AMBULATORY_CARE_PROVIDER_SITE_OTHER): Payer: Self-pay | Admitting: Orthopaedic Surgery

## 2017-01-29 ENCOUNTER — Ambulatory Visit (INDEPENDENT_AMBULATORY_CARE_PROVIDER_SITE_OTHER): Payer: Self-pay | Admitting: Orthopaedic Surgery

## 2017-01-29 VITALS — BP 135/73 | HR 81 | Resp 15 | Ht 67.0 in | Wt 185.0 lb

## 2017-01-29 DIAGNOSIS — G5621 Lesion of ulnar nerve, right upper limb: Secondary | ICD-10-CM

## 2017-01-29 DIAGNOSIS — Z9889 Other specified postprocedural states: Secondary | ICD-10-CM

## 2017-01-29 NOTE — Progress Notes (Signed)
Office Visit Note   Patient: [name] Short           Date of Birth: February 19, 1946           MRN: [id number] Visit Date: 01/29/2017              Requested by: Asencion Noble, MD [address] [place], [place] [address] PCP: Asencion Noble, MD   Assessment & Plan: Visit Diagnoses:  1. Post-operative state   2. Ulnar neuropathy of right upper extremity     Plan:  #1: Dressing is splint removed. #2: Placed in a soft dressing  Follow-Up Instructions: Return in about 1 week (around 02/05/2017).   Orders:  No orders of the defined types were placed in this encounter.  No orders of the defined types were placed in this encounter.     Procedures: No procedures performed   Clinical Data: No additional findings.   Subjective: Chief Complaint  Patient presents with  . Right Elbow - Routine Post Op  . Elbow Pain    01/23/17 Surgery right elbow, no pain, doing good, numbness    Patient returns today for follow-up of his right ulnar nerve release. He has not really had much pain with this. Still has some numbness in his fingers actively.    Review of Systems   Objective: Vital Signs: BP 135/73 (BP Location: Left Arm, Patient Position: Sitting, Cuff Size: Normal)   Pulse 81   Resp 15   Ht '5\' 7"'$  (1.702 m)   Wt 185 lb (83.9 kg)   BMI 28.98 kg/m   Physical Exam  Ortho Exam  Exam today reveals his wounds. Per primam no signs of infection. Steri-Strips in place. A really much swelling at all. He has a negative Tinel's at the canal-year-old. He is able to abduct the fingers appropriately with good strength.  Specialty Comments:  No specialty comments available.  Imaging: No results found.  Current Outpatient Prescriptions  Medication Sig Dispense Refill  . acetaminophen (TYLENOL) 650 MG CR tablet Take 650 mg by mouth every morning. Reported on 02/06/2016    . atorvastatin (LIPITOR) 20 MG tablet     . chlorthalidone (HYGROTON) 25 MG tablet Take 12.5 mg by mouth daily.      . diphenhydrAMINE (BENADRYL) 25 MG tablet Take 25 mg by mouth every 6 (six) hours as needed. Reported on 02/06/2016    . losartan (COZAAR) 100 MG tablet Take 100 mg by mouth daily.    [name] NIFEdipine (ADALAT CC) 90 MG 24 hr tablet Take 90 mg by mouth daily.    [name] NIFEdipine (PROCARDIA XL/ADALAT-CC) 90 MG 24 hr tablet     . omeprazole (PRILOSEC) 20 MG capsule Take 20 mg by mouth daily.    [name] oxyCODONE-acetaminophen (PERCOCET/ROXICET) 5-325 MG tablet     . brimonidine (ALPHAGAN) 0.15 % ophthalmic solution Place 1 drop into the right eye daily.      No current facility-administered medications for this visit.     PMFS History: Patient Active Problem List   Diagnosis Date Noted  . Organic erectile dysfunction 08/11/2012  . Cancer of left lung ([place]) 04/27/2012   Past Medical History:  Diagnosis Date  . Arthritis    ESP IN HANDS  . Cancer ([place])    OCCULAR RIGHT EYE--SURGERY AND CHEMO  PT IS LEGALLY BLIND IN RT EYE  . Cataract    LEFT EYE  . COPD (chronic obstructive pulmonary disease) ([place])    FORMER SMOKER  . Erectile dysfunction   .  GERD (gastroesophageal reflux disease)   . Glaucoma    ONLY IN RIGHT EYE  . Hypertension   . Lung cancer ([place])    lung ca dx 02/2010  . Pneumonia 2011  . Prostate cancer [facility])    prostate ca dx 11/11  . Shortness of breath    ONLY WITH EXERTION; HX OF LOBECTOMY FOR CANER    Family History  Problem Relation Age of Onset  . Colon polyps Brother   . Colon polyps Brother   . Colon polyps Brother   . Colon polyps Brother     Past Surgical History:  Procedure Laterality Date  . APPENDECTOMY  JUNE 2000  . COLONOSCOPY  08/06/2012   Procedure: COLONOSCOPY;  Surgeon: Rogene Houston, MD;  [facility];  Service: Endoscopy;  Laterality: N/A;  955  . COLONOSCOPY N/A 08/08/2015   Procedure: COLONOSCOPY;  Surgeon: Rogene Houston, MD;  [facility];  Service: Endoscopy;  Laterality: N/A;  1200  . ELBOW SURGERY    . EYE SURGERY      OCT 1999 DETACHED RETINA -RIGHT; MAY AND JUNE 2009 RIGHT EYE SURGERY FOR CANCER-LENS HAS BEEN REMOVED.  [name] LEFT UPPER LOBECTOMY  03/2010   FOR CANCER  . PENILE PROSTHESIS IMPLANT  08/10/2012   Procedure: PENILE PROTHESIS INFLATABLE;  Surgeon: Malka So, MD;  [facility];  Service: Urology;  Laterality: N/A;  IMPLANT 3 PIECE PENILE PROSTHESIS   . PROSTATECTOMY  DEC 2011   FOR CANCER   Social History   Occupational History  . Not on file.   Social History Main Topics  . Smoking status: Former Smoker    Packs/day: 1.00    Years: 40.00    Types: Cigarettes    Quit date: 09/07/2008  . Smokeless tobacco: Former Systems developer    Types: Chew    Quit date: 10/08/1985  . Alcohol use No  . Drug use: No  . Sexual activity: Not on file

## 2017-01-30 ENCOUNTER — Inpatient Hospital Stay (INDEPENDENT_AMBULATORY_CARE_PROVIDER_SITE_OTHER): Payer: Medicare Other | Admitting: Orthopaedic Surgery

## 2017-02-06 ENCOUNTER — Ambulatory Visit (INDEPENDENT_AMBULATORY_CARE_PROVIDER_SITE_OTHER): Payer: Medicare Other | Admitting: Orthopaedic Surgery

## 2017-02-06 DIAGNOSIS — M25562 Pain in left knee: Secondary | ICD-10-CM | POA: Diagnosis not present

## 2017-02-06 DIAGNOSIS — G8929 Other chronic pain: Secondary | ICD-10-CM | POA: Diagnosis not present

## 2017-02-06 DIAGNOSIS — M25521 Pain in right elbow: Secondary | ICD-10-CM

## 2017-02-06 MED ORDER — BUPIVACAINE HCL 0.5 % IJ SOLN
3.0000 mL | INTRAMUSCULAR | Status: AC | PRN
  Administered 2017-02-06: 3 mL via INTRA_ARTICULAR

## 2017-02-06 MED ORDER — METHYLPREDNISOLONE ACETATE 40 MG/ML IJ SUSP
80.0000 mg | INTRAMUSCULAR | Status: AC | PRN
  Administered 2017-02-06: 80 mg

## 2017-02-06 MED ORDER — LIDOCAINE HCL 1 % IJ SOLN
5.0000 mL | INTRAMUSCULAR | Status: AC | PRN
  Administered 2017-02-06: 5 mL

## 2017-02-06 NOTE — Progress Notes (Signed)
Office Visit Note   Patient: [name] Short           Date of Birth: March 13, 1946           MRN: [id number] Visit Date: 02/06/2017              Requested by: Asencion Noble, MD [address] [place], [place] [address] PCP: Asencion Noble, MD   Assessment & Plan: Visit Diagnoses:  1. Chronic pain of left knee   2. Pain in right elbow   2 weeks status post decompression of ulnar nerve medial aspect of right elbow. No longer has any pain in the elbow or Tinel's over the nerve but still experiencing some tingling into the ulnar 2 digits and height both thenar eminence. Also having some trouble with his left knee which was previously scanned. MRI scan demonstrated tear of the medial meniscus  Plan: Repeat cortisone injection left knee. If continues to be a problem I would suggest arthroscopy. Right elbow incision looks just fine. Still having some trouble with numbness in the ulnar 2 digits. I think it's worth obtaining EMGs and nerve conduction studies if this doesn't resolve  Follow-Up Instructions: Return in about 2 weeks (around 02/20/2017).   Orders:  No orders of the defined types were placed in this encounter.  No orders of the defined types were placed in this encounter.     Procedures: Large Joint Inj Date/Time: 02/06/2017 10:58 AM Performed by: Garald Balding Authorized by: Garald Balding   Consent Given by:  Patient Timeout: prior to procedure the correct patient, procedure, and site was verified   Indications:  Pain and joint swelling Location:  Knee Site:  L knee Prep: patient was prepped and draped in usual sterile fashion   Needle Size:  25 G Needle Length:  1.5 inches Approach:  Anteromedial Ultrasound Guidance: No   Fluoroscopic Guidance: No   Arthrogram: No   Medications:  5 mL lidocaine 1 %; 80 mg methylPREDNISolone acetate 40 MG/ML; 3 mL bupivacaine 0.5 % Aspiration Attempted: No   Patient tolerance:  Patient tolerated the procedure well with no  immediate complications     Clinical Data: No additional findings.   Subjective: Chief Complaint  Patient presents with  . Right Elbow - Routine Post Op    Ulnar neuropathy of right upper extremity   2 weeks status post decompression of ulnar nerve the medial aspect of the right elbow. He no longer has any Tinel's or pain at the elbow but still having some numbness in the ulnar 2 digits. This been no loss of motor function. He's been quite comfortable and not taking any pain medicine. He denies neck pain. He also was had a prior MRI scan of his left knee demonstrating a tear of the medial meniscus was some mild degenerative changes in all 3 compartments. He's had a cortisone injection which is alleviated a lot of his discomfort. He like to have another today  HPI  Review of Systems   Objective: Vital Signs: There were no vitals taken for this visit.  Physical Exam  Ortho Exam right elbow demonstrates the incision to be healed without evidence of infection. No Tinel's or pain over the ulnar nerve. No Tinel's along the forearm. Normal motor exam in regards to the to the hand and specifically the ulnar nerve. No pain with range of motion of the cervical spine. No pain over the nerve on the volar aspect of the wrist. Good capillary refill. Left  knee with specific pain along the posterior medial corner probably consistent with a tear of the medial meniscus. No effusion. No anterior lateral or medial joint pain.  Specialty Comments:  No specialty comments available.  Imaging: No results found.   PMFS History: Patient Active Problem List   Diagnosis Date Noted  . Organic erectile dysfunction 08/11/2012  . Cancer of left lung ([place]) 04/27/2012   Past Medical History:  Diagnosis Date  . Arthritis    ESP IN HANDS  . Cancer ([place])    OCCULAR RIGHT EYE--SURGERY AND CHEMO  PT IS LEGALLY BLIND IN RT EYE  . Cataract    LEFT EYE  . COPD (chronic obstructive pulmonary disease) ([place])     FORMER SMOKER  . Erectile dysfunction   . GERD (gastroesophageal reflux disease)   . Glaucoma    ONLY IN RIGHT EYE  . Hypertension   . Lung cancer ([place])    lung ca dx 02/2010  . Pneumonia 2011  . Prostate cancer [facility])    prostate ca dx 11/11  . Shortness of breath    ONLY WITH EXERTION; HX OF LOBECTOMY FOR CANER    Family History  Problem Relation Age of Onset  . Colon polyps Brother   . Colon polyps Brother   . Colon polyps Brother   . Colon polyps Brother     Past Surgical History:  Procedure Laterality Date  . APPENDECTOMY  JUNE 2000  . COLONOSCOPY  08/06/2012   Procedure: COLONOSCOPY;  Surgeon: Rogene Houston, MD;  [facility];  Service: Endoscopy;  Laterality: N/A;  955  . COLONOSCOPY N/A 08/08/2015   Procedure: COLONOSCOPY;  Surgeon: Rogene Houston, MD;  [facility];  Service: Endoscopy;  Laterality: N/A;  1200  . ELBOW SURGERY    . EYE SURGERY     OCT 1999 DETACHED RETINA -RIGHT; MAY AND JUNE 2009 RIGHT EYE SURGERY FOR CANCER-LENS HAS BEEN REMOVED.  [name] LEFT UPPER LOBECTOMY  03/2010   FOR CANCER  . PENILE PROSTHESIS IMPLANT  08/10/2012   Procedure: PENILE PROTHESIS INFLATABLE;  Surgeon: Malka So, MD;  [facility];  Service: Urology;  Laterality: N/A;  IMPLANT 3 PIECE PENILE PROSTHESIS   . PROSTATECTOMY  DEC 2011   FOR CANCER   Social History   Occupational History  . Not on file.   Social History Main Topics  . Smoking status: Former Smoker    Packs/day: 1.00    Years: 40.00    Types: Cigarettes    Quit date: 09/07/2008  . Smokeless tobacco: Former Systems developer    Types: Chew    Quit date: 10/08/1985  . Alcohol use No  . Drug use: No  . Sexual activity: Not on file     Garald Balding, MD   Note - This record has been created using [employer].  Chart creation errors have been sought, but may not always  have been located. Such creation errors do not reflect on  the standard of medical care.

## 2017-02-27 ENCOUNTER — Ambulatory Visit (INDEPENDENT_AMBULATORY_CARE_PROVIDER_SITE_OTHER): Payer: Medicare Other | Admitting: Orthopaedic Surgery

## 2017-02-27 DIAGNOSIS — G5621 Lesion of ulnar nerve, right upper limb: Secondary | ICD-10-CM

## 2017-02-27 DIAGNOSIS — M79641 Pain in right hand: Secondary | ICD-10-CM

## 2017-02-27 NOTE — Progress Notes (Signed)
Office Visit Note   Patient: [name] Short           Date of Birth: 1946-04-05           MRN: [id number] Visit Date: 02/27/2017              Requested by: Asencion Noble, MD [address] [place], [place] [address] PCP: Asencion Noble, MD   Assessment & Plan: Visit Diagnoses:  1. Pain in right hand   2. Ulnar nerve compression, right   5 weeks status post decompression of ulnar nerve medial aspect right elbow with resolution of pain but still having some numbness and tingling in the ulnar 2 digits  Plan: EMGs nerve conduction studies right upper extremity  Follow-Up Instructions: Return after EMG's RUE.   Orders:  Orders Placed This Encounter  Procedures  . Ambulatory referral to Physical Medicine Rehab   No orders of the defined types were placed in this encounter.     Procedures: No procedures performed   Clinical Data: No additional findings.   Subjective: No chief complaint on file. [name] Short is 5 weeks status post decompression of the ulnar nerve the medial aspect of the right elbow. Preoperatively he was having numbness and tingling in the ulnar 2 digits of his right hand associated with considerable pain and Tinel's over the nerve along the medial elbow. He no longer has any pain but still is having the numbness and tingling. He also has some arthritis of his left knee and has done well after the cortisone injection  HPI  Review of Systems   Objective: Vital Signs: There were no vitals taken for this visit.  Physical Exam  Ortho Exam right elbow with well healed incision along the ulnar nerve. No swelling. Negative Tinel's. No pain. Full range of motion of the elbow. Abduction and adduction function is intact to right hand. Still has decreased sensibility in the ulnar 2 digits. No pain with range of motion of cervical spine but with limited motion. Negative Lhermitte's. No pain along the ulnar nerve at the wrist  Specialty Comments:  No specialty  comments available.  Imaging: No results found.   PMFS History: Patient Active Problem List   Diagnosis Date Noted  . Organic erectile dysfunction 08/11/2012  . Cancer of left lung ([place]) 04/27/2012   Past Medical History:  Diagnosis Date  . Arthritis    ESP IN HANDS  . Cancer ([place])    OCCULAR RIGHT EYE--SURGERY AND CHEMO  PT IS LEGALLY BLIND IN RT EYE  . Cataract    LEFT EYE  . COPD (chronic obstructive pulmonary disease) ([place])    FORMER SMOKER  . Erectile dysfunction   . GERD (gastroesophageal reflux disease)   . Glaucoma    ONLY IN RIGHT EYE  . Hypertension   . Lung cancer ([place])    lung ca dx 02/2010  . Pneumonia 2011  . Prostate cancer [facility])    prostate ca dx 11/11  . Shortness of breath    ONLY WITH EXERTION; HX OF LOBECTOMY FOR CANER    Family History  Problem Relation Age of Onset  . Colon polyps Brother   . Colon polyps Brother   . Colon polyps Brother   . Colon polyps Brother     Past Surgical History:  Procedure Laterality Date  . APPENDECTOMY  JUNE 2000  . COLONOSCOPY  08/06/2012   Procedure: COLONOSCOPY;  Surgeon: Rogene Houston, MD;  [facility];  Service: Endoscopy;  Laterality: N/A;  955  . COLONOSCOPY N/A 08/08/2015   Procedure: COLONOSCOPY;  Surgeon: Rogene Houston, MD;  [facility];  Service: Endoscopy;  Laterality: N/A;  1200  . ELBOW SURGERY    . EYE SURGERY     OCT 1999 DETACHED RETINA -RIGHT; MAY AND JUNE 2009 RIGHT EYE SURGERY FOR CANCER-LENS HAS BEEN REMOVED.  [name] LEFT UPPER LOBECTOMY  03/2010   FOR CANCER  . PENILE PROSTHESIS IMPLANT  08/10/2012   Procedure: PENILE PROTHESIS INFLATABLE;  Surgeon: Malka So, MD;  [facility];  Service: Urology;  Laterality: N/A;  IMPLANT 3 PIECE PENILE PROSTHESIS   . PROSTATECTOMY  DEC 2011   FOR CANCER   Social History   Occupational History  . Not on file.   Social History Main Topics  . Smoking status: Former Smoker    Packs/day: 1.00    Years: 40.00    Types:  Cigarettes    Quit date: 09/07/2008  . Smokeless tobacco: Former Systems developer    Types: Chew    Quit date: 10/08/1985  . Alcohol use No  . Drug use: No  . Sexual activity: Not on file     Garald Balding, MD   Note - This record has been created using [employer].  Chart creation errors have been sought, but may not always  have been located. Such creation errors do not reflect on  the standard of medical care.

## 2017-03-13 ENCOUNTER — Ambulatory Visit (INDEPENDENT_AMBULATORY_CARE_PROVIDER_SITE_OTHER): Payer: Medicare Other | Admitting: Physical Medicine and Rehabilitation

## 2017-03-13 ENCOUNTER — Encounter (INDEPENDENT_AMBULATORY_CARE_PROVIDER_SITE_OTHER): Payer: Self-pay | Admitting: Physical Medicine and Rehabilitation

## 2017-03-13 DIAGNOSIS — Z8546 Personal history of malignant neoplasm of prostate: Secondary | ICD-10-CM | POA: Diagnosis not present

## 2017-03-13 DIAGNOSIS — K219 Gastro-esophageal reflux disease without esophagitis: Secondary | ICD-10-CM | POA: Diagnosis not present

## 2017-03-13 DIAGNOSIS — R202 Paresthesia of skin: Secondary | ICD-10-CM

## 2017-03-13 DIAGNOSIS — E785 Hyperlipidemia, unspecified: Secondary | ICD-10-CM | POA: Diagnosis not present

## 2017-03-13 DIAGNOSIS — I10 Essential (primary) hypertension: Secondary | ICD-10-CM | POA: Diagnosis not present

## 2017-03-13 DIAGNOSIS — Z79899 Other long term (current) drug therapy: Secondary | ICD-10-CM | POA: Diagnosis not present

## 2017-03-13 NOTE — Procedures (Signed)
EMG & NCV Findings: Evaluation of the right ulnar motor nerve showed reduced amplitude (2.7 mV), decreased conduction velocity (B Elbow-Wrist, 41 m/s), and decreased conduction velocity (A Elbow-B Elbow, 33 m/s).  The right ulnar sensory nerve showed prolonged distal peak latency (4.4 ms), reduced amplitude (8.2 V), and decreased conduction velocity (Wrist-5th Digit, 32 m/s).  All remaining nerves (as indicated in the following tables) were within normal limits.    Needle evaluation of the right first dorsal interosseous muscle showed increased insertional activity, increased spontaneous activity, and diminished recruitment.  The right flexor digitorum profundus muscle showed increased insertional activity, slightly increased spontaneous activity, and diminished recruitment.  All remaining muscles (as indicated in the following table) showed no evidence of electrical instability.    Impression: The above electrodiagnostic study is ABNORMAL and reveals evidence of a severe right ulnar nerve neuropathy at the elbow affecting sensory and motor components. This appears to be located at the level of the transposition. There is no conduction measured with stimulation at the groove. Stimulation above that area shows reduced amplitude. There is significant slowing of the nerve proximal to this. There is no slowing of the ulnar nerve across the wrist. There is needle EMG findings of axonal injury. Without prior electrodiagnostic study it's impossible to say if this is improved residual.: For it would provide a baseline to no nerve improvement.  There is no significant electrodiagnostic evidence of any other focal nerve entrapment, brachial plexopathy or cervical radiculopathy.   Recommendations: 1.  Follow-up with referring physician. 2.  Continue current management of symptoms. May wish to try neuropathic pain medication such as gabapentin, Lyrica, nortriptyline. 3.  Repeat studies in 3-4 months if symptoms  are progressive or not resolved.   Nerve Conduction Studies Anti Sensory Summary Table   Stim Site NR Peak (ms) Norm Peak (ms) P-T Amp (V) Norm P-T Amp Site1 Site2 Delta-P (ms) Dist (cm) Vel (m/s) Norm Vel (m/s)  Right Median Acr Palm Anti Sensory (2nd Digit)  32C  Wrist    3.6 <3.6 26.1 >10 Wrist Palm 1.9 0.0    Palm    1.7 <2.0 9.3         Right Radial Anti Sensory (Base 1st Digit)  30.9C  Wrist    1.8 <3.1 18.7  Wrist Base 1st Digit 1.8 0.0    Right Ulnar Anti Sensory (5th Digit)  31.7C  Wrist    *4.4 <3.7 *8.2 >15.0 Wrist 5th Digit 4.4 14.0 *32 >38   Motor Summary Table   Stim Site NR Onset (ms) Norm Onset (ms) O-P Amp (mV) Norm O-P Amp Site1 Site2 Delta-0 (ms) Dist (cm) Vel (m/s) Norm Vel (m/s)  Right Median Motor (Abd Poll Brev)  30.8C  Wrist    3.7 <4.2 9.1 >5 Elbow Wrist 4.0 21.0 53 >50  Elbow    7.7  8.8         Right Ulnar Motor (Abd Dig Min)  31.1C  Wrist    4.1 <4.2 *2.7 >3 B Elbow Wrist 5.4 22.0 *41 >53  B Elbow    9.5  0.5  A Elbow B Elbow 3.3 11.0 *33 >53  A Elbow    12.8  1.9          EMG   Side Muscle Nerve Root Ins Act Fibs Psw Amp Dur Poly Recrt Int Fraser Din Comment  Right Abd Poll Brev Median C8-T1 Nml Nml Nml Nml Nml 0 Nml Nml   Right 1stDorInt Ulnar C8-T1 *Incr *3+ *3+ Nml  Nml 0 *Reduced Nml   Right PronatorTeres Median C6-7 Nml Nml Nml Nml Nml 0 Nml Nml   Right Biceps Musculocut C5-6 Nml Nml Nml Nml Nml 0 Nml Nml   Right Deltoid Axillary C5-6 Nml Nml Nml Nml Nml 0 Nml Nml   Right FlexDigProf Ulnar C8,T1 *Incr *1+ *1+ Nml Nml 0 *Reduced Nml     Nerve Conduction Studies Anti Sensory Left/Right Comparison   Stim Site L Lat (ms) R Lat (ms) L-R Lat (ms) L Amp (V) R Amp (V) L-R Amp (%) Site1 Site2 L Vel (m/s) R Vel (m/s) L-R Vel (m/s)  Median Acr Palm Anti Sensory (2nd Digit)  32C  Wrist  3.6   26.1  Wrist Palm     Palm  1.7   9.3        Radial Anti Sensory (Base 1st Digit)  30.9C  Wrist  1.8   18.7  Wrist Base 1st Digit     Ulnar Anti Sensory (5th  Digit)  31.7C  Wrist  *4.4   *8.2  Wrist 5th Digit  *32    Motor Left/Right Comparison   Stim Site L Lat (ms) R Lat (ms) L-R Lat (ms) L Amp (mV) R Amp (mV) L-R Amp (%) Site1 Site2 L Vel (m/s) R Vel (m/s) L-R Vel (m/s)  Median Motor (Abd Poll Brev)  30.8C  Wrist  3.7   9.1  Elbow Wrist  53   Elbow  7.7   8.8        Ulnar Motor (Abd Dig Min)  31.1C  Wrist  4.1   *2.7  B Elbow Wrist  *41   B Elbow  9.5   0.5  A Elbow B Elbow  *33   A Elbow  12.8   1.9

## 2017-03-13 NOTE — Progress Notes (Signed)
[name] Short - 71 y.o. male MRN [id number]  Date of birth: [birth year]  Office Visit Note: Visit Date: 03/13/2017 PCP: Asencion Noble, MD Referred by: Asencion Noble, MD  Subjective: Chief Complaint  Patient presents with  . Right Hand - Numbness   HPI: [name] Short is a 71 year old gentleman status post right ulnar transposition. He is a right-hand dominant gentleman. He reports seeing Dr. Durward Fortes on 11/21/2016 for knee pain in the next day waking up with numbness tingling and pain in the right ulnar digits. He had an injection over this area and ultimately had transposition. The pain has resolved but he still having numbness tingling and weakness in the hand. The numbness and tingling is over the fifth digit and the ulnar half of the fourth digit. He has not had any radicular symptoms. He's had no specific injury. He's had prior lumbar surgery by Dr. Carloyn Manner. He has had no prior cervical surgery. He did not have any electrodiagnostic studies prior to the surgery.    ROS Otherwise per HPI.  Assessment & Plan: Visit Diagnoses:  1. Paresthesia of skin     Plan: No additional findings.  Impression: The above electrodiagnostic study is ABNORMAL and reveals evidence of a severe right ulnar nerve neuropathy at the elbow affecting sensory and motor components. This appears to be located at the level of the transposition. There is no conduction measured with stimulation at the groove. Stimulation above that area shows reduced amplitude. There is significant slowing of the nerve proximal to this. There is no slowing of the ulnar nerve across the wrist. There is needle EMG findings of axonal injury. Without prior electrodiagnostic study it's impossible to say if this is improved residual.: For it would provide a baseline to no nerve improvement.  There is no significant electrodiagnostic evidence of any other focal nerve entrapment, brachial plexopathy or cervical radiculopathy.   Recommendations: 1.   Follow-up with referring physician. 2.  Continue current management of symptoms. May wish to try neuropathic pain medication such as gabapentin, Lyrica, nortriptyline. 3.  Repeat studies in 3-4 months if symptoms are progressive or not resolved.   Meds & Orders: No orders of the defined types were placed in this encounter.   Orders Placed This Encounter  Procedures  . NCV with EMG (electromyography)    Follow-up: Return for Dr. Durward Fortes first available.   Procedures: No procedures performed  EMG & NCV Findings: Evaluation of the right ulnar motor nerve showed reduced amplitude (2.7 mV), decreased conduction velocity (B Elbow-Wrist, 41 m/s), and decreased conduction velocity (A Elbow-B Elbow, 33 m/s).  The right ulnar sensory nerve showed prolonged distal peak latency (4.4 ms), reduced amplitude (8.2 V), and decreased conduction velocity (Wrist-5th Digit, 32 m/s).  All remaining nerves (as indicated in the following tables) were within normal limits.    Needle evaluation of the right first dorsal interosseous muscle showed increased insertional activity, increased spontaneous activity, and diminished recruitment.  The right flexor digitorum profundus muscle showed increased insertional activity, slightly increased spontaneous activity, and diminished recruitment.  All remaining muscles (as indicated in the following table) showed no evidence of electrical instability.    Impression: The above electrodiagnostic study is ABNORMAL and reveals evidence of a severe right ulnar nerve neuropathy at the elbow affecting sensory and motor components. This appears to be located at the level of the transposition. There is no conduction measured with stimulation at the groove. Stimulation above that area shows reduced amplitude. There is significant slowing of  the nerve proximal to this. There is no slowing of the ulnar nerve across the wrist. There is needle EMG findings of axonal injury. Without prior  electrodiagnostic study it's impossible to say if this is improved residual.: For it would provide a baseline to no nerve improvement.  There is no significant electrodiagnostic evidence of any other focal nerve entrapment, brachial plexopathy or cervical radiculopathy.   Recommendations: 1.  Follow-up with referring physician. 2.  Continue current management of symptoms. May wish to try neuropathic pain medication such as gabapentin, Lyrica, nortriptyline. 3.  Repeat studies in 3-4 months if symptoms are progressive or not resolved.   Nerve Conduction Studies Anti Sensory Summary Table   Stim Site NR Peak (ms) Norm Peak (ms) P-T Amp (V) Norm P-T Amp Site1 Site2 Delta-P (ms) Dist (cm) Vel (m/s) Norm Vel (m/s)  Right Median Acr Palm Anti Sensory (2nd Digit)  32C  Wrist    3.6 <3.6 26.1 >10 Wrist Palm 1.9 0.0    Palm    1.7 <2.0 9.3         Right Radial Anti Sensory (Base 1st Digit)  30.9C  Wrist    1.8 <3.1 18.7  Wrist Base 1st Digit 1.8 0.0    Right Ulnar Anti Sensory (5th Digit)  31.7C  Wrist    *4.4 <3.7 *8.2 >15.0 Wrist 5th Digit 4.4 14.0 *32 >38   Motor Summary Table   Stim Site NR Onset (ms) Norm Onset (ms) O-P Amp (mV) Norm O-P Amp Site1 Site2 Delta-0 (ms) Dist (cm) Vel (m/s) Norm Vel (m/s)  Right Median Motor (Abd Poll Brev)  30.8C  Wrist    3.7 <4.2 9.1 >5 Elbow Wrist 4.0 21.0 53 >50  Elbow    7.7  8.8         Right Ulnar Motor (Abd Dig Min)  31.1C  Wrist    4.1 <4.2 *2.7 >3 B Elbow Wrist 5.4 22.0 *41 >53  B Elbow    9.5  0.5  A Elbow B Elbow 3.3 11.0 *33 >53  A Elbow    12.8  1.9          EMG   Side Muscle Nerve Root Ins Act Fibs Psw Amp Dur Poly Recrt Int Fraser Din Comment  Right Abd Poll Brev Median C8-T1 Nml Nml Nml Nml Nml 0 Nml Nml   Right 1stDorInt Ulnar C8-T1 *Incr *3+ *3+ Nml Nml 0 *Reduced Nml   Right PronatorTeres Median C6-7 Nml Nml Nml Nml Nml 0 Nml Nml   Right Biceps Musculocut C5-6 Nml Nml Nml Nml Nml 0 Nml Nml   Right Deltoid Axillary C5-6 Nml Nml Nml  Nml Nml 0 Nml Nml   Right FlexDigProf Ulnar C8,T1 *Incr *1+ *1+ Nml Nml 0 *Reduced Nml     Nerve Conduction Studies Anti Sensory Left/Right Comparison   Stim Site L Lat (ms) R Lat (ms) L-R Lat (ms) L Amp (V) R Amp (V) L-R Amp (%) Site1 Site2 L Vel (m/s) R Vel (m/s) L-R Vel (m/s)  Median Acr Palm Anti Sensory (2nd Digit)  32C  Wrist  3.6   26.1  Wrist Palm     Palm  1.7   9.3        Radial Anti Sensory (Base 1st Digit)  30.9C  Wrist  1.8   18.7  Wrist Base 1st Digit     Ulnar Anti Sensory (5th Digit)  31.7C  Wrist  *4.4   *8.2  Wrist 5th Digit  *32  Motor Left/Right Comparison   Stim Site L Lat (ms) R Lat (ms) L-R Lat (ms) L Amp (mV) R Amp (mV) L-R Amp (%) Site1 Site2 L Vel (m/s) R Vel (m/s) L-R Vel (m/s)  Median Motor (Abd Poll Brev)  30.8C  Wrist  3.7   9.1  Elbow Wrist  53   Elbow  7.7   8.8        Ulnar Motor (Abd Dig Min)  31.1C  Wrist  4.1   *2.7  B Elbow Wrist  *41   B Elbow  9.5   0.5  A Elbow B Elbow  *33   A Elbow  12.8   1.9              Clinical History: No specialty comments available.  He reports that he quit smoking about 8 years ago. His smoking use included Cigarettes. He has a 40.00 pack-year smoking history. He quit smokeless tobacco use about 31 years ago. His smokeless tobacco use included Chew. No results for input(s): HGBA1C, LABURIC in the last 8760 hours.  Objective:  VS:  HT:    WT:   BMI:     BP:   HR: bpm  TEMP: ( )  RESP:  Physical Exam  Musculoskeletal:  Inspection reveals well-healed ulnar nerve transposition surgical scar at the medial elbow and no atrophy of the bilateral APB or FDI or hand intrinsics. There is a positive Benedictine sign negative Wartenberg or clawing. There is no swelling, color changes, allodynia or dystrophic changes. There is 5 out of 5 strength in the bilateral wrist extension, finger abduction and long finger flexion. There is decreased sensation to light touch in the ulnar nerve distribution on the right.  There is a positive Froment's test on the right.     Ortho Exam Imaging: No results found.  Past Medical/Family/Surgical/Social History: Medications & Allergies reviewed per EMR Patient Active Problem List   Diagnosis Date Noted  . Organic erectile dysfunction 08/11/2012  . Cancer of left lung ([place]) 04/27/2012   Past Medical History:  Diagnosis Date  . Arthritis    ESP IN HANDS  . Cancer ([place])    OCCULAR RIGHT EYE--SURGERY AND CHEMO  PT IS LEGALLY BLIND IN RT EYE  . Cataract    LEFT EYE  . COPD (chronic obstructive pulmonary disease) ([place])    FORMER SMOKER  . Erectile dysfunction   . GERD (gastroesophageal reflux disease)   . Glaucoma    ONLY IN RIGHT EYE  . Hypertension   . Lung cancer ([place])    lung ca dx 02/2010  . Pneumonia 2011  . Prostate cancer [facility])    prostate ca dx 11/11  . Shortness of breath    ONLY WITH EXERTION; HX OF LOBECTOMY FOR CANER   Family History  Problem Relation Age of Onset  . Colon polyps Brother   . Colon polyps Brother   . Colon polyps Brother   . Colon polyps Brother    Past Surgical History:  Procedure Laterality Date  . APPENDECTOMY  Short 2000  . COLONOSCOPY  08/06/2012   Procedure: COLONOSCOPY;  Surgeon: Rogene Houston, MD;  [facility];  Service: Endoscopy;  Laterality: N/A;  955  . COLONOSCOPY N/A 08/08/2015   Procedure: COLONOSCOPY;  Surgeon: Rogene Houston, MD;  Location[facility];  Service: Endoscopy;  Laterality: N/A;  1200  . ELBOW SURGERY    . EYE SURGERY     OCT 1999 DETACHED RETINA -RIGHT;  MAY AND Short 2009 RIGHT EYE SURGERY FOR CANCER-LENS HAS BEEN REMOVED.  [name] LEFT UPPER LOBECTOMY  03/2010   FOR CANCER  . PENILE PROSTHESIS IMPLANT  08/10/2012   Procedure: PENILE PROTHESIS INFLATABLE;  Surgeon: Malka So, MD;  Location: [facility];  Service: Urology;  Laterality: N/A;  IMPLANT 3 PIECE PENILE PROSTHESIS   . PROSTATECTOMY  DEC 2011   FOR CANCER   Social History   Occupational History  . Not on file.    Social History Main Topics  . Smoking status: Former Smoker    Packs/day: 1.00    Years: 40.00    Types: Cigarettes    Quit date: 09/07/2008  . Smokeless tobacco: Former Systems developer    Types: Chew    Quit date: 10/08/1985  . Alcohol use No  . Drug use: No  . Sexual activity: Not on file

## 2017-03-13 NOTE — Progress Notes (Deleted)
Patient states he is no longer having  any pain but still is having constant numbness and tingling in fingers.

## 2017-03-20 ENCOUNTER — Ambulatory Visit (INDEPENDENT_AMBULATORY_CARE_PROVIDER_SITE_OTHER): Payer: Medicare Other | Admitting: Urology

## 2017-03-20 DIAGNOSIS — G4733 Obstructive sleep apnea (adult) (pediatric): Secondary | ICD-10-CM | POA: Diagnosis not present

## 2017-03-20 DIAGNOSIS — Z6829 Body mass index (BMI) 29.0-29.9, adult: Secondary | ICD-10-CM | POA: Diagnosis not present

## 2017-03-20 DIAGNOSIS — Z8546 Personal history of malignant neoplasm of prostate: Secondary | ICD-10-CM

## 2017-03-20 DIAGNOSIS — E785 Hyperlipidemia, unspecified: Secondary | ICD-10-CM | POA: Diagnosis not present

## 2017-03-20 DIAGNOSIS — N5201 Erectile dysfunction due to arterial insufficiency: Secondary | ICD-10-CM | POA: Diagnosis not present

## 2017-03-20 DIAGNOSIS — I10 Essential (primary) hypertension: Secondary | ICD-10-CM | POA: Diagnosis not present

## 2017-03-20 DIAGNOSIS — N393 Stress incontinence (female) (male): Secondary | ICD-10-CM | POA: Diagnosis not present

## 2017-04-03 ENCOUNTER — Ambulatory Visit (INDEPENDENT_AMBULATORY_CARE_PROVIDER_SITE_OTHER): Payer: Medicare Other | Admitting: Orthopaedic Surgery

## 2017-04-03 ENCOUNTER — Encounter (INDEPENDENT_AMBULATORY_CARE_PROVIDER_SITE_OTHER): Payer: Self-pay | Admitting: Orthopaedic Surgery

## 2017-04-03 VITALS — BP 131/67 | HR 92 | Resp 14 | Ht 68.0 in | Wt 185.0 lb

## 2017-04-03 DIAGNOSIS — G5621 Lesion of ulnar nerve, right upper limb: Secondary | ICD-10-CM

## 2017-04-03 DIAGNOSIS — M1712 Unilateral primary osteoarthritis, left knee: Secondary | ICD-10-CM

## 2017-04-03 NOTE — Progress Notes (Signed)
Office Visit Note   Patient: [name] Short           Date of Birth: 1946/02/25           MRN: [id number] Visit Date: 04/03/2017              Requested by: Asencion Noble, MD [address] [place], [place] [address] PCP: Asencion Noble, MD   Assessment & Plan: Visit Diagnoses:  1. Ulnar nerve compression, right   2. Unilateral primary osteoarthritis, left knee   Dr. Ernestina Patches performed EMGs nerve conduction studies to the right upper extremity demonstrating severe right ulnar nerve neuropathy at the elbow affecting sensory and motor components. I believe this would explain his persistent numbness in 2 fingers have the decompression. He's really not having any pain. He also has evidence of degenerative arthritis medial compartment of his left knee that has responded well to cortisone   Plan: Continue to monitor the ulnar nerve the right elbow. It appears that he has an advancing Tinel's which would be a good indication of sensory return. He does not have any motor loss. Office 2 months  Follow-Up Instructions: Return in about 2 months (around 06/04/2017).   Orders:  No orders of the defined types were placed in this encounter.  No orders of the defined types were placed in this encounter.     Procedures: No procedures performed   Clinical Data: No additional findings.   Subjective: Chief Complaint  Patient presents with  . Right Hand - Pain, Results    [name] Short is a 71 y o that is here for results of his EMG test of r hand.   [name] Short is status post decompression of the ulnar nerve at the right elbow. He had significant relief of his pain postoperatively but still having tingling and numbness into the ulnar 2 digits. Motor exam has been normal. I ordered EMGs and nerve conduction studies. Dr. Ernestina Patches perform these on 629 demonstrating severe right ulnar nerve neuropathy at the elbow affecting both sensory and motor. I believe that would explain the reason why still having some  numbness. He also relates it is feeling better after the cortisone injection in his left knee. MRI scan demonstrated a small tear of the medial meniscus but fairly moderate degenerative arthrosis of the medial compartment  HPI  Review of Systems   Objective: Vital Signs: BP 131/67   Pulse 92   Resp 14   Ht 5\' 8"  (1.727 m)   Wt 185 lb (83.9 kg)   BMI 28.13 kg/m   Physical Exam  Ortho ExamPositive Tinel's along the ulnar nerve 3 inches distal to the medial epicondyle left elbow. Order exam appears to be intact. Still has some altered sensibility in the right little finger and ulnar side of the ring. Certainly has some degenerative changes in his hands. Full range of motion of his right elbow. No significant tenderness over the ulnar nerve. His old incision is healed along the ulnar nerve medial aspect of right elbow  Specialty Comments:  No specialty comments available.  Imaging: No results found.   PMFS History: Patient Active Problem List   Diagnosis Date Noted  . Organic erectile dysfunction 08/11/2012  . Cancer of left lung ([place]) 04/27/2012   Past Medical History:  Diagnosis Date  . Arthritis    ESP IN HANDS  . Cancer ([place])    OCCULAR RIGHT EYE--SURGERY AND CHEMO  PT IS LEGALLY BLIND IN RT EYE  . Cataract  LEFT EYE  . COPD (chronic obstructive pulmonary disease) ([place])    FORMER SMOKER  . Erectile dysfunction   . GERD (gastroesophageal reflux disease)   . Glaucoma    ONLY IN RIGHT EYE  . Hypertension   . Lung cancer ([place])    lung ca dx 02/2010  . Pneumonia 2011  . Prostate cancer [facility])    prostate ca dx 11/11  . Shortness of breath    ONLY WITH EXERTION; HX OF LOBECTOMY FOR CANER    Family History  Problem Relation Age of Onset  . Colon polyps Brother   . Colon polyps Brother   . Colon polyps Brother   . Colon polyps Brother     Past Surgical History:  Procedure Laterality Date  . APPENDECTOMY  JUNE 2000  . COLONOSCOPY  08/06/2012   Procedure:  COLONOSCOPY;  Surgeon: Rogene Houston, MD;  [facility];  Service: Endoscopy;  Laterality: N/A;  955  . COLONOSCOPY N/A 08/08/2015   Procedure: COLONOSCOPY;  Surgeon: Rogene Houston, MD;  [facility];  Service: Endoscopy;  Laterality: N/A;  1200  . ELBOW SURGERY    . EYE SURGERY     OCT 1999 DETACHED RETINA -RIGHT; MAY AND JUNE 2009 RIGHT EYE SURGERY FOR CANCER-LENS HAS BEEN REMOVED.  [name] LEFT UPPER LOBECTOMY  03/2010   FOR CANCER  . PENILE PROSTHESIS IMPLANT  08/10/2012   Procedure: PENILE PROTHESIS INFLATABLE;  Surgeon: Malka So, MD;  [facility];  Service: Urology;  Laterality: N/A;  IMPLANT 3 PIECE PENILE PROSTHESIS   . PROSTATECTOMY  DEC 2011   FOR CANCER   Social History   Occupational History  . Not on file.   Social History Main Topics  . Smoking status: Former Smoker    Packs/day: 1.00    Years: 40.00    Types: Cigarettes    Quit date: 09/07/2008  . Smokeless tobacco: Former Systems developer    Types: Chew    Quit date: 10/08/1985  . Alcohol use No  . Drug use: No  . Sexual activity: Not on file     Garald Balding, MD   Note - This record has been created using [employer].  Chart creation errors have been sought, but may not always  have been located. Such creation errors do not reflect on  the standard of medical care.

## 2017-04-10 DIAGNOSIS — D075 Carcinoma in situ of prostate: Secondary | ICD-10-CM | POA: Diagnosis not present

## 2017-04-10 DIAGNOSIS — Z79899 Other long term (current) drug therapy: Secondary | ICD-10-CM | POA: Diagnosis not present

## 2017-04-10 DIAGNOSIS — E785 Hyperlipidemia, unspecified: Secondary | ICD-10-CM | POA: Diagnosis not present

## 2017-04-10 DIAGNOSIS — R7301 Impaired fasting glucose: Secondary | ICD-10-CM | POA: Diagnosis not present

## 2017-04-10 DIAGNOSIS — I10 Essential (primary) hypertension: Secondary | ICD-10-CM | POA: Diagnosis not present

## 2017-06-22 DIAGNOSIS — L237 Allergic contact dermatitis due to plants, except food: Secondary | ICD-10-CM | POA: Diagnosis not present

## 2017-06-22 DIAGNOSIS — Z23 Encounter for immunization: Secondary | ICD-10-CM | POA: Diagnosis not present

## 2017-07-08 DIAGNOSIS — M25562 Pain in left knee: Secondary | ICD-10-CM | POA: Diagnosis not present

## 2017-07-08 DIAGNOSIS — M170 Bilateral primary osteoarthritis of knee: Secondary | ICD-10-CM | POA: Diagnosis not present

## 2017-07-24 DIAGNOSIS — G8929 Other chronic pain: Secondary | ICD-10-CM | POA: Diagnosis not present

## 2017-07-24 DIAGNOSIS — M170 Bilateral primary osteoarthritis of knee: Secondary | ICD-10-CM | POA: Diagnosis not present

## 2017-07-24 DIAGNOSIS — M25562 Pain in left knee: Secondary | ICD-10-CM | POA: Diagnosis not present

## 2017-07-27 DIAGNOSIS — M25562 Pain in left knee: Secondary | ICD-10-CM | POA: Diagnosis not present

## 2017-07-27 DIAGNOSIS — G8929 Other chronic pain: Secondary | ICD-10-CM | POA: Diagnosis not present

## 2017-07-31 DIAGNOSIS — H2512 Age-related nuclear cataract, left eye: Secondary | ICD-10-CM | POA: Diagnosis not present

## 2017-07-31 DIAGNOSIS — H338 Other retinal detachments: Secondary | ICD-10-CM | POA: Diagnosis not present

## 2017-08-03 DIAGNOSIS — M170 Bilateral primary osteoarthritis of knee: Secondary | ICD-10-CM | POA: Diagnosis not present

## 2017-08-12 ENCOUNTER — Ambulatory Visit: Payer: Self-pay | Admitting: Orthopedic Surgery

## 2017-08-12 DIAGNOSIS — G8929 Other chronic pain: Secondary | ICD-10-CM | POA: Diagnosis not present

## 2017-08-12 DIAGNOSIS — M25562 Pain in left knee: Secondary | ICD-10-CM | POA: Diagnosis not present

## 2017-09-01 ENCOUNTER — Ambulatory Visit: Payer: Self-pay | Admitting: Orthopedic Surgery

## 2017-09-01 NOTE — H&P (View-Only) (Signed)
[name] Short DOB: 03-26-1946 Married / Language: Cleophus Molt / Race: White Male  H&P Date: 09/01/17  Chief Complaint: Left knee pain  History of Present Illness The patient is a 71 year old male who comes in today for a preoperative History and Physical. The patient is scheduled for a left total knee arthroplasty to be performed by Dr. Johnn Hai, MD at [facility] on 09/10/17. Pt reports chronic progressively worsening pain refractory to conservative tx and interfering with ADLs and quality of life at this point. Reports significant weightbearing pain as well as stiffness about the knee, accompanied by swelling.  Dr. Tonita Cong and the patient mutually agreed to proceed with a left total knee replacement. Risks and benefits of the procedure were discussed including stiffness, suboptimal range of motion, persistent pain, infection requiring removal of prosthesis and reinsertion, need for prophylactic antibiotics in the future, for example, dental procedures, possible need for manipulation, revision in the future and also anesthetic complications including DVT, PE, etc. We discussed the perioperative course, time in the hospital, postoperative recovery and the need for elevation to control swelling. We also discussed the predicted range of motion and the probability that squatting and kneeling would be unobtainable in the future. In addition, postoperative anticoagulation was discussed. We have obtained preoperative medical clearance as necessary. Provided illustrated handout and discussed it in detail. They will enroll in the total joint replacement educational forum at the hospital.  Problem List/Past Medical Hx Acute midline low back pain with left-sided sciatica (M54.42)  Primary osteoarthritis of both knees (M17.0)  Chronic pain of left knee (M25.562)  Cancer  Chronic Pain  Gastroesophageal Reflux Disease  High blood pressure  Lung Cancer  Other disease, cancer, significant  illness  Prostate Cancer   Allergies Silicon *CHEMICALS*   Family History Cancer  mother and grandmother mothers side Depression  child Diabetes Mellitus  sister Drug / Alcohol Addiction  brother Heart Disease  brother and grandmother mothers side Heart disease in male family member before age 81  Hypertension  child Rheumatoid Arthritis  mother Severe allergy  child First Degree Relatives   Social History Tobacco use  former smoker; smoke(d) 1 pack(s) per day Tobacco / smoke exposure  yes outdoors only Alcohol use  current drinker; drinks beer and hard liquor; only occasionally per week Children  3 Current work status  working full time Drug/Alcohol Rehab (Currently)  no Drug/Alcohol Rehab (Previously)  no Exercise  Exercises weekly; does running / walking Illicit drug use  no Living situation  live with spouse Marital status  married Number of flights of stairs before winded  2-3 Pain Contract  no  Medication History Tylenol (325MG  Tablet, Oral) Active. Aspirin (81MG  Tablet Chewable, Oral) Active. Multivitamin Adult (Oral) Active. Brimonidine Tartrate (0.15% Solution, Ophthalmic) Active. (qd right eye) Alphagan P (0.15% Solution, Ophthalmic) Active. (right eye qd) Losartan Potassium (100MG  Tablet, Oral) Active. (qd) NIFEdipine ER Osmotic Release (90MG  Tablet ER 24HR, Oral) Active. (qd) Chlorthalidone (25MG  Tablet, Oral) Active. (qd) Omeprazole (20MG  Tablet DR, Oral) Active. (qd)  Past Surgical History  Appendectomy  Colon Polyp Removal - Colonoscopy  Lung Surgery  left Other Orthopaedic Surgery  Prostatectomy; Abdominal  Straighten Nasal Septum  Vasectomy   Review of Systems  General Not Present- Chills, Fatigue, Fever, Memory Loss, Night Sweats, Weight Gain and Weight Loss. Skin Not Present- Eczema, Hives, Itching, Lesions and Rash. HEENT Not Present- Dentures, Double Vision, Headache, Hearing Loss, Tinnitus and  Visual Loss. Respiratory Not Present- Allergies, Chronic Cough,  Coughing up blood, Shortness of breath at rest and Shortness of breath with exertion. Cardiovascular Not Present- Chest Pain, Difficulty Breathing Lying Down, Murmur, Palpitations, Racing/skipping heartbeats and Swelling. Gastrointestinal Not Present- Abdominal Pain, Bloody Stool, Constipation, Diarrhea, Difficulty Swallowing, Heartburn, Jaundice, Loss of appetitie, Nausea and Vomiting. Male Genitourinary Not Present- Blood in Urine, Discharge, Flank Pain, Incontinence, Painful Urination, Urgency, Urinary frequency, Urinary Retention, Urinating at Night and Weak urinary stream. Musculoskeletal Present- Joint Pain, Joint Swelling and Morning Stiffness. Not Present- Back Pain, Muscle Pain, Muscle Weakness and Spasms. Neurological Not Present- Blackout spells, Difficulty with balance, Dizziness, Paralysis, Tremor and Weakness. Psychiatric Not Present- Insomnia.  Physical Exam General Mental Status -Alert, cooperative and good historian. General Appearance-pleasant, Not in acute distress. Orientation-Oriented X3. Build & Nutrition-Well nourished and Well developed.  Head and Neck Head-normocephalic, atraumatic . Neck Global Assessment - supple, no bruit auscultated on the right, no bruit auscultated on the left.  Eye Pupil - Bilateral-Regular and Round. Motion - Bilateral-EOMI.  Chest and Lung Exam Auscultation Breath sounds - clear at anterior chest wall and clear at posterior chest wall. Adventitious sounds - No Adventitious sounds.  Cardiovascular Auscultation Rhythm - Regular rate and rhythm. Heart Sounds - S1 WNL and S2 WNL. Murmurs & Other Heart Sounds - Auscultation of the heart reveals - No Murmurs.  Abdomen Palpation/Percussion Tenderness - Abdomen is non-tender to palpation. Rigidity (guarding) - Abdomen is soft. Auscultation Auscultation of the abdomen reveals - Bowel sounds normal.  Male  Genitourinary Not done, not pertinent to present illness  Musculoskeletal Healthy. Mild discomfort. Walks in antalgic gait. Exquisitely tender in the left medial joint line over the proximal tibia and medial joint line. He has no McMurray. He has a trace effusion. He has patellofemoral pain with compression. Range 0-140 no instability of slightly hip and ankle exams unremarkable. Quad is 5/5. Sensory exam is intact. No DVT.  Imaging AP standing lateral demonstrates essentially severe medial compartment narrowing on the left.  MRI was reviewed demonstrate the edema in the proximal tibia subchondral cyst in the full-thickness chondral lesions.  Assessment & Plan Primary osteoarthritis of both knees (M17.0)  Pt with end-stage left knee DJD, bone-on-bone, refractory to conservative tx, scheduled for left total knee replacement by Dr. Tonita Cong on 09/10/17. We again discussed the procedure itself as well as risks, complications and alternatives, including but not limited to DVT, PE, infx, bleeding, failure of procedure, need for secondary procedure including manipulation, nerve injury, ongoing pain/symptoms, anesthesia risk, even stroke or death. Also discussed typical post-op protocols, activity restrictions, need for PT, flexion/extension exercises, time out of work. Discussed need for DVT ppx post-op per protocol. Discussed dental ppx and infx prevention. Also discussed limitations post-operatively such as kneeling and squatting. All questions were answered. Patient desires to proceed with surgery as scheduled. Will hold supplements, ASA and NSAIDs accordingly. Will remain NPO after MN night before surgery. Has Rx for MRSA decolonization. Denies UTI symptoms currently but will check UA- Will [facility] for pre-op testing. Anticipate hospital stay to include at least 2 midnights given medical history and to ensure proper pain control. Plan ASA 325mg  BID for DVT ppx post-op, no hx DVT. Plan Percocet,  Colace, Miralax. Plan d/c home post-op with family members at home for assistance and start outpt PT here at [facility] on 12/31 with Richardson Landry. Will follow up 10-14 days post-op for staple removal and xrays.  Plan L total knee replacement  Signed electronically by Cecilie Kicks, PA-C for Dr. Tonita Cong

## 2017-09-01 NOTE — H&P (Signed)
[name] Short DOB: 02/19/1946 Married / Language: Cleophus Molt / Race: White Male  H&P Date: 09/01/17  Chief Complaint: Left knee pain  History of Present Illness The patient is a 71 year old male who comes in today for a preoperative History and Physical. The patient is scheduled for a left total knee arthroplasty to be performed by Dr. Johnn Hai, MD at [facility] on 09/10/17. Pt reports chronic progressively worsening pain refractory to conservative tx and interfering with ADLs and quality of life at this point. Reports significant weightbearing pain as well as stiffness about the knee, accompanied by swelling.  Dr. Tonita Cong and the patient mutually agreed to proceed with a left total knee replacement. Risks and benefits of the procedure were discussed including stiffness, suboptimal range of motion, persistent pain, infection requiring removal of prosthesis and reinsertion, need for prophylactic antibiotics in the future, for example, dental procedures, possible need for manipulation, revision in the future and also anesthetic complications including DVT, PE, etc. We discussed the perioperative course, time in the hospital, postoperative recovery and the need for elevation to control swelling. We also discussed the predicted range of motion and the probability that squatting and kneeling would be unobtainable in the future. In addition, postoperative anticoagulation was discussed. We have obtained preoperative medical clearance as necessary. Provided illustrated handout and discussed it in detail. They will enroll in the total joint replacement educational forum at the hospital.  Problem List/Past Medical Hx Acute midline low back pain with left-sided sciatica (M54.42)  Primary osteoarthritis of both knees (M17.0)  Chronic pain of left knee (M25.562)  Cancer  Chronic Pain  Gastroesophageal Reflux Disease  High blood pressure  Lung Cancer  Other disease, cancer, significant  illness  Prostate Cancer   Allergies Silicon *CHEMICALS*   Family History Cancer  mother and grandmother mothers side Depression  child Diabetes Mellitus  sister Drug / Alcohol Addiction  brother Heart Disease  brother and grandmother mothers side Heart disease in male family member before age 76  Hypertension  child Rheumatoid Arthritis  mother Severe allergy  child First Degree Relatives   Social History Tobacco use  former smoker; smoke(d) 1 pack(s) per day Tobacco / smoke exposure  yes outdoors only Alcohol use  current drinker; drinks beer and hard liquor; only occasionally per week Children  3 Current work status  working full time Drug/Alcohol Rehab (Currently)  no Drug/Alcohol Rehab (Previously)  no Exercise  Exercises weekly; does running / walking Illicit drug use  no Living situation  live with spouse Marital status  married Number of flights of stairs before winded  2-3 Pain Contract  no  Medication History Tylenol (325MG  Tablet, Oral) Active. Aspirin (81MG  Tablet Chewable, Oral) Active. Multivitamin Adult (Oral) Active. Brimonidine Tartrate (0.15% Solution, Ophthalmic) Active. (qd right eye) Alphagan P (0.15% Solution, Ophthalmic) Active. (right eye qd) Losartan Potassium (100MG  Tablet, Oral) Active. (qd) NIFEdipine ER Osmotic Release (90MG  Tablet ER 24HR, Oral) Active. (qd) Chlorthalidone (25MG  Tablet, Oral) Active. (qd) Omeprazole (20MG  Tablet DR, Oral) Active. (qd)  Past Surgical History  Appendectomy  Colon Polyp Removal - Colonoscopy  Lung Surgery  left Other Orthopaedic Surgery  Prostatectomy; Abdominal  Straighten Nasal Septum  Vasectomy   Review of Systems  General Not Present- Chills, Fatigue, Fever, Memory Loss, Night Sweats, Weight Gain and Weight Loss. Skin Not Present- Eczema, Hives, Itching, Lesions and Rash. HEENT Not Present- Dentures, Double Vision, Headache, Hearing Loss, Tinnitus and  Visual Loss. Respiratory Not Present- Allergies, Chronic Cough,  Coughing up blood, Shortness of breath at rest and Shortness of breath with exertion. Cardiovascular Not Present- Chest Pain, Difficulty Breathing Lying Down, Murmur, Palpitations, Racing/skipping heartbeats and Swelling. Gastrointestinal Not Present- Abdominal Pain, Bloody Stool, Constipation, Diarrhea, Difficulty Swallowing, Heartburn, Jaundice, Loss of appetitie, Nausea and Vomiting. Male Genitourinary Not Present- Blood in Urine, Discharge, Flank Pain, Incontinence, Painful Urination, Urgency, Urinary frequency, Urinary Retention, Urinating at Night and Weak urinary stream. Musculoskeletal Present- Joint Pain, Joint Swelling and Morning Stiffness. Not Present- Back Pain, Muscle Pain, Muscle Weakness and Spasms. Neurological Not Present- Blackout spells, Difficulty with balance, Dizziness, Paralysis, Tremor and Weakness. Psychiatric Not Present- Insomnia.  Physical Exam General Mental Status -Alert, cooperative and good historian. General Appearance-pleasant, Not in acute distress. Orientation-Oriented X3. Build & Nutrition-Well nourished and Well developed.  Head and Neck Head-normocephalic, atraumatic . Neck Global Assessment - supple, no bruit auscultated on the right, no bruit auscultated on the left.  Eye Pupil - Bilateral-Regular and Round. Motion - Bilateral-EOMI.  Chest and Lung Exam Auscultation Breath sounds - clear at anterior chest wall and clear at posterior chest wall. Adventitious sounds - No Adventitious sounds.  Cardiovascular Auscultation Rhythm - Regular rate and rhythm. Heart Sounds - S1 WNL and S2 WNL. Murmurs & Other Heart Sounds - Auscultation of the heart reveals - No Murmurs.  Abdomen Palpation/Percussion Tenderness - Abdomen is non-tender to palpation. Rigidity (guarding) - Abdomen is soft. Auscultation Auscultation of the abdomen reveals - Bowel sounds normal.  Male  Genitourinary Not done, not pertinent to present illness  Musculoskeletal Healthy. Mild discomfort. Walks in antalgic gait. Exquisitely tender in the left medial joint line over the proximal tibia and medial joint line. He has no McMurray. He has a trace effusion. He has patellofemoral pain with compression. Range 0-140 no instability of slightly hip and ankle exams unremarkable. Quad is 5/5. Sensory exam is intact. No DVT.  Imaging AP standing lateral demonstrates essentially severe medial compartment narrowing on the left.  MRI was reviewed demonstrate the edema in the proximal tibia subchondral cyst in the full-thickness chondral lesions.  Assessment & Plan Primary osteoarthritis of both knees (M17.0)  Pt with end-stage left knee DJD, bone-on-bone, refractory to conservative tx, scheduled for left total knee replacement by Dr. Tonita Cong on 09/10/17. We again discussed the procedure itself as well as risks, complications and alternatives, including but not limited to DVT, PE, infx, bleeding, failure of procedure, need for secondary procedure including manipulation, nerve injury, ongoing pain/symptoms, anesthesia risk, even stroke or death. Also discussed typical post-op protocols, activity restrictions, need for PT, flexion/extension exercises, time out of work. Discussed need for DVT ppx post-op per protocol. Discussed dental ppx and infx prevention. Also discussed limitations post-operatively such as kneeling and squatting. All questions were answered. Patient desires to proceed with surgery as scheduled. Will hold supplements, ASA and NSAIDs accordingly. Will remain NPO after MN night before surgery. Has Rx for MRSA decolonization. Denies UTI symptoms currently but will check UA- Will present to [facility] for pre-op testing. Anticipate hospital stay to include at least 2 midnights given medical history and to ensure proper pain control. Plan ASA 325mg  BID for DVT ppx post-op, no hx DVT. Plan Percocet,  Colace, Miralax. Plan d/c home post-op with family members at home for assistance and start outpt PT here at [facility] on 12/31 with Richardson Landry. Will follow up 10-14 days post-op for staple removal and xrays.  Plan L total knee replacement  Signed electronically by Cecilie Kicks, PA-C for Dr. Tonita Cong

## 2017-09-02 ENCOUNTER — Other Ambulatory Visit (HOSPITAL_COMMUNITY): Payer: Self-pay | Admitting: Emergency Medicine

## 2017-09-02 NOTE — Patient Instructions (Signed)
[name] Short  09/02/2017   Your procedure is scheduled on: 09-10-17  Report to [facility] Main  Entrance Take [place]  elevators to 3rd floor to  [place] at 5:30AM.    Call this number if you have problems the morning of surgery [phone]    Remember: ONLY 1 PERSON MAY GO WITH YOU TO SHORT STAY TO GET  READY MORNING OF YOUR SURGERY.  Do not eat food or drink liquids :After Midnight.     Take these medicines the morning of surgery with A SIP OF WATER: nifedipine(procartia), omeprazole, tylenol if needed                                You may not have any metal on your body including hair pins and              piercings  Do not wear jewelry, make-up, lotions, powders or perfumes, deodorant                         Men may shave face and neck.   Do not bring valuables to the hospital. [place].  Contacts, dentures or bridgework may not be worn into surgery.  Leave suitcase in the car. After surgery it may be brought to your room.                 Please read over the following fact sheets you were given: _____________________________________________________________________             [facility] - Preparing for Surgery Before surgery, you can play an important role.  Because skin is not sterile, your skin needs to be as free of germs as possible.  You can reduce the number of germs on your skin by washing with CHG (chlorahexidine gluconate) soap before surgery.  CHG is an antiseptic cleaner which kills germs and bonds with the skin to continue killing germs even after washing. Please DO NOT use if you have an allergy to CHG or antibacterial soaps.  If your skin becomes reddened/irritated stop using the CHG and inform your nurse when you arrive at Short Stay. Do not shave (including legs and underarms) for at least 48 hours prior to the first CHG shower.  You may shave your face/neck. Please follow  these instructions carefully:  1.  Shower with CHG Soap the night before surgery and the  morning of Surgery.  2.  If you choose to wash your hair, wash your hair first as usual with your  normal  shampoo.  3.  After you shampoo, rinse your hair and body thoroughly to remove the  shampoo.                           4.  Use CHG as you would any other liquid soap.  You can apply chg directly  to the skin and wash                       Gently with a scrungie or clean washcloth.  5.  Apply the CHG Soap to your body ONLY FROM THE NECK DOWN.   Do  not use on face/ open                           Wound or open sores. Avoid contact with eyes, ears mouth and genitals (private parts).                       Wash face,  Genitals (private parts) with your normal soap.             6.  Wash thoroughly, paying special attention to the area where your surgery  will be performed.  7.  Thoroughly rinse your body with warm water from the neck down.  8.  DO NOT shower/wash with your normal soap after using and rinsing off  the CHG Soap.                9.  Pat yourself dry with a clean towel.            10.  Wear clean pajamas.            11.  Place clean sheets on your bed the night of your first shower and do not  sleep with pets. Day of Surgery : Do not apply any lotions/deodorants the morning of surgery.  Please wear clean clothes to the hospital/surgery center.  FAILURE TO FOLLOW THESE INSTRUCTIONS MAY RESULT IN THE CANCELLATION OF YOUR SURGERY PATIENT SIGNATURE_________________________________  NURSE SIGNATURE__________________________________  ________________________________________________________________________   [name] Short  An incentive spirometer is a tool that can help keep your lungs clear and active. This tool measures how well you are filling your lungs with each breath. Taking long deep breaths may help reverse or decrease the chance of developing breathing (pulmonary) problems  (especially infection) following:  A long period of time when you are unable to move or be active. BEFORE THE PROCEDURE   If the spirometer includes an indicator to show your best effort, your nurse or respiratory therapist will set it to a desired goal.  If possible, sit up straight or lean slightly forward. Try not to slouch.  Hold the incentive spirometer in an upright position. INSTRUCTIONS FOR USE  1. Sit on the edge of your bed if possible, or sit up as far as you can in bed or on a chair. 2. Hold the incentive spirometer in an upright position. 3. Breathe out normally. 4. Place the mouthpiece in your mouth and seal your lips tightly around it. 5. Breathe in slowly and as deeply as possible, raising the piston or the ball toward the top of the column. 6. Hold your breath for 3-5 seconds or for as long as possible. Allow the piston or ball to fall to the bottom of the column. 7. Remove the mouthpiece from your mouth and breathe out normally. 8. Rest for a few seconds and repeat Steps 1 through 7 at least 10 times every 1-2 hours when you are awake. Take your time and take a few normal breaths between deep breaths. 9. The spirometer may include an indicator to show your best effort. Use the indicator as a goal to work toward during each repetition. 10. After each set of 10 deep breaths, practice coughing to be sure your lungs are clear. If you have an incision (the cut made at the time of surgery), support your incision when coughing by placing a pillow or rolled up towels firmly against it. Once you are able to get out of bed,  walk around indoors and cough well. You may stop using the incentive spirometer when instructed by your caregiver.  RISKS AND COMPLICATIONS  Take your time so you do not get dizzy or light-headed.  If you are in pain, you may need to take or ask for pain medication before doing incentive spirometry. It is harder to take a deep breath if you are having  pain. AFTER USE  Rest and breathe slowly and easily.  It can be helpful to keep track of a log of your progress. Your caregiver can provide you with a simple table to help with this. If you are using the spirometer at home, follow these instructions: [place] IF:   You are having difficultly using the spirometer.  You have trouble using the spirometer as often as instructed.  Your pain medication is not giving enough relief while using the spirometer.  You develop fever of 100.5 F (38.1 C) or higher. SEEK IMMEDIATE MEDICAL CARE IF:   You cough up bloody sputum that had not been present before.  You develop fever of 102 F (38.9 C) or greater.  You develop worsening pain at or near the incision site. MAKE SURE YOU:   Understand these instructions.  Will watch your condition.  Will get help right away if you are not doing well or get worse. Document Released: 01/12/2007 Document Revised: 11/24/2011 Document Reviewed: 03/15/2007 [facility] Patient Information 2014 [place], [place].   ________________________________________________________________________

## 2017-09-03 ENCOUNTER — Encounter (HOSPITAL_COMMUNITY): Payer: Self-pay

## 2017-09-03 ENCOUNTER — Other Ambulatory Visit: Payer: Self-pay

## 2017-09-03 ENCOUNTER — Encounter (HOSPITAL_COMMUNITY)
Admission: RE | Admit: 2017-09-03 | Discharge: 2017-09-03 | Disposition: A | Discharge status: Home / Self Care | Payer: Medicare Other | Source: Ambulatory Visit | Attending: Specialist | Admitting: Specialist

## 2017-09-03 DIAGNOSIS — H2701 Aphakia, right eye: Secondary | ICD-10-CM | POA: Diagnosis not present

## 2017-09-03 DIAGNOSIS — H2512 Age-related nuclear cataract, left eye: Secondary | ICD-10-CM | POA: Diagnosis not present

## 2017-09-03 DIAGNOSIS — Z01812 Encounter for preprocedural laboratory examination: Secondary | ICD-10-CM | POA: Diagnosis not present

## 2017-09-03 DIAGNOSIS — I10 Essential (primary) hypertension: Secondary | ICD-10-CM | POA: Diagnosis not present

## 2017-09-03 DIAGNOSIS — Z01810 Encounter for preprocedural cardiovascular examination: Secondary | ICD-10-CM | POA: Insufficient documentation

## 2017-09-03 DIAGNOSIS — H179 Unspecified corneal scar and opacity: Secondary | ICD-10-CM | POA: Diagnosis not present

## 2017-09-03 DIAGNOSIS — M1712 Unilateral primary osteoarthritis, left knee: Secondary | ICD-10-CM | POA: Diagnosis not present

## 2017-09-03 DIAGNOSIS — Z8669 Personal history of other diseases of the nervous system and sense organs: Secondary | ICD-10-CM | POA: Diagnosis not present

## 2017-09-03 DIAGNOSIS — H5000 Unspecified esotropia: Secondary | ICD-10-CM | POA: Diagnosis not present

## 2017-09-03 HISTORY — DX: Paresthesia of skin: R20.2

## 2017-09-03 HISTORY — DX: Pain in unspecified limb: M79.609

## 2017-09-03 LAB — BASIC METABOLIC PANEL
ANION GAP: 8 (ref 5–15)
BUN: 31 mg/dL — ABNORMAL HIGH (ref 6–20)
CHLORIDE: 106 mmol/L (ref 101–111)
CO2: 24 mmol/L (ref 22–32)
Calcium: 9.6 mg/dL (ref 8.9–10.3)
Creatinine, Ser: 1.35 mg/dL — ABNORMAL HIGH (ref 0.61–1.24)
GFR calc Af Amer: 60 mL/min — ABNORMAL LOW (ref >60)
GFR, EST NON AFRICAN AMERICAN: 52 mL/min — AB (ref >60)
Glucose, Bld: 106 mg/dL — ABNORMAL HIGH (ref 65–99)
POTASSIUM: 5 mmol/L (ref 3.5–5.1)
SODIUM: 138 mmol/L (ref 135–145)

## 2017-09-03 LAB — URINALYSIS, ROUTINE W REFLEX MICROSCOPIC
Bilirubin Urine: NEGATIVE
Glucose, UA: NEGATIVE mg/dL
Hgb urine dipstick: NEGATIVE
Ketones, ur: NEGATIVE mg/dL
LEUKOCYTES UA: NEGATIVE
NITRITE: NEGATIVE
PROTEIN: NEGATIVE mg/dL
SPECIFIC GRAVITY, URINE: 1.006 (ref 1.005–1.030)
pH: 6 (ref 5.0–8.0)

## 2017-09-03 LAB — CBC
HEMATOCRIT: 38.1 % — AB (ref 39.0–52.0)
HEMOGLOBIN: 13.4 g/dL (ref 13.0–17.0)
MCH: 32.6 pg (ref 26.0–34.0)
MCHC: 35.2 g/dL (ref 30.0–36.0)
MCV: 92.7 fL (ref 78.0–100.0)
Platelets: 349 10*3/uL (ref 150–400)
RBC: 4.11 MIL/uL — ABNORMAL LOW (ref 4.22–5.81)
RDW: 13.2 % (ref 11.5–15.5)
WBC: 7.6 10*3/uL (ref 4.0–10.5)

## 2017-09-03 LAB — PROTIME-INR
INR: 0.9
Prothrombin Time: 12.1 seconds (ref 11.4–15.2)

## 2017-09-03 LAB — APTT: APTT: 30 s (ref 24–36)

## 2017-09-03 LAB — SURGICAL PCR SCREEN
MRSA, PCR: NEGATIVE
STAPHYLOCOCCUS AUREUS: NEGATIVE

## 2017-09-03 NOTE — Progress Notes (Addendum)
PER PATIENT REQUEST, RN CALLED AND REQUESTED LAST EKG FROM PCP DR Asencion Noble OFFICE.   EDIT: EKG RECEIVED DATED 03-30-17 FROM DR Asencion Noble OFFICE ., NEW EKG PERFORMED TODAY WITH CLEARER WAVEFORMS . BOTH EKGS  PLACED IN PATIENT CHART

## 2017-09-03 NOTE — Progress Notes (Signed)
BMP routed via epic to Dr Tonita Cong

## 2017-09-04 ENCOUNTER — Encounter (HOSPITAL_COMMUNITY): Payer: Self-pay

## 2017-09-04 NOTE — Progress Notes (Signed)
Spoke with [name] Short at [facility] ortho; received update that patient has scheduled Cardiology appt on 09-07-17 with [facility] [place]. Per [name] Short, patient aware of appt. RN will follow up for any cardiac clearance notice

## 2017-09-04 NOTE — Progress Notes (Signed)
At pre-admit appt, patient reports hx of LOC, chest tightness,and diaphoresis 10 years ago at which time , patient states he saw heart specialist at [facility]  that sent him "for tests". RN questioned if patient remembers what sort of tests , suggesting ECHO , Stress, Heart cath as possibilities. Patient was unable to recall name of tests , but reported " they were normal". RN questioned of recurrence of symptoms since that time, patient reported he experiences similar symptoms "every once in a while but it goes away and then I'm fine".  Medication list includes omeprazole; RN questioned if sx occur after a meal or consumption of alcohol. Patient states he is "unsure" as he does not keep track when the sx happen. EKG was obtained at appt.    Today RN called and spoke with Caryl Pina at [facility] who was able to find patient in system but reports that record is too old and must be requested from [facility]" as a paper copy by this RN as they do not pull charts for [employer]. RN questioned how to request the paper record from [facility], Caryl Pina unaware and states she is doubtful that record would even be available by patients day of surgery d/t holiday season.   RN Spoke with Dr Roberts Gaudy in anesthesia for consult . Per Linna Caprice, since records are unavailable for review and patient endorses recurrence of sx, patient would need to be evaluated by cardiology to determine if further cardiac testing is necessary prior to surgery. Dr Linna Caprice made aware of the date of patient surgery and limited time frame d/t holiday season. Dr Linna Caprice advised this RN to contact surgeon office to inform of his recommendation and to give Dr Tonita Cong his hospital phone number to call for any questions. RN repeated recommendations to confirm understanding.   RN [facility] for Orson Slick , who returned call shortly; expressing concerning for possible cancellation as it would be difficult to coordinate cardiology exam in time for surgery . RN  again, reiterated anaesthesia's recommendation and  advised Judeen Hammans to have Dr Tonita Cong get in contact with Dr Linna Caprice to answer any questions.

## 2017-09-07 ENCOUNTER — Ambulatory Visit (INDEPENDENT_AMBULATORY_CARE_PROVIDER_SITE_OTHER): Payer: Medicare Other | Admitting: Cardiology

## 2017-09-07 ENCOUNTER — Encounter: Payer: Self-pay | Admitting: Cardiology

## 2017-09-07 VITALS — BP 140/72 | HR 81 | Ht 65.0 in | Wt 191.1 lb

## 2017-09-07 DIAGNOSIS — I10 Essential (primary) hypertension: Secondary | ICD-10-CM

## 2017-09-07 DIAGNOSIS — C3490 Malignant neoplasm of unspecified part of unspecified bronchus or lung: Secondary | ICD-10-CM

## 2017-09-07 DIAGNOSIS — Z87891 Personal history of nicotine dependence: Secondary | ICD-10-CM | POA: Diagnosis not present

## 2017-09-07 DIAGNOSIS — C3492 Malignant neoplasm of unspecified part of left bronchus or lung: Secondary | ICD-10-CM

## 2017-09-07 DIAGNOSIS — Z01810 Encounter for preprocedural cardiovascular examination: Secondary | ICD-10-CM | POA: Diagnosis not present

## 2017-09-07 NOTE — Patient Instructions (Signed)
Medication Instructions:  Your physician recommends that you continue on your current medications as directed. Please refer to the Current Medication list given to you today.  Labwork: None  Testing/Procedures: Your physician has requested that you have a lexiscan myoview. For further information please visit [email or web address]. Please follow instruction sheet, as given.  Follow-Up: Your physician recommends that you schedule a follow-up appointment in: 3 months.  Any Other Special Instructions Will Be Listed Below (If Applicable).     If you need a refill on your cardiac medications before your next appointment, please call your pharmacy.

## 2017-09-07 NOTE — Progress Notes (Signed)
Cardiology Office Note:    Date:  09/07/2017   ID:  [name] Short, DOB [birth year], MRN [id number]  PCP:  Asencion Noble, MD  Cardiologist:  Jenean Lindau, MD   Referring MD: Asencion Noble, MD    ASSESSMENT:    1. Preop cardiovascular exam   2. Preoperative cardiovascular examination   3. Essential (primary) hypertension   4. Malignant neoplasm of left lung, unspecified part of lung ([place])   5. SCC of lung (small cell carcinoma) (HCC)    PLAN:    In order of problems listed above:  1. I discussed my findings with the patient at extensive length.  Primary prevention stressed.  Importance of compliance with diet and medications stressed and he vocalized understanding. 2. He has multiple risk factors for coronary artery disease and therefore I recommended Lexiscan stress testing.  If this is negative and he is not at high risk for coronary events during the aforementioned surgery.  Meticulous hemodynamic monitoring will further reduce the risk of coronary events. 3.    Medication Adjustments/Labs and Tests Ordered: Current medicines are reviewed at length with the patient today.  Concerns regarding medicines are outlined above.  Orders Placed This Encounter  Procedures  . Myocardial Perfusion Imaging   No orders of the defined types were placed in this encounter.    History of Present Illness:    [name] Short is a 71 y.o. male who is being seen today for the evaluation for preop risk stratification reasons at the request of Asencion Noble, MD.  Patient is a pleasant 71 year old male.  He has past medical history of essential hypertension and renal insufficiency.  He is planning to undergo knee surgery.  He leads a sedentary lifestyle.  He denies any chest pain orthopnea or PND.  No syncope or any such problems no dizziness.  He is referred here for a cardiovascular evaluation.  At the time of my evaluation, the patient is alert awake oriented and in no distress.  Patient has had multiple  problems with complex past medical history treatment for multiple cancers.  He is an ex-smoker and quit in 2009.  Past Medical History:  Diagnosis Date  . Arthritis    ESP IN HANDS  . Cancer ([place])    OCCULAR RIGHT EYE--SURGERY AND CHEMO  PT IS LEGALLY BLIND IN RT EYE  . Cataract    LEFT EYE  . COPD (chronic obstructive pulmonary disease) ([place])    FORMER SMOKER  . Erectile dysfunction   . GERD (gastroesophageal reflux disease)   . Glaucoma    ONLY IN RIGHT EYE  . Hypertension   . Lung cancer ([place])    lung ca dx 02/2010  . Paresthesia and pain of right extremity    RIGHT SHOULDER   . Pneumonia 2011  . Prostate cancer [facility])    prostate ca dx 11/11  . Shortness of breath    ONLY WITH EXERTION; HX OF LOBECTOMY FOR CANER    Past Surgical History:  Procedure Laterality Date  . APPENDECTOMY  JUNE 2000  . CARDIAC CATHETERIZATION  2008   indication was due to LOC, chest tightness, diaphoreses; seen at [facility]; PER PATIENT , NORMAL ; no records in epic ; endoreses repeat intermittent sx since time of cath   . COLONOSCOPY  08/06/2012   Procedure: COLONOSCOPY;  Surgeon: Rogene Houston, MD;  Location: [facility];  Service: Endoscopy;  Laterality: N/A;  955  . COLONOSCOPY N/A 08/08/2015   Procedure: COLONOSCOPY;  Surgeon: Rogene Houston, MD;  Location: [facility];  Service: Endoscopy;  Laterality: N/A;  1200  . ELBOW SURGERY    . EYE SURGERY     OCT 1999 DETACHED RETINA -RIGHT; MAY AND JUNE 2009 RIGHT EYE SURGERY FOR CANCER-LENS HAS BEEN REMOVED.  [name] LEFT UPPER LOBECTOMY  03/2010   FOR CANCER  . PENILE PROSTHESIS IMPLANT  08/10/2012   Procedure: PENILE PROTHESIS INFLATABLE;  Surgeon: Malka So, MD;  Location: [facility] OR[facility];  Service: Urology;  Laterality: N/A;  IMPLANT 3 PIECE PENILE PROSTHESIS   . PROSTATECTOMY  DEC 2011   FOR CANCER    Current Medications: Current Meds  Medication Sig  . acetaminophen (TYLENOL) 650 MG CR tablet Take 1,300 mg by mouth every morning.  Reported on 02/06/2016  . chlorthalidone (HYGROTON) 25 MG tablet Take 25 mg by mouth daily.   [name] losartan (COZAAR) 100 MG tablet Take 100 mg by mouth daily.  [name] NIFEdipine (PROCARDIA XL/ADALAT-CC) 90 MG 24 hr tablet Take 90 mg by mouth daily.   [name] omeprazole (PRILOSEC) 20 MG capsule Take 20 mg by mouth daily.     Allergies:   Patient has no known allergies.   Social History   Socioeconomic History  . Marital status: Married    Spouse name: None  . Number of children: None  . Years of education: None  . Highest education level: None  Social Needs  . Financial resource strain: None  . Food insecurity - worry: None  . Food insecurity - inability: None  . Transportation needs - medical: None  . Transportation needs - non-medical: None  Occupational History  . None  Tobacco Use  . Smoking status: Former Smoker    Packs/day: 1.00    Years: 40.00    Pack years: 40.00    Types: Cigarettes    Last attempt to quit: 09/07/2008    Years since quitting: 9.0  . Smokeless tobacco: Former Systems developer    Types: [place] date: 10/08/1985  Substance and Sexual Activity  . Alcohol use: No  . Drug use: No  . Sexual activity: None  Other Topics Concern  . None  Social History Narrative  . None     Family History: The patient's family history includes Colon polyps in his brother, brother, brother, and brother.  ROS:   Please see the history of present illness.    All other systems reviewed and are negative.  EKGs/Labs/Other Studies Reviewed:    The following studies were reviewed today: I reviewed EKG and this revealed sinus rhythm and nonspecific ST-T changes.  I reviewed records from primary care physician office extensively.   Recent Labs: 09/03/2017: BUN 31; Creatinine, Ser 1.35; Hemoglobin 13.4; Platelets 349; Potassium 5.0; Sodium 138  Recent Lipid Panel No results found for: CHOL, TRIG, HDL, CHOLHDL, VLDL, LDLCALC, LDLDIRECT  Physical Exam:    VS:  BP 140/72   Pulse 81    Ht 5\' 5"  (1.651 m)   Wt 191 lb 1.9 oz (86.7 kg)   SpO2 96%   BMI 31.80 kg/m     Wt Readings from Last 3 Encounters:  09/07/17 191 lb 1.9 oz (86.7 kg)  09/03/17 189 lb (85.7 kg)  04/03/17 185 lb (83.9 kg)     GEN: Patient is in no acute distress HEENT: Normal NECK: No JVD; No carotid bruits LYMPHATICS: No lymphadenopathy CARDIAC: S1 S2 regular, 2/6 systolic murmur at the apex. RESPIRATORY:  Clear to auscultation without rales, wheezing or  rhonchi  ABDOMEN: Soft, non-tender, non-distended MUSCULOSKELETAL:  No edema; No deformity  SKIN: Warm and dry NEUROLOGIC:  Alert and oriented x 3 PSYCHIATRIC:  Normal affect    Signed, Jenean Lindau, MD  09/07/2017 10:51 AM    [place]

## 2017-09-09 ENCOUNTER — Ambulatory Visit (HOSPITAL_BASED_OUTPATIENT_CLINIC_OR_DEPARTMENT_OTHER)
Admission: RE | Admit: 2017-09-09 | Discharge: 2017-09-09 | Disposition: A | Discharge status: Home / Self Care | Payer: Medicare Other | Source: Ambulatory Visit | Attending: Cardiology | Admitting: Cardiology

## 2017-09-09 DIAGNOSIS — Z902 Acquired absence of lung [part of]: Secondary | ICD-10-CM | POA: Insufficient documentation

## 2017-09-09 DIAGNOSIS — Z87891 Personal history of nicotine dependence: Secondary | ICD-10-CM

## 2017-09-09 DIAGNOSIS — Z85118 Personal history of other malignant neoplasm of bronchus and lung: Secondary | ICD-10-CM | POA: Insufficient documentation

## 2017-09-09 DIAGNOSIS — Z01810 Encounter for preprocedural cardiovascular examination: Secondary | ICD-10-CM

## 2017-09-09 DIAGNOSIS — R0609 Other forms of dyspnea: Secondary | ICD-10-CM | POA: Insufficient documentation

## 2017-09-09 DIAGNOSIS — N289 Disorder of kidney and ureter, unspecified: Secondary | ICD-10-CM

## 2017-09-09 DIAGNOSIS — I10 Essential (primary) hypertension: Secondary | ICD-10-CM

## 2017-09-09 DIAGNOSIS — Z85840 Personal history of malignant neoplasm of eye: Secondary | ICD-10-CM | POA: Insufficient documentation

## 2017-09-09 DIAGNOSIS — J449 Chronic obstructive pulmonary disease, unspecified: Secondary | ICD-10-CM | POA: Insufficient documentation

## 2017-09-09 LAB — MYOCARDIAL PERFUSION IMAGING
CHL CUP NUCLEAR SSS: 5
CHL CUP RESTING HR STRESS: 64 {beats}/min
LVDIAVOL: 83 mL (ref 62–150)
LVSYSVOL: 32 mL
NUC STRESS TID: 0.89
Peak HR: 86 {beats}/min
SDS: 2
SRS: 3

## 2017-09-09 MED ORDER — TECHNETIUM TC 99M TETROFOSMIN IV KIT
30.1000 | PACK | Freq: Once | INTRAVENOUS | Status: AC | PRN
Start: 2017-09-09 — End: 2017-09-09
  Administered 2017-09-09: 30.1 via INTRAVENOUS
  Filled 2017-09-09: qty 31

## 2017-09-09 MED ORDER — REGADENOSON 0.4 MG/5ML IV SOLN
0.4000 mg | Freq: Once | INTRAVENOUS | Status: AC
  Administered 2017-09-09: 0.4 mg via INTRAVENOUS

## 2017-09-09 MED ORDER — TECHNETIUM TC 99M TETROFOSMIN IV KIT
10.9000 | PACK | Freq: Once | INTRAVENOUS | Status: AC | PRN
  Administered 2017-09-09: 10.9 via INTRAVENOUS
  Filled 2017-09-09: qty 11

## 2017-09-09 NOTE — Anesthesia Preprocedure Evaluation (Addendum)
Anesthesia Evaluation  Patient identified by MRN, date of birth, ID band Patient awake    Reviewed: Allergy & Precautions, NPO status , Patient's Chart, lab work & pertinent test results  History of Anesthesia Complications Negative for: history of anesthetic complications  Airway Mallampati: II  TM Distance: >3 FB Neck ROM: Full    Dental  (+) Caps, Dental Advisory Given   Pulmonary shortness of breath and with exertion, COPD, former smoker (quit 2009),  Lung cancer: s/p lobectomy LUL   breath sounds clear to auscultation       Cardiovascular hypertension, Pt. on medications (-) angina Rhythm:Regular Rate:Normal  09/09/17 Stress: Normal stress nuclear study with no ischemia or infarction; EF 61 with normal wall motion.     Neuro/Psych glaucoma    GI/Hepatic Neg liver ROS, GERD  Medicated and Controlled,  Endo/Other  Morbid obesity  Renal/GU Renal InsufficiencyRenal disease (creat 1.35)     Musculoskeletal  (+) Arthritis , Osteoarthritis,    Abdominal (+) + obese,   Peds  Hematology negative hematology ROS (+)   Anesthesia Other Findings   Reproductive/Obstetrics                            Anesthesia Physical Anesthesia Plan  ASA: III  Anesthesia Plan: Spinal   Post-op Pain Management:  Regional for Post-op pain   Induction:   PONV Risk Score and Plan: 1 and Ondansetron and Dexamethasone  Airway Management Planned: Natural Airway and Nasal Cannula  Additional Equipment:   Intra-op Plan:   Post-operative Plan:   Informed Consent: I have reviewed the patients History and Physical, chart, labs and discussed the procedure including the risks, benefits and alternatives for the proposed anesthesia with the patient or authorized representative who has indicated his/her understanding and acceptance.   Dental advisory given  Plan Discussed with: CRNA and Surgeon  Anesthesia  Plan Comments: (Plan routine monitors, SAB with adductor canal block for post op analgesia)        Anesthesia Quick Evaluation

## 2017-09-09 NOTE — Progress Notes (Signed)
[place] cardiology Dr Geraldo Pitter 09-07-17 epic  Stress test 09-09-17 epic

## 2017-09-10 ENCOUNTER — Inpatient Hospital Stay (HOSPITAL_COMMUNITY)
Admission: RE | Admit: 2017-09-10 | Discharge: 2017-09-12 | DRG: 470 | Disposition: A | Discharge status: Home / Self Care | Payer: Medicare Other | Source: Ambulatory Visit | Attending: Specialist | Admitting: Specialist

## 2017-09-10 ENCOUNTER — Inpatient Hospital Stay (HOSPITAL_COMMUNITY): Payer: Medicare Other | Admitting: Anesthesiology

## 2017-09-10 ENCOUNTER — Encounter (HOSPITAL_COMMUNITY): Payer: Self-pay | Admitting: *Deleted

## 2017-09-10 ENCOUNTER — Other Ambulatory Visit: Payer: Self-pay

## 2017-09-10 ENCOUNTER — Inpatient Hospital Stay (HOSPITAL_COMMUNITY): Payer: Medicare Other

## 2017-09-10 ENCOUNTER — Encounter (HOSPITAL_COMMUNITY)
Admission: RE | Disposition: A | Discharge status: Home / Self Care | Payer: Self-pay | Source: Ambulatory Visit | Attending: Specialist

## 2017-09-10 DIAGNOSIS — Z7982 Long term (current) use of aspirin: Secondary | ICD-10-CM

## 2017-09-10 DIAGNOSIS — I10 Essential (primary) hypertension: Secondary | ICD-10-CM | POA: Diagnosis present

## 2017-09-10 DIAGNOSIS — G8918 Other acute postprocedural pain: Secondary | ICD-10-CM | POA: Diagnosis not present

## 2017-09-10 DIAGNOSIS — M1712 Unilateral primary osteoarthritis, left knee: Secondary | ICD-10-CM | POA: Diagnosis present

## 2017-09-10 DIAGNOSIS — Z96659 Presence of unspecified artificial knee joint: Secondary | ICD-10-CM

## 2017-09-10 DIAGNOSIS — M21162 Varus deformity, not elsewhere classified, left knee: Secondary | ICD-10-CM | POA: Diagnosis not present

## 2017-09-10 DIAGNOSIS — M25562 Pain in left knee: Secondary | ICD-10-CM | POA: Diagnosis not present

## 2017-09-10 DIAGNOSIS — Z6831 Body mass index (BMI) 31.0-31.9, adult: Secondary | ICD-10-CM | POA: Diagnosis not present

## 2017-09-10 DIAGNOSIS — K219 Gastro-esophageal reflux disease without esophagitis: Secondary | ICD-10-CM | POA: Diagnosis present

## 2017-09-10 DIAGNOSIS — Z96652 Presence of left artificial knee joint: Secondary | ICD-10-CM | POA: Diagnosis not present

## 2017-09-10 DIAGNOSIS — H409 Unspecified glaucoma: Secondary | ICD-10-CM | POA: Diagnosis present

## 2017-09-10 DIAGNOSIS — M170 Bilateral primary osteoarthritis of knee: Secondary | ICD-10-CM | POA: Diagnosis present

## 2017-09-10 DIAGNOSIS — Z87891 Personal history of nicotine dependence: Secondary | ICD-10-CM

## 2017-09-10 DIAGNOSIS — J449 Chronic obstructive pulmonary disease, unspecified: Secondary | ICD-10-CM | POA: Diagnosis not present

## 2017-09-10 DIAGNOSIS — Z471 Aftercare following joint replacement surgery: Secondary | ICD-10-CM | POA: Diagnosis not present

## 2017-09-10 HISTORY — PX: TOTAL KNEE ARTHROPLASTY: SHX125

## 2017-09-10 SURGERY — ARTHROPLASTY, KNEE, TOTAL
Anesthesia: General | Site: Knee | Laterality: Left

## 2017-09-10 MED ORDER — METHOCARBAMOL 500 MG PO TABS
500.0000 mg | ORAL_TABLET | Freq: Four times a day (QID) | ORAL | Status: DC | PRN
  Administered 2017-09-11 (×2): 500 mg via ORAL
  Filled 2017-09-10 (×2): qty 1

## 2017-09-10 MED ORDER — CHLORTHALIDONE 25 MG PO TABS
25.0000 mg | ORAL_TABLET | Freq: Every day | ORAL | Status: DC
  Administered 2017-09-11 – 2017-09-12 (×2): 25 mg via ORAL
  Filled 2017-09-10 (×2): qty 1

## 2017-09-10 MED ORDER — METOCLOPRAMIDE HCL 5 MG PO TABS: 5.0000 mg | ORAL_TABLET | Freq: Three times a day (TID) | ORAL | Status: DC | PRN

## 2017-09-10 MED ORDER — ACETAMINOPHEN 10 MG/ML IV SOLN
INTRAVENOUS | Status: AC
  Filled 2017-09-10: qty 100

## 2017-09-10 MED ORDER — DEXTROSE 5 % IV SOLN
500.0000 mg | Freq: Four times a day (QID) | INTRAVENOUS | Status: DC | PRN
  Administered 2017-09-10: 500 mg via INTRAVENOUS
  Filled 2017-09-10: qty 550

## 2017-09-10 MED ORDER — MIDAZOLAM HCL 2 MG/2ML IJ SOLN
INTRAMUSCULAR | Status: AC
  Filled 2017-09-10: qty 2

## 2017-09-10 MED ORDER — CEFAZOLIN SODIUM-DEXTROSE 2-4 GM/100ML-% IV SOLN
INTRAVENOUS | Status: AC
  Filled 2017-09-10: qty 100

## 2017-09-10 MED ORDER — PROMETHAZINE HCL 25 MG/ML IJ SOLN: 6.2500 mg | INTRAMUSCULAR | Status: DC | PRN

## 2017-09-10 MED ORDER — CEFAZOLIN SODIUM-DEXTROSE 2-4 GM/100ML-% IV SOLN
2.0000 g | INTRAVENOUS | Status: AC
  Administered 2017-09-10: 2 g via INTRAVENOUS

## 2017-09-10 MED ORDER — PHENYLEPHRINE HCL 10 MG/ML IJ SOLN
INTRAMUSCULAR | Status: AC
  Filled 2017-09-10: qty 1

## 2017-09-10 MED ORDER — ACETAMINOPHEN 650 MG RE SUPP
650.0000 mg | RECTAL | Status: DC | PRN
Start: 2017-09-10 — End: 2017-09-12

## 2017-09-10 MED ORDER — METOCLOPRAMIDE HCL 5 MG/ML IJ SOLN: 5.0000 mg | Freq: Three times a day (TID) | INTRAMUSCULAR | Status: DC | PRN

## 2017-09-10 MED ORDER — PANTOPRAZOLE SODIUM 40 MG PO TBEC
40.0000 mg | DELAYED_RELEASE_TABLET | Freq: Every day | ORAL | Status: DC
  Administered 2017-09-10 – 2017-09-12 (×3): 40 mg via ORAL
  Filled 2017-09-10 (×3): qty 1

## 2017-09-10 MED ORDER — BUPIVACAINE-EPINEPHRINE 0.25% -1:200000 IJ SOLN
INTRAMUSCULAR | Status: DC | PRN
  Administered 2017-09-10: 40 mL

## 2017-09-10 MED ORDER — BUPIVACAINE-EPINEPHRINE 0.25% -1:200000 IJ SOLN
INTRAMUSCULAR | Status: AC
  Filled 2017-09-10: qty 1

## 2017-09-10 MED ORDER — ONDANSETRON HCL 4 MG PO TABS: 4.0000 mg | ORAL_TABLET | Freq: Four times a day (QID) | ORAL | Status: DC | PRN

## 2017-09-10 MED ORDER — NIFEDIPINE ER 60 MG PO TB24
90.0000 mg | ORAL_TABLET | Freq: Every day | ORAL | Status: DC
  Administered 2017-09-11 – 2017-09-12 (×2): 90 mg via ORAL
  Filled 2017-09-10 (×7): qty 1

## 2017-09-10 MED ORDER — FENTANYL CITRATE (PF) 100 MCG/2ML IJ SOLN
INTRAMUSCULAR | Status: AC
  Filled 2017-09-10: qty 2

## 2017-09-10 MED ORDER — CEFAZOLIN SODIUM-DEXTROSE 2-4 GM/100ML-% IV SOLN
2.0000 g | Freq: Four times a day (QID) | INTRAVENOUS | Status: AC
  Administered 2017-09-10 – 2017-09-11 (×3): 2 g via INTRAVENOUS
  Filled 2017-09-10 (×3): qty 100

## 2017-09-10 MED ORDER — ASPIRIN EC 325 MG PO TBEC
325.0000 mg | DELAYED_RELEASE_TABLET | Freq: Two times a day (BID) | ORAL | Status: DC
  Administered 2017-09-11 – 2017-09-12 (×3): 325 mg via ORAL
  Filled 2017-09-10 (×3): qty 1

## 2017-09-10 MED ORDER — PHENOL 1.4 % MT LIQD
1.0000 | OROMUCOSAL | Status: DC | PRN
  Filled 2017-09-10: qty 177

## 2017-09-10 MED ORDER — MIDAZOLAM HCL 2 MG/2ML IJ SOLN
0.5000 mg | Freq: Once | INTRAMUSCULAR | Status: AC | PRN
  Administered 2017-09-10: 0.5 mg via INTRAVENOUS

## 2017-09-10 MED ORDER — OXYCODONE HCL 5 MG PO TABS: 5.0000 mg | ORAL_TABLET | ORAL | Status: DC | PRN

## 2017-09-10 MED ORDER — DEXAMETHASONE SODIUM PHOSPHATE 10 MG/ML IJ SOLN
INTRAMUSCULAR | Status: AC
  Filled 2017-09-10: qty 1

## 2017-09-10 MED ORDER — KCL IN DEXTROSE-NACL 20-5-0.45 MEQ/L-%-% IV SOLN
INTRAVENOUS | Status: AC
  Administered 2017-09-10: 13:00:00 via INTRAVENOUS
  Filled 2017-09-10 (×2): qty 1000

## 2017-09-10 MED ORDER — BUPIVACAINE IN DEXTROSE 0.75-8.25 % IT SOLN
INTRATHECAL | Status: DC | PRN
  Administered 2017-09-10: 2 mL via INTRATHECAL

## 2017-09-10 MED ORDER — PHENYLEPHRINE HCL 10 MG/ML IJ SOLN
INTRAMUSCULAR | Status: DC | PRN
  Administered 2017-09-10 (×2): 80 ug via INTRAVENOUS

## 2017-09-10 MED ORDER — HYDROMORPHONE HCL 1 MG/ML IJ SOLN: 1.0000 mg | INTRAMUSCULAR | Status: DC | PRN

## 2017-09-10 MED ORDER — MIDAZOLAM HCL 5 MG/5ML IJ SOLN
INTRAMUSCULAR | Status: DC | PRN
  Administered 2017-09-10 (×2): 1 mg via INTRAVENOUS

## 2017-09-10 MED ORDER — DEXAMETHASONE SODIUM PHOSPHATE 10 MG/ML IJ SOLN
INTRAMUSCULAR | Status: DC | PRN
  Administered 2017-09-10: 10 mg via INTRAVENOUS

## 2017-09-10 MED ORDER — POLYMYXIN B SULFATE 500000 UNITS IJ SOLR
INTRAMUSCULAR | Status: AC
  Filled 2017-09-10: qty 500000

## 2017-09-10 MED ORDER — POLYETHYLENE GLYCOL 3350 17 G PO PACK: 17.0000 g | PACK | Freq: Every day | ORAL | Status: DC | PRN

## 2017-09-10 MED ORDER — BISACODYL 5 MG PO TBEC: 5.0000 mg | DELAYED_RELEASE_TABLET | Freq: Every day | ORAL | Status: DC | PRN

## 2017-09-10 MED ORDER — LOSARTAN POTASSIUM 50 MG PO TABS
100.0000 mg | ORAL_TABLET | Freq: Every day | ORAL | Status: DC
  Administered 2017-09-11 – 2017-09-12 (×2): 100 mg via ORAL
  Filled 2017-09-10: qty 2

## 2017-09-10 MED ORDER — DOCUSATE SODIUM 100 MG PO CAPS
100.0000 mg | ORAL_CAPSULE | Freq: Two times a day (BID) | ORAL | Status: DC
  Administered 2017-09-10 – 2017-09-12 (×4): 100 mg via ORAL
  Filled 2017-09-10 (×4): qty 1

## 2017-09-10 MED ORDER — DIPHENHYDRAMINE HCL 12.5 MG/5ML PO ELIX: 12.5000 mg | ORAL_SOLUTION | ORAL | Status: DC | PRN

## 2017-09-10 MED ORDER — HYDROMORPHONE HCL 1 MG/ML IJ SOLN
0.2500 mg | INTRAMUSCULAR | Status: DC | PRN
  Administered 2017-09-10: 0.5 mg via INTRAVENOUS

## 2017-09-10 MED ORDER — HYDROMORPHONE HCL 1 MG/ML IJ SOLN
INTRAMUSCULAR | Status: AC
  Filled 2017-09-10: qty 1

## 2017-09-10 MED ORDER — FENTANYL CITRATE (PF) 100 MCG/2ML IJ SOLN
INTRAMUSCULAR | Status: DC | PRN
  Administered 2017-09-10: 50 ug via INTRAVENOUS
  Administered 2017-09-10: 25 ug via INTRAVENOUS
  Administered 2017-09-10 (×2): 50 ug via INTRAVENOUS
  Administered 2017-09-10: 25 ug via INTRAVENOUS

## 2017-09-10 MED ORDER — DOCUSATE SODIUM 100 MG PO CAPS
100.0000 mg | ORAL_CAPSULE | Freq: Two times a day (BID) | ORAL | 1 refills | Status: DC | PRN
Start: 2017-09-10 — End: 2021-12-16

## 2017-09-10 MED ORDER — PHENYLEPHRINE HCL 10 MG/ML IJ SOLN
INTRAVENOUS | Status: DC | PRN
  Administered 2017-09-10: 25 ug/min via INTRAVENOUS

## 2017-09-10 MED ORDER — ACETAMINOPHEN 10 MG/ML IV SOLN
1000.0000 mg | INTRAVENOUS | Status: AC
  Administered 2017-09-10: 1000 mg via INTRAVENOUS

## 2017-09-10 MED ORDER — OXYCODONE-ACETAMINOPHEN 5-325 MG PO TABS: 1.0000 | ORAL_TABLET | ORAL | 0 refills | Status: DC | PRN

## 2017-09-10 MED ORDER — ALUM & MAG HYDROXIDE-SIMETH 200-200-20 MG/5ML PO SUSP: 30.0000 mL | ORAL | Status: DC | PRN

## 2017-09-10 MED ORDER — ACETAMINOPHEN 325 MG PO TABS: 650.0000 mg | ORAL_TABLET | ORAL | Status: DC | PRN

## 2017-09-10 MED ORDER — ONDANSETRON HCL 4 MG/2ML IJ SOLN: 4.0000 mg | Freq: Four times a day (QID) | INTRAMUSCULAR | Status: DC | PRN

## 2017-09-10 MED ORDER — ONDANSETRON HCL 4 MG/2ML IJ SOLN
INTRAMUSCULAR | Status: AC
  Filled 2017-09-10: qty 2

## 2017-09-10 MED ORDER — POLYETHYLENE GLYCOL 3350 17 G PO PACK: 17.0000 g | PACK | Freq: Every day | ORAL | 0 refills | Status: DC

## 2017-09-10 MED ORDER — OXYCODONE HCL 5 MG PO TABS
10.0000 mg | ORAL_TABLET | ORAL | Status: DC | PRN
  Administered 2017-09-10 – 2017-09-11 (×7): 10 mg via ORAL
  Filled 2017-09-10 (×7): qty 2

## 2017-09-10 MED ORDER — ONDANSETRON HCL 4 MG/2ML IJ SOLN
INTRAMUSCULAR | Status: DC | PRN
  Administered 2017-09-10: 4 mg via INTRAVENOUS

## 2017-09-10 MED ORDER — MENTHOL 3 MG MT LOZG: 1.0000 | LOZENGE | OROMUCOSAL | Status: DC | PRN

## 2017-09-10 MED ORDER — STERILE WATER FOR IRRIGATION IR SOLN
Status: DC | PRN
  Administered 2017-09-10: 2000 mL

## 2017-09-10 MED ORDER — MEPERIDINE HCL 50 MG/ML IJ SOLN: 6.2500 mg | INTRAMUSCULAR | Status: DC | PRN

## 2017-09-10 MED ORDER — SODIUM CHLORIDE 0.9 % IR SOLN
Status: DC | PRN
  Administered 2017-09-10 (×2): 1000 mL

## 2017-09-10 MED ORDER — 0.9 % SODIUM CHLORIDE (POUR BTL) OPTIME
TOPICAL | Status: DC | PRN
  Administered 2017-09-10: 1000 mL

## 2017-09-10 MED ORDER — PROPOFOL 10 MG/ML IV BOLUS
INTRAVENOUS | Status: AC
  Filled 2017-09-10: qty 40

## 2017-09-10 MED ORDER — RISAQUAD PO CAPS
1.0000 | ORAL_CAPSULE | Freq: Every day | ORAL | Status: DC
  Administered 2017-09-10 – 2017-09-12 (×3): 1 via ORAL
  Filled 2017-09-10 (×3): qty 1

## 2017-09-10 MED ORDER — MAGNESIUM CITRATE PO SOLN: 1.0000 | Freq: Once | ORAL | Status: DC | PRN

## 2017-09-10 MED ORDER — TRANEXAMIC ACID 1000 MG/10ML IV SOLN
1000.0000 mg | INTRAVENOUS | Status: AC
  Administered 2017-09-10: 1000 mg via INTRAVENOUS
  Filled 2017-09-10: qty 10

## 2017-09-10 MED ORDER — LACTATED RINGERS IV SOLN
INTRAVENOUS | Status: DC
  Administered 2017-09-10 (×3): via INTRAVENOUS

## 2017-09-10 MED ORDER — SODIUM CHLORIDE 0.9 % IV SOLN
INTRAVENOUS | Status: DC | PRN
  Administered 2017-09-10: 500 mL

## 2017-09-10 MED ORDER — PROPOFOL 10 MG/ML IV BOLUS
INTRAVENOUS | Status: DC | PRN
  Administered 2017-09-10: 150 mg via INTRAVENOUS

## 2017-09-10 MED ORDER — ROPIVACAINE HCL 7.5 MG/ML IJ SOLN
INTRAMUSCULAR | Status: DC | PRN
  Administered 2017-09-10: 20 mL via PERINEURAL

## 2017-09-10 MED ORDER — ASPIRIN EC 325 MG PO TBEC: 325.0000 mg | DELAYED_RELEASE_TABLET | Freq: Two times a day (BID) | ORAL | 1 refills | Status: DC

## 2017-09-10 SURGICAL SUPPLY — 60 items
AGENT HMST SPONGE THK3/8 (HEMOSTASIS) ×1
BAG SPEC THK2 15X12 ZIP CLS (MISCELLANEOUS)
BAG ZIPLOCK 12X15 (MISCELLANEOUS) IMPLANT
BANDAGE ACE 4X5 VEL STRL LF (GAUZE/BANDAGES/DRESSINGS) ×3 IMPLANT
BANDAGE ACE 6X5 VEL STRL LF (GAUZE/BANDAGES/DRESSINGS) ×3 IMPLANT
BLADE SAG 18X100X1.27 (BLADE) ×3 IMPLANT
BLADE SAW SGTL 11.0X1.19X90.0M (BLADE) ×3 IMPLANT
BLADE SAW SGTL 13.0X1.19X90.0M (BLADE) ×5 IMPLANT
CAPT KNEE TOTAL 3 ATTUNE ×2 IMPLANT
CEMENT HV SMART SET (Cement) ×6 IMPLANT
CLOSURE WOUND 1/2 X4 (GAUZE/BANDAGES/DRESSINGS) ×1
CLOTH 2% CHLOROHEXIDINE 3PK (PERSONAL CARE ITEMS) ×3 IMPLANT
COVER SURGICAL LIGHT HANDLE (MISCELLANEOUS) ×3 IMPLANT
CUFF TOURN SGL QUICK 34 (TOURNIQUET CUFF) ×3
CUFF TRNQT CYL 34X4X40X1 (TOURNIQUET CUFF) ×1 IMPLANT
DECANTER SPIKE VIAL GLASS SM (MISCELLANEOUS) ×3 IMPLANT
DRAPE INCISE IOBAN 66X45 STRL (DRAPES) ×2 IMPLANT
DRAPE ORTHO SPLIT 77X108 STRL (DRAPES) ×6
DRAPE SHEET LG 3/4 BI-LAMINATE (DRAPES) ×5 IMPLANT
DRAPE SURG ORHT 6 SPLT 77X108 (DRAPES) ×2 IMPLANT
DRAPE U-SHAPE 47X51 STRL (DRAPES) ×3 IMPLANT
DRSG AQUACEL AG ADV 3.5X10 (GAUZE/BANDAGES/DRESSINGS) ×2 IMPLANT
DRSG TEGADERM 4X4.75 (GAUZE/BANDAGES/DRESSINGS) ×2 IMPLANT
DURAPREP 26ML APPLICATOR (WOUND CARE) ×3 IMPLANT
ELECT REM PT RETURN 15FT ADLT (MISCELLANEOUS) ×3 IMPLANT
EVACUATOR 1/8 PVC DRAIN (DRAIN) IMPLANT
GAUZE SPONGE 2X2 8PLY STRL LF (GAUZE/BANDAGES/DRESSINGS) IMPLANT
GLOVE BIOGEL PI IND STRL 7.0 (GLOVE) ×1 IMPLANT
GLOVE BIOGEL PI IND STRL 8 (GLOVE) ×1 IMPLANT
GLOVE BIOGEL PI INDICATOR 7.0 (GLOVE) ×2
GLOVE BIOGEL PI INDICATOR 8 (GLOVE) ×2
GLOVE SURG SS PI 7.0 STRL IVOR (GLOVE) ×3 IMPLANT
GLOVE SURG SS PI 7.5 STRL IVOR (GLOVE) ×3 IMPLANT
GLOVE SURG SS PI 8.0 STRL IVOR (GLOVE) ×6 IMPLANT
GOWN STRL REUS W/TWL XL LVL3 (GOWN DISPOSABLE) ×6 IMPLANT
HANDPIECE INTERPULSE COAX TIP (DISPOSABLE) ×3
HEMOSTAT SPONGE AVITENE ULTRA (HEMOSTASIS) ×3 IMPLANT
IMMOBILIZER KNEE 20 (SOFTGOODS) ×5 IMPLANT
IMMOBILIZER KNEE 20 THIGH 36 (SOFTGOODS) ×1 IMPLANT
MANIFOLD NEPTUNE II (INSTRUMENTS) ×3 IMPLANT
PACK TOTAL KNEE CUSTOM (KITS) ×3 IMPLANT
POSITIONER SURGICAL ARM (MISCELLANEOUS) ×3 IMPLANT
SET HNDPC FAN SPRY TIP SCT (DISPOSABLE) ×1 IMPLANT
SPONGE GAUZE 2X2 STER 10/PKG (GAUZE/BANDAGES/DRESSINGS) ×2
SPONGE SURGIFOAM ABS GEL 100 (HEMOSTASIS) IMPLANT
STAPLER VISISTAT (STAPLE) ×2 IMPLANT
STRIP CLOSURE SKIN 1/2X4 (GAUZE/BANDAGES/DRESSINGS) ×1 IMPLANT
SUT BONE WAX W31G (SUTURE) IMPLANT
SUT MNCRL AB 4-0 PS2 18 (SUTURE) IMPLANT
SUT STRATAFIX 0 PDS 27 VIOLET (SUTURE) ×3
SUT VIC AB 1 CT1 27 (SUTURE) ×9
SUT VIC AB 1 CT1 27XBRD ANTBC (SUTURE) ×2 IMPLANT
SUT VIC AB 2-0 CT1 27 (SUTURE) ×9
SUT VIC AB 2-0 CT1 TAPERPNT 27 (SUTURE) ×3 IMPLANT
SUTURE STRATFX 0 PDS 27 VIOLET (SUTURE) ×1 IMPLANT
SYR 50ML LL SCALE MARK (SYRINGE) IMPLANT
TOWER CARTRIDGE SMART MIX (DISPOSABLE) ×3 IMPLANT
TRAY FOLEY W/METER SILVER 16FR (SET/KITS/TRAYS/PACK) ×3 IMPLANT
WRAP KNEE MAXI GEL POST OP (GAUZE/BANDAGES/DRESSINGS) ×3 IMPLANT
YANKAUER SUCT BULB TIP 10FT TU (MISCELLANEOUS) ×3 IMPLANT

## 2017-09-10 NOTE — Progress Notes (Signed)
AssistedDr. Annye Asa with left, ultrasound guided, adductor canal block. Side rails up, monitors on throughout procedure. See vital signs in flow sheet. Tolerated Procedure well.

## 2017-09-10 NOTE — Discharge Instructions (Signed)

## 2017-09-10 NOTE — Anesthesia Procedure Notes (Signed)
Procedure Name: LMA Insertion Date/Time: 09/10/2017 8:02 AM Performed by: Glory Buff, CRNA Pre-anesthesia Checklist: Patient identified, Emergency Drugs available, Suction available and Patient being monitored Patient Re-evaluated:Patient Re-evaluated prior to induction Oxygen Delivery Method: Circle system utilized Preoxygenation: Pre-oxygenation with 100% oxygen Induction Type: IV induction LMA: LMA inserted LMA Size: 4.0 Number of attempts: 1 Placement Confirmation: positive ETCO2 Tube secured with: Tape Dental Injury: Teeth and Oropharynx as per pre-operative assessment

## 2017-09-10 NOTE — Transfer of Care (Signed)
Immediate Anesthesia Transfer of Care Note  Patient: [name] Short  Procedure(s) Performed: LEFT TOTAL KNEE ARTHROPLASTY (Left Knee)  Patient Location: PACU  Anesthesia Type:General and GA combined with regional for post-op pain  Level of Consciousness: awake, alert  and oriented  Airway & Oxygen Therapy: Patient Spontanous Breathing and Patient connected to face mask oxygen  Post-op Assessment: Report given to RN and Post -op Vital signs reviewed and stable  Post vital signs: Reviewed and stable  Last Vitals:  Vitals:   09/10/17 0525  BP: 120/74  Pulse: 73  Resp: 16  Temp: 36.7 C  SpO2: 98%    Last Pain:  Vitals:   09/10/17 0525  TempSrc: Oral      Patients Stated Pain Goal: 3 (03/83/33 8329)  Complications: No apparent anesthesia complications

## 2017-09-10 NOTE — Interval H&P Note (Signed)
History and Physical Interval Note:  09/10/2017 7:28 AM  [name] Short  has presented today for surgery, with the diagnosis of Degenerative joint disease left knee  The various methods of treatment have been discussed with the patient and family. After consideration of risks, benefits and other options for treatment, the patient has consented to  Procedure(s) with comments: LEFT TOTAL KNEE ARTHROPLASTY (Left) - 120 mins as a surgical intervention .  The patient's history has been reviewed, patient examined, no change in status, stable for surgery.  I have reviewed the patient's chart and labs.  Questions were answered to the patient's satisfaction.     [name]

## 2017-09-10 NOTE — Brief Op Note (Signed)
09/10/2017  9:18 AM  PATIENT:  [name] Short  71 y.o. male  PRE-OPERATIVE DIAGNOSIS:  Degenerative joint disease left knee  POST-OPERATIVE DIAGNOSIS:  Degenerative joint disease left knee  PROCEDURE:  Procedure(s) with comments: LEFT TOTAL KNEE ARTHROPLASTY (Left) - 120 mins  SURGEON:  Surgeon(s) and Role:    Susa Day, MD - Primary  PHYSICIAN ASSISTANT:   ASSISTANTS: [place]   ANESTHESIA:   general  EBL:  min   BLOOD ADMINISTERED:none  DRAINS: none   LOCAL MEDICATIONS USED:  MARCAINE     SPECIMEN:  No Specimen  DISPOSITION OF SPECIMEN:  N/A  COUNTS:  YES  TOURNIQUET:   Total Tourniquet Time Documented: Thigh (Left) - 52 minutes Total: Thigh (Left) - 52 minutes   DICTATION: .Other Dictation: Dictation Number  [id number]  PLAN OF CARE: Admit to inpatient   PATIENT DISPOSITION:  PACU - hemodynamically stable.   Delay start of Pharmacological VTE agent (>24hrs) due to surgical blood loss or risk of bleeding: no

## 2017-09-10 NOTE — Anesthesia Procedure Notes (Signed)
Spinal  Patient location during procedure: OR Start time: 09/10/2017 7:38 AM End time: 09/10/2017 7:42 AM Staffing Anesthesiologist: Annye Asa, MD Resident/CRNA: Glory Buff, CRNA Performed: resident/CRNA  Preanesthetic Checklist Completed: patient identified, site marked, surgical consent, pre-op evaluation, timeout performed, IV checked, risks and benefits discussed and monitors and equipment checked Spinal Block Patient position: sitting Prep: ChloraPrep Patient monitoring: heart rate, continuous pulse ox and blood pressure Approach: midline Location: L3-4 Injection technique: single-shot Needle Needle type: Pencan  Needle gauge: 24 G Needle length: 9 cm Needle insertion depth: 6 cm Assessment Events: failed spinal Additional Notes Kit expiration date checked and verified.  - heme , - paraesthesia,  + CSF pre and post injection.  Patient tolerated well.  Minimal sensory changes to right leg and able to bend left knee after 10 minutes, Dr Jenita Seashore notified and general anesthesia induced with LMA.

## 2017-09-10 NOTE — Evaluation (Signed)
Physical Therapy Evaluation Patient Details Name: [name] Short MRN: [id number] DOB: 1946/04/15 Today's Date: 09/10/2017   History of Present Illness  Pt s/p L TKR and with hx of L lung lobectomy and COPD  Clinical Impression  Pt s/p L TKR and presents with decreased L LE strength/ROM and post op pain limiting functional mobility.  Pt should progress to dc home with family assist.    Follow Up Recommendations DC plan and follow up therapy as arranged by surgeon    Equipment Recommendations  None recommended by PT    Recommendations for Other Services OT consult     Precautions / Restrictions Precautions Precautions: Knee;Fall Required Braces or Orthoses: Knee Immobilizer - Left Knee Immobilizer - Left: Discontinue once straight leg raise with < 10 degree lag Restrictions Weight Bearing Restrictions: No      Mobility  Bed Mobility Overal bed mobility: Needs Assistance Bed Mobility: Supine to Sit     Supine to sit: Min assist     General bed mobility comments: cues for sequence and use of R LE to self assist  Transfers Overall transfer level: Needs assistance Equipment used: Rolling walker (2 wheeled) Transfers: Sit to/from Stand Sit to Stand: Min assist         General transfer comment: cues for LE management and use of UEs to self assist  Ambulation/Gait Ambulation/Gait assistance: Min assist Ambulation Distance (Feet): 45 Feet Assistive device: Rolling walker (2 wheeled) Gait Pattern/deviations: Step-to pattern;Decreased step length - right;Decreased step length - left;Shuffle;Trunk flexed Gait velocity: decr Gait velocity interpretation: Below normal speed for age/gender General Gait Details: cues for sequence, posture and position from [employer]            Wheelchair Mobility    Modified Rankin (Stroke Patients Only)       Balance                                             Pertinent Vitals/Pain Pain Assessment:  0-10 Pain Score: 4  Pain Location: L knee Pain Descriptors / Indicators: Aching;Sore Pain Intervention(s): Limited activity within patient's tolerance;Monitored during session;Premedicated before session;Ice applied    Home Living Family/patient expects to be discharged to:: Private residence Living Arrangements: Spouse/significant other Available Help at Discharge: Family Type of Home: House Home Access: Stairs to enter Entrance Stairs-Rails: Psychiatric nurse of Steps: 4 Home Layout: One level Home Equipment: Environmental consultant - 2 wheels      Prior Function Level of Independence: Independent               Hand Dominance        Extremity/Trunk Assessment   Upper Extremity Assessment Upper Extremity Assessment: Overall WFL for tasks assessed    Lower Extremity Assessment Lower Extremity Assessment: LLE deficits/detail LLE Deficits / Details: 2+/5 quads with AAROM at knee -12 - 45       Communication   Communication: No difficulties  Cognition Arousal/Alertness: Awake/alert Behavior During Therapy: WFL for tasks assessed/performed Overall Cognitive Status: Within Functional Limits for tasks assessed                                        General Comments      Exercises Total Joint Exercises Ankle Circles/Pumps: AROM;Both;15 reps;Supine Quad Sets: AROM;Both;5 reps;Supine  Heel Slides: AAROM;Left;Supine;5 reps Straight Leg Raises: AAROM;Left;10 reps;Supine   Assessment/Plan    PT Assessment Patient needs continued PT services  PT Problem List Decreased strength;Decreased range of motion;Decreased activity tolerance;Decreased mobility;Decreased knowledge of use of DME;Pain       PT Treatment Interventions DME instruction;Gait training;Stair training;Functional mobility training;Therapeutic exercise;Therapeutic activities;Patient/family education    PT Goals (Current goals can be found in the Care Plan section)  Acute Rehab PT  Goals Patient Stated Goal: Regain IND PT Goal Formulation: With patient Time For Goal Achievement: 09/17/17 Potential to Achieve Goals: Good    Frequency 7X/week   Barriers to discharge        Co-evaluation               AM-PAC PT "6 Clicks" Daily Activity  Outcome Measure Difficulty turning over in bed (including adjusting bedclothes, sheets and blankets)?: Unable Difficulty moving from lying on back to sitting on the side of the bed? : Unable Difficulty sitting down on and standing up from a chair with arms (e.g., wheelchair, bedside commode, etc,.)?: Unable Help needed moving to and from a bed to chair (including a wheelchair)?: A Little Help needed walking in hospital room?: A Little Help needed climbing 3-5 steps with a railing? : A Little 6 Click Score: 12    End of Session Equipment Utilized During Treatment: Gait belt;Left knee immobilizer Activity Tolerance: Patient tolerated treatment well Patient left: in chair;with call bell/phone within reach;with family/visitor present Nurse Communication: Mobility status PT Visit Diagnosis: Difficulty in walking, not elsewhere classified (R26.2)    Time: 1610-9604 PT Time Calculation (min) (ACUTE ONLY): 40 min   Charges:   PT Evaluation $PT Eval Low Complexity: 1 Low PT Treatments $Gait Training: 8-22 mins $Therapeutic Exercise: 8-22 mins   PT G Codes:        Pg [phone]   Olga Seyler 09/10/2017, 5:54 PM

## 2017-09-10 NOTE — Anesthesia Postprocedure Evaluation (Signed)
Anesthesia Post Note  Patient: ZYRELL CARMEAN  Procedure(s) Performed: LEFT TOTAL KNEE ARTHROPLASTY (Left Knee)     Patient location during evaluation: PACU Anesthesia Type: General and Regional Level of consciousness: oriented, awake and alert and awake Pain management: pain level not controlled Vital Signs Assessment: post-procedure vital signs reviewed and stable Respiratory status: spontaneous breathing, nonlabored ventilation, respiratory function stable and patient connected to nasal cannula oxygen Cardiovascular status: blood pressure returned to baseline and stable Postop Assessment: no apparent nausea or vomiting, patient able to bend at knees and spinal receding Anesthetic complications: no    Last Vitals:  Vitals:   09/10/17 1215 09/10/17 1239  BP: 122/71 122/72  Pulse: (!) 104 (!) 105  Resp: 16 16  Temp: 36.5 C 36.5 C  SpO2: 93% 95%    Last Pain:  Vitals:   09/10/17 1215  TempSrc:   PainSc: Asleep                 Nyaisha Simao,E. Ronn Smolinsky

## 2017-09-10 NOTE — Anesthesia Procedure Notes (Signed)
Anesthesia Regional Block: Adductor canal block   Pre-Anesthetic Checklist: ,, timeout performed, Correct Patient, Correct Site, Correct Laterality, Correct Procedure, Correct Position, site marked, Risks and benefits discussed,  Surgical consent,  Pre-op evaluation,  At surgeon's request and post-op pain management  Laterality: Left and Lower  Prep: chloraprep       Needles:  Injection technique: Single-shot  Needle Type: Echogenic Needle     Needle Length: 9cm  Needle Gauge: 21     Additional Needles:   Procedures:,,,, ultrasound used (permanent image in chart),,,,  Narrative:  Start time: 09/10/2017 7:06 AM End time: 09/10/2017 7:13 AM Injection made incrementally with aspirations every 5 mL.  Performed by: Personally  Anesthesiologist: Annye Asa, MD  Additional Notes: Pt identified in Holding room.  Monitors applied. Working IV access confirmed. Sterile prep, drape L thigh.  #21ga ECHOgenic needle into adductor canal with US guidance.  20cc 0.75% Ropivacaine injected incrementally after negative test dose.  Patient asymptomatic, VSS, no heme aspirated, tolerated well.  Jenita Seashore, MD

## 2017-09-10 NOTE — Interval H&P Note (Signed)
History and Physical Interval Note:  09/10/2017 7:28 AM  [name] Short  has presented today for surgery, with the diagnosis of Degenerative joint disease left knee  The various methods of treatment have been discussed with the patient and family. After consideration of risks, benefits and other options for treatment, the patient has consented to  Procedure(s) with comments: LEFT TOTAL KNEE ARTHROPLASTY (Left) - 120 mins as a surgical intervention .  The patient's history has been reviewed, patient examined, no change in status, stable for surgery.  I have reviewed the patient's chart and labs.  Questions were answered to the patient's satisfaction.     Kc Sedlak C

## 2017-09-11 LAB — CBC
HCT: 33.7 % — ABNORMAL LOW (ref 39.0–52.0)
Hemoglobin: 11.6 g/dL — ABNORMAL LOW (ref 13.0–17.0)
MCH: 32 pg (ref 26.0–34.0)
MCHC: 34.4 g/dL (ref 30.0–36.0)
MCV: 92.8 fL (ref 78.0–100.0)
Platelets: 290 K/uL (ref 150–400)
RBC: 3.63 MIL/uL — ABNORMAL LOW (ref 4.22–5.81)
RDW: 13 % (ref 11.5–15.5)
WBC: 13.6 K/uL — ABNORMAL HIGH (ref 4.0–10.5)

## 2017-09-11 LAB — BASIC METABOLIC PANEL WITH GFR
Anion gap: 8 (ref 5–15)
BUN: 25 mg/dL — ABNORMAL HIGH (ref 6–20)
CO2: 26 mmol/L (ref 22–32)
Calcium: 8.8 mg/dL — ABNORMAL LOW (ref 8.9–10.3)
Chloride: 103 mmol/L (ref 101–111)
Creatinine, Ser: 1.13 mg/dL (ref 0.61–1.24)
GFR calc Af Amer: >60 mL/min (ref >60)
GFR calc non Af Amer: >60 mL/min (ref >60)
Glucose, Bld: 115 mg/dL — ABNORMAL HIGH (ref 65–99)
Potassium: 4.2 mmol/L (ref 3.5–5.1)
Sodium: 137 mmol/L (ref 135–145)

## 2017-09-11 NOTE — Progress Notes (Signed)
CSW consult-SNF  Plan at D/C: Patient to follow up therapy as arranged by surgeon.   No CSW needs identified. CSW signing off.   Kathrin Greathouse, Latanya Presser, MSW Clinical Social Worker  [phone] 09/11/2017  10:01 AM

## 2017-09-11 NOTE — Progress Notes (Signed)
Physical Therapy Treatment Patient Details Name: [name] Short MRN: [id number] DOB: [birth year] Today's Date: 09/11/2017    History of Present Illness Pt s/p L TKR and with hx of L lung lobectomy and COPD    PT Comments    POD # 1 am session Applied KI and instructed on use.  Assisted with amb in hallway then back to bed to perform TKR TE's followed by ICE.    Follow Up Recommendations  DC plan and follow up therapy as arranged by surgeon     Equipment Recommendations  None recommended by PT    Recommendations for Other Services       Precautions / Restrictions Precautions Precautions: Knee;Fall Precaution Comments: Instructed on KI use  Required Braces or Orthoses: Knee Immobilizer - Left Knee Immobilizer - Left: Discontinue once straight leg raise with < 10 degree lag Restrictions Weight Bearing Restrictions: No    Mobility  Bed Mobility Overal bed mobility: Needs Assistance Bed Mobility: Supine to Sit;Sit to Supine     Supine to sit: Min guard;Min assist Sit to supine: Min guard;Min assist   General bed mobility comments: assist for LLE and increased time  Transfers Overall transfer level: Needs assistance Equipment used: Rolling walker (2 wheeled) Transfers: Sit to/from Stand Sit to Stand: Min guard         General transfer comment:   for safety; cues for UE/LE placement  Ambulation/Gait Ambulation/Gait assistance: Min assist Ambulation Distance (Feet): 50 Feet Assistive device: Rolling walker (2 wheeled) Gait Pattern/deviations: Step-to pattern;Decreased step length - right;Decreased step length - left;Shuffle;Trunk flexed Gait velocity: decr   General Gait Details: cues for sequence, posture and position from [employer]            Wheelchair Mobility    Modified Rankin (Stroke Patients Only)       Balance                                            Cognition Arousal/Alertness: Awake/alert Behavior During  Therapy: WFL for tasks assessed/performed Overall Cognitive Status: Within Functional Limits for tasks assessed                                        Exercises   Total Knee Replacement TE's 10 reps B LE ankle pumps 10 reps towel squeezes 10 reps knee presses 10 reps heel slides  10 reps SAQ's 10 reps SLR's 10 reps ABD Followed by ICE     General Comments        Pertinent Vitals/Pain Pain Assessment: No/denies pain Pain Score: 4  Pain Location: L knee Pain Descriptors / Indicators: Aching;Sore Pain Intervention(s): Monitored during session;Repositioned;Ice applied    Home Living Family/patient expects to be discharged to:: Private residence Living Arrangements: Spouse/significant other Available Help at Discharge: Family         Home Equipment: Gilford Rile - 2 wheels      Prior Function Level of Independence: Independent          PT Goals (current goals can now be found in the care plan section) Acute Rehab PT Goals Patient Stated Goal: Regain IND Progress towards PT goals: Progressing toward goals    Frequency    7X/week      PT Plan Current plan remains appropriate  Co-evaluation              AM-PAC PT "6 Clicks" Daily Activity  Outcome Measure  Difficulty turning over in bed (including adjusting bedclothes, sheets and blankets)?: Unable Difficulty moving from lying on back to sitting on the side of the bed? : Unable Difficulty sitting down on and standing up from a chair with arms (e.g., wheelchair, bedside commode, etc,.)?: Unable Help needed moving to and from a bed to chair (including a wheelchair)?: A Little Help needed walking in hospital room?: A Little Help needed climbing 3-5 steps with a railing? : A Little 6 Click Score: 12    End of Session Equipment Utilized During Treatment: Gait belt;Left knee immobilizer Activity Tolerance: Patient tolerated treatment well Patient left: in bed;with call bell/phone within  reach;with family/visitor present Nurse Communication: Mobility status PT Visit Diagnosis: Difficulty in walking, not elsewhere classified (R26.2)     Time: 4356-8616 PT Time Calculation (min) (ACUTE ONLY): 23 min  Charges:  $Gait Training: 8-22 mins $Therapeutic Exercise: 8-22 mins                    G Codes:       [name]  PTA [facility]  Acute  Rehab Pager      [phone]

## 2017-09-11 NOTE — Progress Notes (Signed)
Subjective: 1 Day Post-Op Procedure(s) (LRB): LEFT TOTAL KNEE ARTHROPLASTY (Left) Patient reports pain as 4 on 0-10 scale.    Objective: Vital signs in last 24 hours: Temp:  [97.6 F (36.4 C)-98.6 F (37 C)] 97.6 F (36.4 C) (12/28 1326) Pulse Rate:  [104-114] 114 (12/28 1326) Resp:  [15-20] 20 (12/28 1326) BP: (130-155)/(66-91) 155/91 (12/28 1326) SpO2:  [93 %-100 %] 93 % (12/28 1326)  Intake/Output from previous day: 12/27 0701 - 12/28 0700 In: 4672.9 [P.O.:1260; I.V.:2847.9; IV Piggyback:565] Out: 7062 [BJSEG:3151; Blood:50] Intake/Output this shift: Total I/O In: 395 [I.V.:395] Out: 225 [Urine:225]  Recent Labs    09/11/17 0520  HGB 11.6*   Recent Labs    09/11/17 0520  WBC 13.6*  RBC 3.63*  HCT 33.7*  PLT 290   Recent Labs    09/11/17 0520  NA 137  K 4.2  CL 103  CO2 26  BUN 25*  CREATININE 1.13  GLUCOSE 115*  CALCIUM 8.8*   No results for input(s): LABPT, INR in the last 72 hours.  Neurologically intact Neurovascular intact Sensation intact distally Intact pulses distally Dorsiflexion/Plantar flexion intact Compartment soft  Assessment/Plan: 1 Day Post-Op Procedure(s) (LRB): LEFT TOTAL KNEE ARTHROPLASTY (Left) Advance diet Discharge home with home health tomorrow   H[place]ory C 09/11/2017, 2:44 PM

## 2017-09-11 NOTE — Progress Notes (Signed)
Physical Therapy Treatment Patient Details Name: [name] Short MRN: [id number] DOB: [birth year] Today's Date: 09/11/2017    History of Present Illness Pt s/p L TKR and with hx of L lung lobectomy and COPD    PT Comments    POD # 1 pm session Applied KI and assisted with amb a greater distance in hallway then back to bed followed by ICE. Pt plans to D/C to home tomorrow.   Follow Up Recommendations  DC plan and follow up therapy as arranged by surgeon     Equipment Recommendations  None recommended by PT    Recommendations for Other Services       Precautions / Restrictions Precautions Precautions: Knee;Fall Precaution Comments: Instructed on KI use  Required Braces or Orthoses: Knee Immobilizer - Left Knee Immobilizer - Left: Discontinue once straight leg raise with < 10 degree lag Restrictions Weight Bearing Restrictions: No    Mobility  Bed Mobility Overal bed mobility: Needs Assistance Bed Mobility: Supine to Sit;Sit to Supine     Supine to sit: Min guard;Min assist Sit to supine: Min guard;Min assist   General bed mobility comments: assist for LLE and increased time  Transfers Overall transfer level: Needs assistance Equipment used: Rolling walker (2 wheeled) Transfers: Sit to/from Stand Sit to Stand: Min guard         General transfer comment:   for safety; cues for UE/LE placement  Ambulation/Gait Ambulation/Gait assistance: Min assist Ambulation Distance (Feet): 75 Feet Assistive device: Rolling walker (2 wheeled) Gait Pattern/deviations: Step-to pattern;Decreased step length - right;Decreased step length - left;Shuffle;Trunk flexed Gait velocity: decr   General Gait Details: cues for sequence, posture and position from [employer]            Wheelchair Mobility    Modified Rankin (Stroke Patients Only)       Balance                                            Cognition Arousal/Alertness:  Awake/alert Behavior During Therapy: WFL for tasks assessed/performed Overall Cognitive Status: Within Functional Limits for tasks assessed                                        Exercises      General Comments        Pertinent Vitals/Pain Pain Assessment: No/denies pain Pain Score: 4  Pain Location: L knee Pain Descriptors / Indicators: Aching;Sore Pain Intervention(s): Monitored during session;Repositioned;Ice applied    Home Living Family/patient expects to be discharged to:: Private residence Living Arrangements: Spouse/significant other Available Help at Discharge: Family         Home Equipment: Gilford Rile - 2 wheels      Prior Function Level of Independence: Independent          PT Goals (current goals can now be found in the care plan section) Acute Rehab PT Goals Patient Stated Goal: Regain IND Progress towards PT goals: Progressing toward goals    Frequency    7X/week      PT Plan Current plan remains appropriate    Co-evaluation              AM-PAC PT "6 Clicks" Daily Activity  Outcome Measure  Difficulty turning over in bed (including adjusting bedclothes,  sheets and blankets)?: Unable Difficulty moving from lying on back to sitting on the side of the bed? : Unable Difficulty sitting down on and standing up from a chair with arms (e.g., wheelchair, bedside commode, etc,.)?: Unable Help needed moving to and from a bed to chair (including a wheelchair)?: A Little Help needed walking in hospital room?: A Little Help needed climbing 3-5 steps with a railing? : A Little 6 Click Score: 12    End of Session Equipment Utilized During Treatment: Gait belt;Left knee immobilizer Activity Tolerance: Patient tolerated treatment well Patient left: in bed;with call bell/phone within reach;with family/visitor present Nurse Communication: Mobility status PT Visit Diagnosis: Difficulty in walking, not elsewhere classified (R26.2)      Time: 1350-1405 PT Time Calculation (min) (ACUTE ONLY): 15 min  Charges:  $Gait Training: 8-22 mins                     G Codes:       [name]  PTA [facility]  Acute  Rehab Pager      [phone]

## 2017-09-11 NOTE — Evaluation (Signed)
Occupational Therapy Evaluation Patient Details Name: [name] Short MRN: [id number] DOB: 1946-08-11 Today's Date: 09/11/2017    History of Present Illness Pt s/p L TKR and with hx of L lung lobectomy and COPD   Clinical Impression   Pt was admitted for the above. All education was completed. No further OT is needed at this time     Follow Up Recommendations  Supervision/Assistance - 24 hour    Equipment Recommendations  3 in 1 bedside commode    Recommendations for Other Services       Precautions / Restrictions Precautions Precautions: Knee;Fall Required Braces or Orthoses: Knee Immobilizer - Left Knee Immobilizer - Left: Discontinue once straight leg raise with < 10 degree lag Restrictions Weight Bearing Restrictions: No      Mobility Bed Mobility         Supine to sit: Min assist     General bed mobility comments: assist for LLE  Transfers   Equipment used: Rolling walker (2 wheeled)   Sit to Stand: Min guard         General transfer comment:   for safety; cues for UE/LE placement    Balance                                           ADL either performed or assessed with clinical judgement   ADL Overall ADL's : Needs assistance/impaired                         Toilet Transfer: Min guard;Ambulation;RW Toilet Transfer Details (indicate cue type and reason): used grab bar and comfort height commode for one trial and BSC over toilet for second           General ADL Comments: wife will assist with adls as needed.  She feels comfortable donning KI. Overall pt needs set up for UB and mod A for LB.  Reviewed tub readiness     Vision         Perception     Praxis      Pertinent Vitals/Pain Pain Score: 4  Pain Location: L knee Pain Descriptors / Indicators: Aching;Sore Pain Intervention(s): Limited activity within patient's tolerance;Monitored during session;Premedicated before session;Repositioned;Ice applied      Hand Dominance     Extremity/Trunk Assessment Upper Extremity Assessment Upper Extremity Assessment: Overall WFL for tasks assessed           Communication Communication Communication: No difficulties   Cognition Arousal/Alertness: Awake/alert Behavior During Therapy: WFL for tasks assessed/performed Overall Cognitive Status: Within Functional Limits for tasks assessed                                     General Comments       Exercises     Shoulder Instructions      Home Living Family/patient expects to be discharged to:: Private residence Living Arrangements: Spouse/significant other Available Help at Discharge: Family               Bathroom Shower/Tub: Teacher, early years/pre: Standard     Home Equipment: Environmental consultant - 2 wheels          Prior Functioning/Environment Level of Independence: Independent  OT Problem List:        OT Treatment/Interventions:      OT Goals(Current goals can be found in the care plan section) Acute Rehab OT Goals Patient Stated Goal: Regain IND OT Goal Formulation: All assessment and education complete, DC therapy  OT Frequency:     Barriers to D/C:            Co-evaluation              AM-PAC PT "6 Clicks" Daily Activity     Outcome Measure Help from another person eating meals?: None Help from another person taking care of personal grooming?: A Little Help from another person toileting, which includes using toliet, bedpan, or urinal?: A Little Help from another person bathing (including washing, rinsing, drying)?: A Lot Help from another person to put on and taking off regular upper body clothing?: A Little Help from another person to put on and taking off regular lower body clothing?: A Lot 6 Click Score: 17   End of Session    Activity Tolerance: Patient tolerated treatment well Patient left: in bed;with call bell/phone within reach;with family/visitor  present  OT Visit Diagnosis: Pain Pain - Right/Left: Left Pain - part of body: Knee                Time: 2263-3354 OT Time Calculation (min): 21 min Charges:  OT General Charges $OT Visit: 1 Visit OT Evaluation $OT Eval Low Complexity: 1 Low G-Codes:     [name] Short, [name] Short [phone] 09/11/2017  [name] Short 09/11/2017, 12:59 PM

## 2017-09-11 NOTE — Op Note (Signed)
NAME:  JAYDEEN, ODOR                 ACCOUNT NO.:  [id number]  MEDICAL RECORD NO.:  [id number]  LOCATION:                                 FACILITY:  PHYSICIAN:  Susa Day, M.D.         DATE OF BIRTH:  DATE OF PROCEDURE:  09/10/2017 DATE OF DISCHARGE:                              OPERATIVE REPORT   PREOPERATIVE DIAGNOSIS:  Degenerative joint disease, left knee, with varus deformity.  POSTOPERATIVE DIAGNOSIS:  Degenerative joint disease, left knee, with varus deformity.  PROCEDURE PERFORMED:  Left total knee arthroplasty utilizing the DePuy Attune rotating platform, 6 femur, 6 tibia, 6 insert, 41 patella.  ANESTHESIA:  General.  ASSISTANT:  Lacie Draft, PA.  HISTORY:  A 71, bone-on-bone arthrosis, medial compartment, patellofemoral joint, refractory to conservative treatment including rest, activity modification, analgesics, and injections.  He was indicated for replacement of the degenerated joint.  Risks and benefits discussed including bleeding, infection, damage to neurovascular structures, suboptimal range of motion, DVT, PE, anesthetic complications, need for manipulation, hardware failure, etc.  TECHNIQUE:  With the patient in supine position, after induction of adequate general anesthesia, 2 g Kefzol, the left lower extremity was prepped, draped, and exsanguinated in usual sterile fashion.  Thigh tourniquet inflated at 250 mmHg.  Midline incision was made over the knee.  Full-thickness flaps developed.  He had a prepatellar bursa that was inflamed and excised.  Median parapatellar arthrotomy was performed. Patella everted.  Knee flexed.  Clear synovial fluid evacuated. Remnants of medial and lateral menisci removed as well as the ACL.  He had grade 4 changes in medial compartment, patellofemoral joint. Osteophytes removed with a rongeur.  Step drill utilized to enter the femoral canal, was irrigated, 5-degree left was selected, with 9 off the distal femur even  with a slight flexion contracture.  It was then pinned and performed our distal femoral cut.  It was then sized off the anterior cortex to a 6 and 3 degrees of external rotation, it was pinned, used our cutting block and performed the anterior, posterior, and chamfer cuts without notching the cortex anteriorly.  Soft tissue protected posteriorly at all times.  Tibia subluxed.  External alignment guide, 4 off the defect, which was medial, which was 10 off the lateral side.  Bisecting tibiotalar joint parallel to the shaft, 3-degree slope, pinned, performed our cut.  Extension block was placed and we had full extension.  We then turned our attention to the tibia, subluxed, measured to a 6 by our base plate just at the medial aspect of the tibial tubercle, pinned.  We harvested bone from the canal, centrally drilled it, used our punch guide, turned our attention to the femur, used our box cut, bisecting the femur, performed our cut, placed our trial femur, drilled our lug holes.  Placed a 6 insert and reduced the knee.  It had full extension, full flexion, good stability, varus and valgus stressing 0 to 30 degrees, negative anterior drawer.  Patella everted, measured to a 24, planed to a 15 utilizing the patellar jig. The residual was a 15, measured to a 41, drilled our PEG holes medializing them.  Reduced the  patella and had excellent patellofemoral tracking.  Negative anterior drawer.  All components were removed.  We checked posteriorly.  Osteophytes removed with a rongeur.  Protecting soft tissues at all time with a curved Crego.  Cauterized the geniculate.  Popliteus was intact as was the capsule.  Pulsatile lavage in the joint.  We mixed cement on the back table in appropriate fashion. Flexed the knee.  Dried all surfaces thoroughly.  Injected cement, digitally pressurized in the proximal tibia.  I then impacted the tibial tray.  Redundant cement removed.  We cemented and impacted the  femur, redundant cement removed.  We cemented and clamped the patella.  Knees held an axial load in extension throughout the curing of the cement. Redundant cement removed.  After curing of the cement, tourniquet was deflated at 52 minutes.  Minimal bleeding, cauterized any bleeding. Flexed the knee and meticulously removed all redundant cement.  Removed the trial, checked posteriorly, cauterized geniculates, irrigated it, pulsatile lavage, antibiotic irrigation, placed a 6 insert permanent, reduced it, had full flexion, good extension, good stability, varus and valgus stressing 0 to 30 degrees, negative anterior drawer.  Next, repaired the patellar arthrotomy with 1 Vicryl and then oversewed with a running V-Loc, had flexion to gravity 90 degrees and excellent patellofemoral tracking after this.  Subcu with 2-0 and skin with staples.  Wound was dressed sterilely, placed in immobilizer, extubated without difficulty, and transported to the recovery room in satisfactory condition.  The patient tolerated the procedure well.  There were no complications. Assistant, Lacie Draft, [place].  Minimal blood loss.     Susa Day, M.D.   ______________________________ Susa Day, M.D.    Geralynn Rile  D:  09/10/2017  T:  09/11/2017  Job:  [id number]

## 2017-09-12 LAB — CBC
HCT: 33.6 % — ABNORMAL LOW (ref 39.0–52.0)
HEMOGLOBIN: 11.7 g/dL — AB (ref 13.0–17.0)
MCH: 32 pg (ref 26.0–34.0)
MCHC: 34.8 g/dL (ref 30.0–36.0)
MCV: 91.8 fL (ref 78.0–100.0)
PLATELETS: 273 10*3/uL (ref 150–400)
RBC: 3.66 MIL/uL — AB (ref 4.22–5.81)
RDW: 12.8 % (ref 11.5–15.5)
WBC: 12.6 10*3/uL — ABNORMAL HIGH (ref 4.0–10.5)

## 2017-09-12 NOTE — Care Management Note (Signed)
71 yo M s/p L TKR. Received referral to assist with [facility] PT and DME.  Per RN, pt doesn't need HHPT. Outpt rehab was ordered.  Met with pt and wife. D/C plan is to return home with the support of his wife. He needs a RW and a 3-in-1 BSC. Contacted Reggie at [facility] for referral. DME delivered.

## 2017-09-12 NOTE — Progress Notes (Signed)
Physical Therapy Treatment Patient Details Name: [name] Short MRN: [id number] DOB: 1945-12-28 Today's Date: 09/12/2017    History of Present Illness Pt s/p L TKR and with hx of L lung lobectomy and COPD    PT Comments    POD # 2 am session Spouse present for "hands on" instruction on transfers, gait, stairs and HEP.  Pt and spouse instructed on use of KI for stairs.  Instructed on use of ICE. All mobility questions addressed.  Pt ready for D/C to home.   Follow Up Recommendations  DC plan and follow up therapy as arranged by surgeon     Equipment Recommendations  None recommended by PT    Recommendations for Other Services       Precautions / Restrictions Precautions Precautions: Knee;Fall Precaution Comments: Instructed on KI use esp for stairs Required Braces or Orthoses: Knee Immobilizer - Left Knee Immobilizer - Left: Discontinue once straight leg raise with < 10 degree lag Restrictions Weight Bearing Restrictions: No Other Position/Activity Restrictions: WBAT    Mobility  Bed Mobility Overal bed mobility: Needs Assistance Bed Mobility: Supine to Sit     Supine to sit: Min guard;Min assist     General bed mobility comments: assist for LLE and increased time  Transfers Overall transfer level: Needs assistance Equipment used: Rolling walker (2 wheeled) Transfers: Sit to/from Stand Sit to Stand: Min guard;Supervision         General transfer comment:   for safety; cues for UE/LE placement  Ambulation/Gait Ambulation/Gait assistance: Supervision;Min guard Ambulation Distance (Feet): 68 Feet Assistive device: Rolling walker (2 wheeled) Gait Pattern/deviations: Step-to pattern;Decreased step length - right;Decreased step length - left;Shuffle;Trunk flexed Gait velocity: decr   General Gait Details: cues for sequence, posture and position from RW with spouse present for "hands on" instruction   Stairs Stairs: Yes   Stair Management: One rail  Right;Step to pattern;Forwards;With crutches Number of Stairs: 4 General stair comments: with spouse present for "hands on" instruction practiced 4 steps one rail/one crutch  Wheelchair Mobility    Modified Rankin (Stroke Patients Only)       Balance                                            Cognition Arousal/Alertness: Awake/alert Behavior During Therapy: WFL for tasks assessed/performed Overall Cognitive Status: Within Functional Limits for tasks assessed                                        Exercises   Total Knee Replacement TE's 10 reps B LE ankle pumps 10 reps towel squeezes 10 reps knee presses 10 reps heel slides  10 reps SAQ's 10 reps SLR's 10 reps ABD Followed by ICE     General Comments        Pertinent Vitals/Pain Pain Assessment: 0-10 Pain Score: 5  Pain Location: L knee Pain Descriptors / Indicators: Aching;Sore;Operative site guarding Pain Intervention(s): Monitored during session;Premedicated before session;Ice applied;Repositioned    Home Living                      Prior Function            PT Goals (current goals can now be found in the care plan section) Progress towards PT goals:  Progressing toward goals    Frequency    7X/week      PT Plan Current plan remains appropriate    Co-evaluation              AM-PAC PT "6 Clicks" Daily Activity  Outcome Measure  Difficulty turning over in bed (including adjusting bedclothes, sheets and blankets)?: Unable Difficulty moving from lying on back to sitting on the side of the bed? : Unable Difficulty sitting down on and standing up from a chair with arms (e.g., wheelchair, bedside commode, etc,.)?: Unable Help needed moving to and from a bed to chair (including a wheelchair)?: A Little Help needed walking in hospital room?: A Little Help needed climbing 3-5 steps with a railing? : A Little 6 Click Score: 12    End of Session  Equipment Utilized During Treatment: Gait belt;Left knee immobilizer Activity Tolerance: Patient tolerated treatment well Patient left: in chair;with call bell/phone within reach;with family/visitor present Nurse Communication: (pt ready for D/C to home) PT Visit Diagnosis: Difficulty in walking, not elsewhere classified (R26.2)     Time: 5396-7289 PT Time Calculation (min) (ACUTE ONLY): 28 min  Charges:  $Gait Training: 8-22 mins $Therapeutic Exercise: 8-22 mins                    G Codes:       {Jabin Tapp  PTA [facility]  Acute  Rehab Pager      [phone]

## 2017-09-12 NOTE — Progress Notes (Signed)
Nurse reviewed discharge instructions with pt.  Pt verbalized understanding of discharge instructions, follow up appointments and new medications.  Prescriptions given to pt and equipment delivered to pt prior to discharge.

## 2017-09-12 NOTE — Progress Notes (Signed)
Subjective: 2 Days Post-Op Procedure(s) (LRB): LEFT TOTAL KNEE ARTHROPLASTY (Left) Patient reports pain as mild.  Tolerating PO's. Progress with PT. Denies CP,SOB,OR calf pain.  Objective: Vital signs in last 24 hours: Temp:  [97.6 F (36.4 C)-98.6 F (37 C)] 98.4 F (36.9 C) (12/29 0557) Pulse Rate:  [108-116] 114 (12/29 0557) Resp:  [20] 20 (12/29 0557) BP: (134-155)/(83-91) 134/83 (12/29 0557) SpO2:  [92 %-95 %] 95 % (12/29 0557)  Intake/Output from previous day: 12/28 0701 - 12/29 0700 In: 635 [P.O.:240; I.V.:395] Out: 1040 [Urine:1040] Intake/Output this shift: No intake/output data recorded.  Recent Labs    09/11/17 0520 09/12/17 0617  HGB 11.6* 11.7*   Recent Labs    09/11/17 0520 09/12/17 0617  WBC 13.6* 12.6*  RBC 3.63* 3.66*  HCT 33.7* 33.6*  PLT 290 273   Recent Labs    09/11/17 0520  NA 137  K 4.2  CL 103  CO2 26  BUN 25*  CREATININE 1.13  GLUCOSE 115*  CALCIUM 8.8*   No results for input(s): LABPT, INR in the last 72 hours.  Alert and oriented x3. RRR, Lungs clear, BS x4. Left Calf soft and non tender. L knee dressing C/D/I. No DVT signs. No signs of infection or compartment syndrome. LLE grossly neurovascularly intact.   Assessment/Plan: 2 Days Post-Op Procedure(s) (LRB): LEFT TOTAL KNEE ARTHROPLASTY (Left) Up with PT D/c home  Continue current care Follow instructions F/u in office  Keiley Levey L 09/12/2017, 8:38 AM

## 2017-09-13 NOTE — Discharge Summary (Signed)
Physician Discharge Summary  Patient ID: [name] Short MRN: [id number] DOB/AGE: October 19, 1945 71 y.o.  Admit date: 09/10/2017 Discharge date: 09/13/2017  Admission Diagnoses: Left knee OA  Discharge Diagnoses:  Active Problems:   Left knee DJD   Discharged Condition: good  Hospital Course:  [name] Short is a 71 y.o. who was admitted to [facility] They were brought to the operating room on 09/10/2017 and underwent Procedure(s): LEFT TOTAL KNEE ARTHROPLASTY.  Patient tolerated the procedure well and was later transferred to the recovery room and then to the orthopaedic floor for postoperative care.  They were given PO and IV analgesics for pain control following their surgery.  They were given 24 hours of postoperative antibiotics of  Anti-infectives (From admission, onward)   Start     Dose/Rate Route Frequency Ordered Stop   09/10/17 1400  ceFAZolin (ANCEF) IVPB 2g/100 mL premix     2 g 200 mL/hr over 30 Minutes Intravenous Every 6 hours 09/10/17 1231 09/11/17 0635   09/10/17 0821  polymyxin B 500,000 Units, bacitracin 50,000 Units in sodium chloride 0.9 % 500 mL irrigation  Status:  Discontinued       As needed 09/10/17 0821 09/10/17 0939   09/10/17 0644  ceFAZolin (ANCEF) 2-4 GM/100ML-% IVPB    Comments:  Carleene Cooper   : cabinet override      09/10/17 0644 09/10/17 0743   09/10/17 0552  ceFAZolin (ANCEF) IVPB 2g/100 mL premix     2 g 200 mL/hr over 30 Minutes Intravenous On call to O.R. 09/10/17 5366 09/10/17 0813     and started on DVT prophylaxis in the form of lovenox.   PT and OT were ordered for total joint protocol.  Discharge planning consulted to help with postop disposition and equipment needs.  Patient had a good night on the evening of surgery and started to get up OOB with therapy on day one.  Hemovac drain was pulled without difficulty.  Continued to work with therapy into day two.  Dressing was with normal limits.  The patient had progressed with therapy  and meeting their goals. Patient was seen in rounds and was ready to go home.  Consults:n/a  Significant Diagnostic Studies: routine  Treatments: routine  Discharge Exam: Blood pressure 134/83, pulse (!) 114, temperature 98.4 F (36.9 C), temperature source Oral, resp. rate 20, height 5\' 5"  (1.651 m), weight 86.6 kg (191 lb), SpO2 95 %. Alert and oriented x3. RRR, Lungs clear, BS x4. Left Calf soft and non tender. L knee dressing C/D/I. No DVT signs. No signs of infection or compartment syndrome. LLE grossly neurovascularly intact.   Disposition: 01-Home or Self Care   Allergies as of 09/12/2017   No Known Allergies     Medication List    STOP taking these medications   Red Yeast Rice 600 MG Caps     TAKE these medications   acetaminophen 650 MG CR tablet Commonly known as:  TYLENOL Take 1,300 mg by mouth every morning. Reported on 02/06/2016   aspirin EC 325 MG tablet Take 1 tablet (325 mg total) by mouth 2 (two) times daily.   chlorthalidone 25 MG tablet Commonly known as:  HYGROTON Take 25 mg by mouth daily.   docusate sodium 100 MG capsule Commonly known as:  COLACE Take 1 capsule (100 mg total) by mouth 2 (two) times daily as needed for mild constipation.   losartan 100 MG tablet Commonly known as:  COZAAR Take 100 mg by mouth daily.  NIFEdipine 90 MG 24 hr tablet Commonly known as:  PROCARDIA XL/ADALAT-CC Take 90 mg by mouth daily.   omeprazole 20 MG capsule Commonly known as:  PRILOSEC Take 20 mg by mouth daily.   oxyCODONE-acetaminophen 5-325 MG tablet Commonly known as:  PERCOCET Take 1 tablet by mouth every 4 (four) hours as needed.   polyethylene glycol packet Commonly known as:  MIRALAX / GLYCOLAX Take 17 g by mouth daily.      Follow-up Information    Susa Day, MD Follow up in 2 week(s).   Specialty:  Orthopedic Surgery Contact information: [address] [place] [address] [phone]            Signed: Lajean Manes 09/13/2017, 2:05 PM

## 2017-09-17 DIAGNOSIS — M25569 Pain in unspecified knee: Secondary | ICD-10-CM | POA: Diagnosis not present

## 2017-09-21 DIAGNOSIS — M25562 Pain in left knee: Secondary | ICD-10-CM | POA: Diagnosis not present

## 2017-09-23 DIAGNOSIS — M25562 Pain in left knee: Secondary | ICD-10-CM | POA: Diagnosis not present

## 2017-09-24 DIAGNOSIS — Z96652 Presence of left artificial knee joint: Secondary | ICD-10-CM | POA: Diagnosis not present

## 2017-09-24 DIAGNOSIS — Z4889 Encounter for other specified surgical aftercare: Secondary | ICD-10-CM | POA: Diagnosis not present

## 2017-09-25 DIAGNOSIS — M25562 Pain in left knee: Secondary | ICD-10-CM | POA: Diagnosis not present

## 2017-09-29 DIAGNOSIS — M25562 Pain in left knee: Secondary | ICD-10-CM | POA: Diagnosis not present

## 2017-10-01 DIAGNOSIS — M25562 Pain in left knee: Secondary | ICD-10-CM | POA: Diagnosis not present

## 2017-10-06 DIAGNOSIS — M25562 Pain in left knee: Secondary | ICD-10-CM | POA: Diagnosis not present

## 2017-10-08 DIAGNOSIS — M25562 Pain in left knee: Secondary | ICD-10-CM | POA: Diagnosis not present

## 2017-10-13 DIAGNOSIS — M25562 Pain in left knee: Secondary | ICD-10-CM | POA: Diagnosis not present

## 2017-10-15 DIAGNOSIS — M25562 Pain in left knee: Secondary | ICD-10-CM | POA: Diagnosis not present

## 2017-10-20 DIAGNOSIS — M25562 Pain in left knee: Secondary | ICD-10-CM | POA: Diagnosis not present

## 2017-10-22 DIAGNOSIS — M25562 Pain in left knee: Secondary | ICD-10-CM | POA: Diagnosis not present

## 2017-10-27 DIAGNOSIS — M25562 Pain in left knee: Secondary | ICD-10-CM | POA: Diagnosis not present

## 2017-10-27 DIAGNOSIS — Z96652 Presence of left artificial knee joint: Secondary | ICD-10-CM | POA: Diagnosis not present

## 2017-10-27 DIAGNOSIS — Z4889 Encounter for other specified surgical aftercare: Secondary | ICD-10-CM | POA: Diagnosis not present

## 2017-10-29 DIAGNOSIS — M25562 Pain in left knee: Secondary | ICD-10-CM | POA: Diagnosis not present

## 2017-11-03 DIAGNOSIS — M25562 Pain in left knee: Secondary | ICD-10-CM | POA: Diagnosis not present

## 2017-11-05 DIAGNOSIS — M25569 Pain in unspecified knee: Secondary | ICD-10-CM | POA: Diagnosis not present

## 2017-12-04 ENCOUNTER — Ambulatory Visit: Payer: Medicare Other | Admitting: Cardiology

## 2017-12-08 DIAGNOSIS — Z96659 Presence of unspecified artificial knee joint: Secondary | ICD-10-CM | POA: Insufficient documentation

## 2018-01-30 ENCOUNTER — Encounter (HOSPITAL_COMMUNITY): Payer: Self-pay

## 2018-01-30 ENCOUNTER — Other Ambulatory Visit: Payer: Self-pay

## 2018-01-30 ENCOUNTER — Emergency Department (HOSPITAL_COMMUNITY)
Admission: EM | Admit: 2018-01-30 | Discharge: 2018-01-30 | Disposition: A | Discharge status: Home / Self Care | Payer: Medicare Other | Attending: Emergency Medicine | Admitting: Emergency Medicine

## 2018-01-30 DIAGNOSIS — Z79899 Other long term (current) drug therapy: Secondary | ICD-10-CM | POA: Insufficient documentation

## 2018-01-30 DIAGNOSIS — L255 Unspecified contact dermatitis due to plants, except food: Secondary | ICD-10-CM | POA: Insufficient documentation

## 2018-01-30 DIAGNOSIS — Z96652 Presence of left artificial knee joint: Secondary | ICD-10-CM | POA: Diagnosis not present

## 2018-01-30 DIAGNOSIS — Z7982 Long term (current) use of aspirin: Secondary | ICD-10-CM | POA: Insufficient documentation

## 2018-01-30 DIAGNOSIS — Z87891 Personal history of nicotine dependence: Secondary | ICD-10-CM | POA: Diagnosis not present

## 2018-01-30 DIAGNOSIS — J449 Chronic obstructive pulmonary disease, unspecified: Secondary | ICD-10-CM | POA: Diagnosis not present

## 2018-01-30 DIAGNOSIS — I10 Essential (primary) hypertension: Secondary | ICD-10-CM | POA: Diagnosis not present

## 2018-01-30 DIAGNOSIS — R21 Rash and other nonspecific skin eruption: Secondary | ICD-10-CM | POA: Diagnosis present

## 2018-01-30 DIAGNOSIS — L237 Allergic contact dermatitis due to plants, except food: Secondary | ICD-10-CM | POA: Diagnosis not present

## 2018-01-30 MED ORDER — DEXAMETHASONE SODIUM PHOSPHATE 4 MG/ML IJ SOLN
10.0000 mg | Freq: Once | INTRAMUSCULAR | Status: AC
  Administered 2018-01-30: 10 mg via INTRAMUSCULAR
  Filled 2018-01-30: qty 3

## 2018-01-30 MED ORDER — PREDNISONE 20 MG PO TABS: 40.0000 mg | ORAL_TABLET | Freq: Every day | ORAL | 0 refills | Status: DC

## 2018-01-30 NOTE — ED Notes (Signed)
Pt reports that he is thinking of leaving   Encouraged to stay as he should be next to be seen

## 2018-01-30 NOTE — ED Triage Notes (Signed)
Rash on arms legs and abd from poison oak x a few days

## 2018-01-30 NOTE — ED Notes (Signed)
Trimming around yard and feels he has poison oak   Small rash to L arm R arm and L upper eye lid  Has used a cream that physicna has given him with relief

## 2018-01-30 NOTE — Discharge Instructions (Addendum)
Try taking over-the-counter Benadryl 1 tablet every 4-6 hours as needed for itching.  You may start the prednisone prescription tomorrow if needed.  Follow-up with your primary doctor or return here for any worsening symptoms such as facial swelling difficulty swallowing or breathing.

## 2018-02-01 NOTE — ED Provider Notes (Signed)
[facility] EMERGENCY DEPARTMENT Provider Note   CSN: [id number] Arrival date & time: 01/30/18  1014     History   Chief Complaint Chief Complaint  Patient presents with  . Rash    HPI [name] Short is a 72 y.o. male.  HPI   [name] Short is a 72 y.o. male who presents to the Emergency Department complaining of itching and rash to both arms, legs, and groin for 3 days.  He states that the rash began after working in the yard.  He states that he has been using a steroid cream that he was prescribed for another rash.  Rash has been improving.  He denies drainage, swelling, shortness of breath, swelling of the tongue lips or face and pain.    Past Medical History:  Diagnosis Date  . Arthritis    ESP IN HANDS  . Cancer ([place])    OCCULAR RIGHT EYE--SURGERY AND CHEMO  PT IS LEGALLY BLIND IN RT EYE  . Cataract    LEFT EYE  . COPD (chronic obstructive pulmonary disease) ([place])    FORMER SMOKER  . Erectile dysfunction   . GERD (gastroesophageal reflux disease)   . Glaucoma    ONLY IN RIGHT EYE  . Hypertension   . Lung cancer ([place])    lung ca dx 02/2010  . Paresthesia and pain of right extremity    RIGHT SHOULDER   . Pneumonia 2011  . Prostate cancer [facility])    prostate ca dx 11/11  . Shortness of breath    ONLY WITH EXERTION; HX OF LOBECTOMY FOR CANER    Patient Active Problem List   Diagnosis Date Noted  . Left knee DJD 09/10/2017  . Preoperative cardiovascular examination 09/07/2017  . Ex-smoker 09/07/2017  . Essential (primary) hypertension 12/23/2012  . HLD (hyperlipidemia) 12/23/2012  . Primary malignant neoplasm of prostate ([place]) 12/23/2012  . SCC of lung (small cell carcinoma) ([place]) 12/23/2012  . Organic erectile dysfunction 08/11/2012  . Cancer of left lung ([place]) 04/27/2012    Past Surgical History:  Procedure Laterality Date  . APPENDECTOMY  JUNE 2000  . CARDIAC CATHETERIZATION  2008   indication was due to LOC, chest tightness, diaphoreses; seen at  [place] [facility]; PER PATIENT , NORMAL ; no records in epic ; endoreses repeat intermittent sx since time of cath   . COLONOSCOPY  08/06/2012   Procedure: COLONOSCOPY;  Surgeon: Rogene Houston, MD;  [facility];  Service: Endoscopy;  Laterality: N/A;  955  . COLONOSCOPY N/A 08/08/2015   Procedure: COLONOSCOPY;  Surgeon: Rogene Houston, MD;  Location: [facility];  Service: Endoscopy;  Laterality: N/A;  1200  . ELBOW SURGERY    . EYE SURGERY     OCT 1999 DETACHED RETINA -RIGHT; MAY AND JUNE 2009 RIGHT EYE SURGERY FOR CANCER-LENS HAS BEEN REMOVED.  [name] LEFT UPPER LOBECTOMY  03/2010   FOR CANCER  . PENILE PROSTHESIS IMPLANT  08/10/2012   Procedure: PENILE PROTHESIS INFLATABLE;  Surgeon: Malka So, MD;  Location: [facility] ORS;  Service: Urology;  Laterality: N/A;  IMPLANT 3 PIECE PENILE PROSTHESIS   . PROSTATECTOMY  DEC 2011   FOR CANCER  . TOTAL KNEE ARTHROPLASTY Left 09/10/2017   Procedure: LEFT TOTAL KNEE ARTHROPLASTY;  Surgeon: Susa Day, MD;  Location: [facility] ORS;  Service: Orthopedics;  Laterality: Left;  120 mins      Home Medications    Prior to Admission medications   Medication Sig Start Date End Date  Taking? Authorizing Provider  acetaminophen (TYLENOL) 650 MG CR tablet Take 1,300 mg by mouth every morning. Reported on 02/06/2016    [provider]  aspirin EC 325 MG tablet Take 1 tablet (325 mg total) by mouth 2 (two) times daily. 09/10/17   Susa Day, MD  chlorthalidone (HYGROTON) 25 MG tablet Take 25 mg by mouth daily.     Asencion Noble, MD  docusate sodium (COLACE) 100 MG capsule Take 1 capsule (100 mg total) by mouth 2 (two) times daily as needed for mild constipation. 09/10/17   Susa Day, MD  losartan (COZAAR) 100 MG tablet Take 100 mg by mouth daily.    [provider]  NIFEdipine (PROCARDIA XL/ADALAT-CC) 90 MG 24 hr tablet Take 90 mg by mouth daily.  11/04/16   [provider]  omeprazole (PRILOSEC) 20 MG capsule Take 20 mg by  mouth daily.    [provider]  oxyCODONE-acetaminophen (PERCOCET) 5-325 MG tablet Take 1 tablet by mouth every 4 (four) hours as needed. 09/10/17   Susa Day, MD  polyethylene glycol (MIRALAX / GLYCOLAX) packet Take 17 g by mouth daily. 09/10/17   Susa Day, MD  predniSONE (DELTASONE) 20 MG tablet Take 2 tablets (40 mg total) by mouth daily. 01/30/18   Kem Parkinson, PA-C    Family History Family History  Problem Relation Age of Onset  . Colon polyps Brother   . Colon polyps Brother   . Colon polyps Brother   . Colon polyps Brother     Social History Social History   Tobacco Use  . Smoking status: Former Smoker    Packs/day: 1.00    Years: 40.00    Pack years: 40.00    Types: Cigarettes    Last attempt to quit: 09/07/2008    Years since quitting: 9.4  . Smokeless tobacco: Former Systems developer    Types: Chew    Quit date: 10/08/1985  Substance Use Topics  . Alcohol use: No  . Drug use: No     Allergies   Patient has no known allergies.   Review of Systems Review of Systems  Constitutional: Negative for activity change, appetite change, chills and fever.  HENT: Negative for facial swelling, sore throat and trouble swallowing.   Respiratory: Negative for chest tightness, shortness of breath and wheezing.   Musculoskeletal: Negative for neck pain and neck stiffness.  Skin: Positive for rash. Negative for wound.  Neurological: Negative for dizziness, weakness, numbness and headaches.  All other systems reviewed and are negative.    Physical Exam Updated Vital Signs BP (!) 140/96 (BP Location: Left Arm)   Pulse 74   Temp 97.7 F (36.5 C) (Oral)   Resp 16   Ht 5\' 7"  (1.702 m)   Wt 86.2 kg (190 lb)   SpO2 99%   BMI 29.76 kg/m   Physical Exam  Constitutional: He is oriented to person, place, and time. He appears well-developed and well-nourished. No distress.  HENT:  Head: Normocephalic and atraumatic.  Mouth/Throat: Uvula is midline, oropharynx  is clear and moist and mucous membranes are normal.  Neck: Normal range of motion. Neck supple.  Cardiovascular: Normal rate, regular rhythm, normal heart sounds and intact distal pulses.  No murmur heard. Pulmonary/Chest: Effort normal and breath sounds normal. No respiratory distress.  Musculoskeletal: Normal range of motion. He exhibits no edema or tenderness.  Lymphadenopathy:    He has no cervical adenopathy.  Neurological: He is alert and oriented to person, place, and time. No sensory  deficit. Coordination normal.  Skin: Skin is warm. Capillary refill takes less than 2 seconds. Rash noted. There is erythema.  Erythematous maculopapular rash of the bilateral upper arms and upper legs.  No edema, drainage, or vesicles.  Nursing note and vitals reviewed.    ED Treatments / Results  Labs (all labs ordered are listed, but only abnormal results are displayed) Labs Reviewed - No data to display  EKG None  Radiology No results found.  Procedures Procedures (including critical care time)  Medications Ordered in ED Medications  dexamethasone (DECADRON) injection 10 mg (10 mg Intramuscular Given 01/30/18 1242)     Initial Impression / Assessment and Plan / ED Course  I have reviewed the triage vital signs and the nursing notes.  Pertinent labs & imaging results that were available during my care of the patient were reviewed by me and considered in my medical decision making (see chart for details).     Patient is well-appearing.  Rash appears consistent with plant dermatitis.  No edema.  Airway is patent.  Patient agrees to plan of oral steroids and Benadryl if needed for itching.  Advised to discontinue his previously prescribed steroid cream.  Return precautions discussed.  Final Clinical Impressions(s) / ED Diagnoses   Final diagnoses:  Plant dermatitis    ED Discharge Orders        Ordered    predniSONE (DELTASONE) 20 MG tablet  Daily     01/30/18 1235         Kem Parkinson, PA-C 02/01/18 1954    Nat Christen, MD 02/04/18 [id number]

## 2018-03-11 DIAGNOSIS — T1512XA Foreign body in conjunctival sac, left eye, initial encounter: Secondary | ICD-10-CM | POA: Diagnosis not present

## 2018-03-11 DIAGNOSIS — H0102A Squamous blepharitis right eye, upper and lower eyelids: Secondary | ICD-10-CM | POA: Diagnosis not present

## 2018-03-11 DIAGNOSIS — H0102B Squamous blepharitis left eye, upper and lower eyelids: Secondary | ICD-10-CM | POA: Diagnosis not present

## 2018-04-26 DIAGNOSIS — G4733 Obstructive sleep apnea (adult) (pediatric): Secondary | ICD-10-CM | POA: Diagnosis not present

## 2018-04-26 DIAGNOSIS — I10 Essential (primary) hypertension: Secondary | ICD-10-CM | POA: Diagnosis not present

## 2018-04-26 DIAGNOSIS — R7301 Impaired fasting glucose: Secondary | ICD-10-CM | POA: Diagnosis not present

## 2018-04-26 DIAGNOSIS — E785 Hyperlipidemia, unspecified: Secondary | ICD-10-CM | POA: Diagnosis not present

## 2018-04-26 DIAGNOSIS — Z79899 Other long term (current) drug therapy: Secondary | ICD-10-CM | POA: Diagnosis not present

## 2018-04-26 DIAGNOSIS — I700 Atherosclerosis of aorta: Secondary | ICD-10-CM | POA: Diagnosis not present

## 2018-05-03 DIAGNOSIS — I10 Essential (primary) hypertension: Secondary | ICD-10-CM | POA: Diagnosis not present

## 2018-05-03 DIAGNOSIS — Z859 Personal history of malignant neoplasm, unspecified: Secondary | ICD-10-CM | POA: Diagnosis not present

## 2018-05-03 DIAGNOSIS — E785 Hyperlipidemia, unspecified: Secondary | ICD-10-CM | POA: Diagnosis not present

## 2018-05-03 DIAGNOSIS — Z6831 Body mass index (BMI) 31.0-31.9, adult: Secondary | ICD-10-CM | POA: Diagnosis not present

## 2018-05-03 DIAGNOSIS — I700 Atherosclerosis of aorta: Secondary | ICD-10-CM | POA: Diagnosis not present

## 2018-06-07 DIAGNOSIS — M1712 Unilateral primary osteoarthritis, left knee: Secondary | ICD-10-CM | POA: Diagnosis not present

## 2018-06-07 DIAGNOSIS — M1711 Unilateral primary osteoarthritis, right knee: Secondary | ICD-10-CM | POA: Diagnosis not present

## 2018-07-12 ENCOUNTER — Encounter (INDEPENDENT_AMBULATORY_CARE_PROVIDER_SITE_OTHER): Payer: Self-pay | Admitting: *Deleted

## 2018-07-16 DIAGNOSIS — Z23 Encounter for immunization: Secondary | ICD-10-CM | POA: Diagnosis not present

## 2018-08-05 ENCOUNTER — Other Ambulatory Visit (INDEPENDENT_AMBULATORY_CARE_PROVIDER_SITE_OTHER): Payer: Self-pay | Admitting: *Deleted

## 2018-08-05 DIAGNOSIS — Z86010 Personal history of colonic polyps: Secondary | ICD-10-CM

## 2018-08-09 DIAGNOSIS — H2512 Age-related nuclear cataract, left eye: Secondary | ICD-10-CM | POA: Diagnosis not present

## 2018-08-09 DIAGNOSIS — H338 Other retinal detachments: Secondary | ICD-10-CM | POA: Diagnosis not present

## 2018-08-09 DIAGNOSIS — H0102B Squamous blepharitis left eye, upper and lower eyelids: Secondary | ICD-10-CM | POA: Diagnosis not present

## 2018-08-09 DIAGNOSIS — H25812 Combined forms of age-related cataract, left eye: Secondary | ICD-10-CM | POA: Diagnosis not present

## 2018-08-09 DIAGNOSIS — T1512XA Foreign body in conjunctival sac, left eye, initial encounter: Secondary | ICD-10-CM | POA: Diagnosis not present

## 2018-08-16 DIAGNOSIS — D225 Melanocytic nevi of trunk: Secondary | ICD-10-CM | POA: Diagnosis not present

## 2018-08-16 DIAGNOSIS — L820 Inflamed seborrheic keratosis: Secondary | ICD-10-CM | POA: Diagnosis not present

## 2018-08-16 DIAGNOSIS — L308 Other specified dermatitis: Secondary | ICD-10-CM | POA: Diagnosis not present

## 2018-09-17 ENCOUNTER — Encounter (INDEPENDENT_AMBULATORY_CARE_PROVIDER_SITE_OTHER): Payer: Self-pay | Admitting: *Deleted

## 2018-09-17 ENCOUNTER — Telehealth (INDEPENDENT_AMBULATORY_CARE_PROVIDER_SITE_OTHER): Payer: Self-pay | Admitting: *Deleted

## 2018-09-17 NOTE — Telephone Encounter (Signed)
Patient needs trilyte 

## 2018-09-20 ENCOUNTER — Telehealth (INDEPENDENT_AMBULATORY_CARE_PROVIDER_SITE_OTHER): Payer: Self-pay | Admitting: *Deleted

## 2018-09-20 NOTE — Telephone Encounter (Signed)
Referring MD/PCP: fagan   Procedure: tcs  Reason/Indication:  Hx polyps  Has patient had this procedure before?  Yes, 2016  If so, when, by whom and where?    Is there a family history of colon cancer?  no  Who?  What age when diagnosed?    Is patient diabetic?   no      Does patient have prosthetic heart valve or mechanical valve?  no  Do you have a pacemaker?  no  Has patient ever had endocarditis? no  Has patient had joint replacement within last 12 months?  no  Is patient constipated or do they take laxatives? no  Does patient have a history of alcohol/drug use?  no  Is patient on blood thinner such as Coumadin, Plavix and/or Aspirin? yes  Medications: asa 81 mg daily, atorvastatin 20 mg daily, chlorthalidone 25 mg daily, losartan 100 mg daily, nifedipine 90 mg daily  Allergies: nkda  Medication Adjustment per Dr Lindi Adie, NP: asa 2 days  Procedure date & time: 10/21/18 at 1030

## 2018-09-21 MED ORDER — PEG 3350-KCL-NA BICARB-NACL 420 G PO SOLR: 4000.0000 mL | Freq: Once | ORAL | 0 refills | Status: AC

## 2018-09-21 NOTE — Telephone Encounter (Signed)
agree

## 2018-09-28 DIAGNOSIS — N481 Balanitis: Secondary | ICD-10-CM | POA: Diagnosis not present

## 2018-10-21 ENCOUNTER — Other Ambulatory Visit: Payer: Self-pay

## 2018-10-21 ENCOUNTER — Encounter (HOSPITAL_COMMUNITY)
Admission: RE | Disposition: A | Discharge status: Home / Self Care | Payer: Self-pay | Source: Home / Self Care | Attending: Internal Medicine

## 2018-10-21 ENCOUNTER — Encounter (HOSPITAL_COMMUNITY): Payer: Self-pay | Admitting: *Deleted

## 2018-10-21 ENCOUNTER — Ambulatory Visit (HOSPITAL_COMMUNITY)
Admission: RE | Admit: 2018-10-21 | Discharge: 2018-10-21 | Disposition: A | Discharge status: Home / Self Care | Payer: Medicare Other | Attending: Internal Medicine | Admitting: Internal Medicine

## 2018-10-21 DIAGNOSIS — Z85118 Personal history of other malignant neoplasm of bronchus and lung: Secondary | ICD-10-CM | POA: Insufficient documentation

## 2018-10-21 DIAGNOSIS — K5730 Diverticulosis of large intestine without perforation or abscess without bleeding: Secondary | ICD-10-CM | POA: Insufficient documentation

## 2018-10-21 DIAGNOSIS — K219 Gastro-esophageal reflux disease without esophagitis: Secondary | ICD-10-CM | POA: Diagnosis not present

## 2018-10-21 DIAGNOSIS — Z09 Encounter for follow-up examination after completed treatment for conditions other than malignant neoplasm: Secondary | ICD-10-CM | POA: Diagnosis not present

## 2018-10-21 DIAGNOSIS — Z79899 Other long term (current) drug therapy: Secondary | ICD-10-CM | POA: Insufficient documentation

## 2018-10-21 DIAGNOSIS — J449 Chronic obstructive pulmonary disease, unspecified: Secondary | ICD-10-CM | POA: Insufficient documentation

## 2018-10-21 DIAGNOSIS — D123 Benign neoplasm of transverse colon: Secondary | ICD-10-CM | POA: Insufficient documentation

## 2018-10-21 DIAGNOSIS — Z87891 Personal history of nicotine dependence: Secondary | ICD-10-CM | POA: Diagnosis not present

## 2018-10-21 DIAGNOSIS — K644 Residual hemorrhoidal skin tags: Secondary | ICD-10-CM | POA: Insufficient documentation

## 2018-10-21 DIAGNOSIS — I10 Essential (primary) hypertension: Secondary | ICD-10-CM | POA: Diagnosis not present

## 2018-10-21 DIAGNOSIS — Z86010 Personal history of colonic polyps: Secondary | ICD-10-CM | POA: Diagnosis not present

## 2018-10-21 DIAGNOSIS — Z7982 Long term (current) use of aspirin: Secondary | ICD-10-CM | POA: Diagnosis not present

## 2018-10-21 DIAGNOSIS — Z8546 Personal history of malignant neoplasm of prostate: Secondary | ICD-10-CM | POA: Insufficient documentation

## 2018-10-21 HISTORY — PX: POLYPECTOMY: SHX5525

## 2018-10-21 HISTORY — PX: COLONOSCOPY: SHX5424

## 2018-10-21 SURGERY — COLONOSCOPY
Anesthesia: Moderate Sedation

## 2018-10-21 MED ORDER — MIDAZOLAM HCL 5 MG/5ML IJ SOLN
INTRAMUSCULAR | Status: DC | PRN
  Administered 2018-10-21: 1 mg via INTRAVENOUS
  Administered 2018-10-21 (×2): 2 mg via INTRAVENOUS

## 2018-10-21 MED ORDER — MEPERIDINE HCL 50 MG/ML IJ SOLN
INTRAMUSCULAR | Status: AC
  Filled 2018-10-21: qty 1

## 2018-10-21 MED ORDER — SODIUM CHLORIDE 0.9 % IV SOLN
INTRAVENOUS | Status: DC
  Administered 2018-10-21: 10:00:00 via INTRAVENOUS

## 2018-10-21 MED ORDER — MIDAZOLAM HCL 5 MG/5ML IJ SOLN
INTRAMUSCULAR | Status: AC
  Filled 2018-10-21: qty 10

## 2018-10-21 MED ORDER — STERILE WATER FOR IRRIGATION IR SOLN
Status: DC | PRN
  Administered 2018-10-21: 10:00:00

## 2018-10-21 MED ORDER — MEPERIDINE HCL 50 MG/ML IJ SOLN
INTRAMUSCULAR | Status: DC | PRN
  Administered 2018-10-21 (×2): 25 mg via INTRAVENOUS

## 2018-10-21 NOTE — H&P (Signed)
[name] Short is an 73 y.o. male.   Chief Complaint: Patient is here for colonoscopy. HPI: Patient is 73 year old Caucasian male who has had multiple colonic adenomas removed on prior colonoscopies.  He is here for surveillance colonoscopy.  He denies abdominal pain change in bowel habits or rectal bleeding.  Personal history is significant for malignant tumor involving his right eye 11 years ago as well as a history of lung carcinoma and prostate carcinoma.  He remains in remission. Family history is negative for colorectal carcinoma.  Past Medical History:  Diagnosis Date  . Arthritis    ESP IN HANDS  . Cancer ([place])    OCCULAR RIGHT EYE--SURGERY AND CHEMO  PT IS LEGALLY BLIND IN RT EYE  . Cataract    LEFT EYE  . COPD (chronic obstructive pulmonary disease) ([place])    FORMER SMOKER  . Erectile dysfunction   . GERD (gastroesophageal reflux disease)   . Glaucoma    ONLY IN RIGHT EYE  . Hypertension   . Lung cancer ([place])    lung ca dx 02/2010  . Paresthesia and pain of right extremity    RIGHT SHOULDER   . Pneumonia 2011  . Prostate cancer [facility])    prostate ca dx 11/11  . Shortness of breath    ONLY WITH EXERTION; HX OF LOBECTOMY FOR CANER    Past Surgical History:  Procedure Laterality Date  . APPENDECTOMY  JUNE 2000  . CARDIAC CATHETERIZATION  2008   indication was due to LOC, chest tightness, diaphoreses; seen at [place] [facility]; PER PATIENT , NORMAL ; no records in epic ; endoreses repeat intermittent sx since time of cath   . COLONOSCOPY  08/06/2012   Procedure: COLONOSCOPY;  Surgeon: Rogene Houston, MD;  [facility];  Service: Endoscopy;  Laterality: N/A;  955  . COLONOSCOPY N/A 08/08/2015   Procedure: COLONOSCOPY;  Surgeon: Rogene Houston, MD;  [facility];  Service: Endoscopy;  Laterality: N/A;  1200  . ELBOW SURGERY    . EYE SURGERY     OCT 1999 DETACHED RETINA -RIGHT; MAY AND JUNE 2009 RIGHT EYE SURGERY FOR CANCER-LENS HAS BEEN REMOVED.  [name]  LEFT UPPER LOBECTOMY  03/2010   FOR CANCER  . PENILE PROSTHESIS IMPLANT  08/10/2012   Procedure: PENILE PROTHESIS INFLATABLE;  Surgeon: Malka So, MD;  Location: [facility];  Service: Urology;  Laterality: N/A;  IMPLANT 3 PIECE PENILE PROSTHESIS   . PROSTATECTOMY  DEC 2011   FOR CANCER  . TOTAL KNEE ARTHROPLASTY Left 09/10/2017   Procedure: LEFT TOTAL KNEE ARTHROPLASTY;  Surgeon: Susa Day, MD;  [facility];  Service: Orthopedics;  Laterality: Left;  120 mins    Family History  Problem Relation Age of Onset  . Colon polyps Brother   . Colon polyps Brother   . Colon polyps Brother   . Colon polyps Brother   . Colon cancer Neg Hx    Social History:  reports that he quit smoking about 10 years ago. His smoking use included cigarettes. He has a 40.00 pack-year smoking history. He quit smokeless tobacco use about 33 years ago.  His smokeless tobacco use included chew. He reports that he does not drink alcohol or use drugs.  Allergies:  Allergies  Allergen Reactions  . Silicone     Caused redness    Medications Prior to Admission  Medication Sig Dispense Refill  . acetaminophen (TYLENOL) 650 MG CR tablet Take 650 mg by mouth every  8 (eight) hours as needed for pain.     [name] atorvastatin (LIPITOR) 20 MG tablet Take 20 mg by mouth daily.    . chlorthalidone (HYGROTON) 25 MG tablet Take 25 mg by mouth daily.     . diphenhydrAMINE (BENADRYL) 25 MG tablet Take 25 mg by mouth daily.    [name] docusate sodium (COLACE) 100 MG capsule Take 1 capsule (100 mg total) by mouth 2 (two) times daily as needed for mild constipation. (Patient taking differently: Take 100 mg by mouth daily as needed for moderate constipation. ) 30 capsule 1  . losartan (COZAAR) 100 MG tablet Take 100 mg by mouth daily.    [name] NIFEdipine (PROCARDIA XL/ADALAT-CC) 90 MG 24 hr tablet Take 90 mg by mouth daily.     [name] omeprazole (PRILOSEC) 20 MG capsule Take 20 mg by mouth daily.    [name] aspirin EC 325 MG tablet Take 1 tablet  (325 mg total) by mouth 2 (two) times daily. (Patient not taking: Reported on 10/12/2018) 60 tablet 1  . aspirin EC 81 MG tablet Take 81 mg by mouth daily.    [name] oxyCODONE-acetaminophen (PERCOCET) 5-325 MG tablet Take 1 tablet by mouth every 4 (four) hours as needed. (Patient not taking: Reported on 10/12/2018) 40 tablet 0  . polyethylene glycol (MIRALAX / GLYCOLAX) packet Take 17 g by mouth daily. (Patient not taking: Reported on 10/12/2018) 14 each 0  . predniSONE (DELTASONE) 20 MG tablet Take 2 tablets (40 mg total) by mouth daily. (Patient not taking: Reported on 10/12/2018) 10 tablet 0    No results found for this or any previous visit (from the past 48 hour(s)). No results found.  ROS  Blood pressure (!) 130/102, pulse 87, temperature 97.8 F (36.6 C), temperature source Oral, resp. rate 18, height 5\' 7"  (1.702 m), weight 86.2 kg, SpO2 98 %. Physical Exam  Constitutional: He appears well-developed and well-nourished.  HENT:  Mouth/Throat: Oropharynx is clear and moist.  Eyes:  Hazy and distorted cornea of right eye.  Neck: No thyromegaly present.  Cardiovascular: Normal rate, regular rhythm and normal heart sounds.  No murmur heard. Respiratory: Effort normal and breath sounds normal.  GI:  Abdomen is full.  Laparoscopy scars noted across upper abdomen.  Abdomen is soft and nontender without organomegaly or masses.  Musculoskeletal:        General: No edema.  Lymphadenopathy:    He has no cervical adenopathy.  Neurological: He is alert.  Skin: Skin is warm and dry.     Assessment/Plan History of multiple colonic adenomas. Surveillance colonoscopy.  Hildred Laser, MD 10/21/2018, 10:21 AM

## 2018-10-21 NOTE — Op Note (Signed)
[facility] Patient Name: [name] Short Procedure Date: 10/21/2018 10:05 AM MRN: [id number] Date of Birth: [birth year] Attending MD: Hildred Laser , MD CSN: [id number] Age: 73 Admit Type: Outpatient Procedure:                Colonoscopy Indications:              High risk colon cancer surveillance: Personal                            history of colonic polyps Providers:                Hildred Laser, MD, Otis Peak B. Sharon Seller, RN, Gerome Sam, RN, Randa Spike, Technician Referring MD:             Asencion Noble, MD Medicines:                Meperidine 50 mg IV, Midazolam 5 mg IV Complications:            No immediate complications. Estimated Blood Loss:     Estimated blood loss was minimal. Procedure:                Pre-Anesthesia Assessment:                           - Prior to the procedure, a History and Physical                            was performed, and patient medications and                            allergies were reviewed. The patient's tolerance of                            previous anesthesia was also reviewed. The risks                            and benefits of the procedure and the sedation                            options and risks were discussed with the patient.                            All questions were answered, and informed consent                            was obtained. Prior Anticoagulants: The patient has                            taken no previous anticoagulant or antiplatelet                            agents. ASA Grade Assessment: III - A patient with  severe systemic disease. After reviewing the risks                            and benefits, the patient was deemed in                            satisfactory condition to undergo the procedure.                           After obtaining informed consent, the colonoscope                            was passed under direct vision. Throughout the                        procedure, the patient's blood pressure, pulse, and                            oxygen saturations were monitored continuously. The                            [id number]-[id number] ([id number]) scope was introduced through                            the anus and advanced to the the cecum, identified                            by appendiceal orifice and ileocecal valve. The                            colonoscopy was performed without difficulty. The                            patient tolerated the procedure well. The quality                            of the bowel preparation was excellent. The                            ileocecal valve, appendiceal orifice, and rectum                            were photographed. Scope In: 10:32:58 AM Scope Out: 11:07:58 AM Scope Withdrawal Time: 0 hours 29 minutes 40 seconds  Total Procedure Duration: 0 hours 35 minutes 0 seconds  Findings:      The perianal and digital rectal examinations were normal.      A small polyp was found in the hepatic flexure. The polyp was sessile.       Biopsies were taken with a cold forceps for histology. The pathology       specimen was placed into Bottle Number 1.      A 10 mm polyp was found in the mid transverse colon. The polyp was       sessile. Area was successfully injected with Eleview for lesion       assessment,  but the lesion could not be lifted adequately. The polyp was       removed with a hot snare. Resection was complete, and retrieval was       complete. One hemostatic clip was successfully placed (MR conditional).       The pathology specimen was placed into Bottle Number 1.      A few small-mouthed diverticula were found in the sigmoid colon.      External hemorrhoids were found during retroflexion. The hemorrhoids       were small.      Small lipoma across from ileocecal valve. Impression:               - One small polyp at the hepatic flexure. Biopsied.                           - One 10 mm  flat polyp in the mid transverse colon,                            removed with a hot snare. Resected and retrieved.                            Injected. Residual polyp coagulated with snare tip.                            Clip (MR conditional) was placed.                           - Diverticulosis in the sigmoid colon.                           - External hemorrhoids. Moderate Sedation:      Moderate (conscious) sedation was administered by the endoscopy nurse       and supervised by the endoscopist. The following parameters were       monitored: oxygen saturation, heart rate, blood pressure, CO2       capnography and response to care. Total physician intraservice time was       41 minutes. Recommendation:           - Patient has a contact number available for                            emergencies. The signs and symptoms of potential                            delayed complications were discussed with the                            patient. Return to normal activities tomorrow.                            Written discharge instructions were provided to the                            patient.                           -  High fiber diet today.                           - Continue present medications.                           - No aspirin, ibuprofen, naproxen, or other                            non-steroidal anti-inflammatory drugs for 7 days                            after polyp removal.                           - Await pathology results.                           - Repeat colonoscopy is recommended. The                            colonoscopy date will be determined after pathology                            results from today's exam become available for                            review. Procedure Code(s):        --- Professional ---                           [phone], Colonoscopy, flexible; with removal of                            tumor(s), polyp(s), or other lesion(s) by snare                             technique                           45380, 59, Colonoscopy, flexible; with biopsy,                            single or multiple                           45381, Colonoscopy, flexible; with directed                            submucosal injection(s), any substance                           99153, Moderate sedation; each additional 15                            minutes intraservice time  85631, Moderate sedation; each additional 15                            minutes intraservice time                           G0500, Moderate sedation services provided by the                            same physician or other qualified health care                            professional performing a gastrointestinal                            endoscopic service that sedation supports,                            requiring the presence of an independent trained                            observer to assist in the monitoring of the                            patient's level of consciousness and physiological                            status; initial 15 minutes of intra-service time;                            patient age 28 years or older (additional time may                            be reported with [phone], as appropriate) Diagnosis Code(s):        --- Professional ---                           Z86.010, Personal history of colonic polyps                           D12.3, Benign neoplasm of transverse colon (hepatic                            flexure or splenic flexure)                           K64.4, Residual hemorrhoidal skin tags                           K57.30, Diverticulosis of large intestine without                            perforation or abscess without bleeding CPT copyright 2018 [employer]. All rights reserved. The codes documented in this report are preliminary and upon coder review may  be revised to  meet current compliance  requirements. Hildred Laser, MD Hildred Laser, MD 10/21/2018 11:23:47 AM This report has been signed electronically. Number of Addenda: 0

## 2018-10-21 NOTE — Discharge Instructions (Signed)
No aspirin or NSAIDs for 1 week. Resume other medications as before. High-fiber diet. No driving for 24 hours. Physician will call with biopsy results.  Colonoscopy, Adult, Care After This sheet gives you information about how to care for yourself after your procedure. Your doctor may also give you more specific instructions. If you have problems or questions, call your doctor. What can I expect after the procedure? After the procedure, it is common to have:  A small amount of blood in your poop for 24 hours.  Some gas.  Mild cramping or bloating in your belly. Follow these instructions at home: General instructions  For the first 24 hours after the procedure: ? Do not drive or use machinery. ? Do not sign important documents. ? Do not drink alcohol. ? Do your daily activities more slowly than normal. ? Eat foods that are soft and easy to digest.  Take over-the-counter or prescription medicines only as told by your doctor. To help cramping and bloating:   Try walking around.  Put heat on your belly (abdomen) as told by your doctor. Use a heat source that your doctor recommends, such as a moist heat pack or a heating pad. ? Put a towel between your skin and the heat source. ? Leave the heat on for 20-30 minutes. ? Remove the heat if your skin turns bright red. This is especially important if you cannot feel pain, heat, or cold. You can get burned. Eating and drinking   Drink enough fluid to keep your pee (urine) clear or pale yellow.  Return to your normal diet as told by your doctor. Avoid heavy or fried foods that are hard to digest.  Avoid drinking alcohol for as long as told by your doctor. Contact a doctor if:  You have blood in your poop (stool) 2-3 days after the procedure. Get help right away if:  You have more than a small amount of blood in your poop.  You see large clumps of tissue (blood clots) in your poop.  Your belly is swollen.  You feel sick to  your stomach (nauseous).  You throw up (vomit).  You have a fever.  You have belly pain that gets worse, and medicine does not help your pain. Summary  After the procedure, it is common to have a small amount of blood in your poop. You may also have mild cramping and bloating in your belly.  For the first 24 hours after the procedure, do not drive or use machinery, do not sign important documents, and do not drink alcohol.  Get help right away if you have a lot of blood in your poop, feel sick to your stomach, have a fever, or have more belly pain. This information is not intended to replace advice given to you by your health care provider. Make sure you discuss any questions you have with your health care provider. Document Released: 10/04/2010 Document Revised: 07/02/2017 Document Reviewed: 05/26/2016 Elsevier Interactive Patient Education  2019 [place].   Colon Polyps  Polyps are tissue growths inside the body. Polyps can grow in many places, including the large intestine (colon). A polyp may be a round bump or a mushroom-shaped growth. You could have one polyp or several. Most colon polyps are noncancerous (benign). However, some colon polyps can become cancerous over time. Finding and removing the polyps early can help prevent this. What are the causes? The exact cause of colon polyps is not known. What increases the risk? You are more  likely to develop this condition if you: °· Have a family history of colon cancer or colon polyps. °· Are older than 50 or older than 45 if you are African American. °· Have inflammatory bowel disease, such as ulcerative colitis or Crohn's disease. °· Have certain hereditary conditions, such as: °? Familial adenomatous polyposis. °? Lynch syndrome. °? Turcot syndrome. °? Peutz-Jeghers syndrome. °· Are overweight. °· Smoke cigarettes. °· Do not get enough exercise. °· Drink too much alcohol. °· Eat a diet that is high in fat and red meat and low in  fiber. °· Had childhood cancer that was treated with abdominal radiation. °What are the signs or symptoms? °Most polyps do not cause symptoms. °If you have symptoms, they may include: °· Blood coming from your rectum when having a bowel movement. °· Blood in your stool. The stool may look dark red or black. °· Abdominal pain. °· A change in bowel habits, such as constipation or diarrhea. °How is this diagnosed? °This condition is diagnosed with a colonoscopy. This is a procedure in which a lighted, flexible scope is inserted into the anus and then passed into the colon to examine the area. Polyps are sometimes found when a colonoscopy is done as part of routine cancer screening tests. °How is this treated? °Treatment for this condition involves removing any polyps that are found. Most polyps can be removed during a colonoscopy. Those polyps will then be tested for cancer. Additional treatment may be needed depending on the results of testing. °Follow these instructions at home: °Lifestyle °· Maintain a healthy weight, or lose weight if recommended by your health care provider. °· Exercise every day or as told by your health care provider. °· Do not use any products that contain nicotine or tobacco, such as cigarettes and e-cigarettes. If you need help quitting, ask your health care provider. °· If you drink alcohol, limit how much you have: °? 0-1 drink a day for women. °? 0-2 drinks a day for men. °· Be aware of how much alcohol is in your drink. In the [place] one drink equals one 12 oz bottle of beer (355 mL), one 5 oz glass of wine (148 mL), or one 1½ oz shot of hard liquor (44 mL). °Eating and drinking ° °· Eat foods that are high in fiber, such as fruits, vegetables, and whole grains. °· Eat foods that are high in calcium and vitamin D, such as milk, cheese, yogurt, eggs, liver, fish, and broccoli. °· Limit foods that are high in fat, such as fried foods and desserts. °· Limit the amount of red meat and  processed meat you eat, such as hot dogs, sausage, bacon, and lunch meats. °General instructions °· Keep all follow-up visits as told by your health care provider. This is important. °? This includes having regularly scheduled colonoscopies. °? Talk to your health care provider about when you need a colonoscopy. °Contact a health care provider if: °· You have new or worsening bleeding during a bowel movement. °· You have new or increased blood in your stool. °· You have a change in bowel habits. °· You lose weight for no known reason. °Summary °· Polyps are tissue growths inside the body. Polyps can grow in many places, including the colon. °· Most colon polyps are noncancerous (benign), but some can become cancerous over time. °· This condition is diagnosed with a colonoscopy. °· Treatment for this condition involves removing any polyps that are found. Most polyps can be removed during a   colonoscopy. This information is not intended to replace advice given to you by your health care provider. Make sure you discuss any questions you have with your health care provider. Document Released: 05/28/2004 Document Revised: 12/17/2017 Document Reviewed: 12/17/2017 Elsevier Interactive Patient Education  2019 [employer].  Diverticulosis  Diverticulosis is a condition that develops when small pouches (diverticula) form in the wall of the large intestine (colon). The colon is where water is absorbed and stool is formed. The pouches form when the inside layer of the colon pushes through weak spots in the outer layers of the colon. You may have a few pouches or many of them. What are the causes? The cause of this condition is not known. What increases the risk? The following factors may make you more likely to develop this condition:  Being older than age 81. Your risk for this condition increases with age. Diverticulosis is rare among people younger than age 83. By age 35, many people have it.  Eating a  low-fiber diet.  Having frequent constipation.  Being overweight.  Not getting enough exercise.  Smoking.  Taking over-the-counter pain medicines, like aspirin and ibuprofen.  Having a family history of diverticulosis. What are the signs or symptoms? In most people, there are no symptoms of this condition. If you do have symptoms, they may include:  Bloating.  Cramps in the abdomen.  Constipation or diarrhea.  Pain in the lower left side of the abdomen. How is this diagnosed? This condition is most often diagnosed during an exam for other colon problems. Because diverticulosis usually has no symptoms, it often cannot be diagnosed independently. This condition may be diagnosed by:  Using a flexible scope to examine the colon (colonoscopy).  Taking an X-ray of the colon after dye has been put into the colon (barium enema).  Doing a CT scan. How is this treated? You may not need treatment for this condition if you have never developed an infection related to diverticulosis. If you have had an infection before, treatment may include:  Eating a high-fiber diet. This may include eating more fruits, vegetables, and grains.  Taking a fiber supplement.  Taking a live bacteria supplement (probiotic).  Taking medicine to relax your colon.  Taking antibiotic medicines. Follow these instructions at home:  Drink 6-8 glasses of water or more each day to prevent constipation.  Try not to strain when you have a bowel movement.  If you have had an infection before: ? Eat more fiber as directed by your health care provider or your diet and nutrition specialist (dietitian). ? Take a fiber supplement or probiotic, if your health care provider approves.  Take over-the-counter and prescription medicines only as told by your health care provider.  If you were prescribed an antibiotic, take it as told by your health care provider. Do not stop taking the antibiotic even if you start to  feel better.  Keep all follow-up visits as told by your health care provider. This is important. Contact a health care provider if:  You have pain in your abdomen.  You have bloating.  You have cramps.  You have not had a bowel movement in 3 days. Get help right away if:  Your pain gets worse.  Your bloating becomes very bad.  You have a fever or chills, and your symptoms suddenly get worse.  You vomit.  You have bowel movements that are bloody or black.  You have bleeding from your rectum. Summary  Diverticulosis is a condition  that develops when small pouches (diverticula) form in the wall of the large intestine (colon).  You may have a few pouches or many of them.  This condition is most often diagnosed during an exam for other colon problems.  If you have had an infection related to diverticulosis, treatment may include increasing the fiber in your diet, taking supplements, or taking medicines. This information is not intended to replace advice given to you by your health care provider. Make sure you discuss any questions you have with your health care provider. Document Released: 05/29/2004 Document Revised: 07/21/2016 Document Reviewed: 07/21/2016 Elsevier Interactive Patient Education  2019 [employer]  Hemorrhoids Hemorrhoids are swollen veins that may develop:  In the butt (rectum). These are called internal hemorrhoids.  Around the opening of the butt (anus). These are called external hemorrhoids. Hemorrhoids can cause pain, itching, or bleeding. Most of the time, they do not cause serious problems. They usually get better with diet changes, lifestyle changes, and other home treatments. What are the causes? This condition may be caused by:  Having trouble pooping (constipation).  Pushing hard (straining) to poop.  Watery poop (diarrhea).  Pregnancy.  Being very overweight (obese).  Sitting for long periods of time.  Heavy lifting or other  activity that causes you to strain.  Anal sex.  Riding a bike for a long period of time. What are the signs or symptoms? Symptoms of this condition include:  Pain.  Itching or soreness in the butt.  Bleeding from the butt.  Leaking poop.  Swelling in the area.  One or more lumps around the opening of your butt. How is this diagnosed? A doctor can often diagnose this condition by looking at the affected area. The doctor may also:  Do an exam that involves feeling the area with a gloved hand (digital rectal exam).  Examine the area inside your butt using a small tube (anoscope).  Order blood tests. This may be done if you have lost a lot of blood.  Have you get a test that involves looking inside the colon using a flexible tube with a camera on the end (sigmoidoscopy or colonoscopy). How is this treated? This condition can usually be treated at home. Your doctor may tell you to change what you eat, make lifestyle changes, or try home treatments. If these do not help, procedures can be done to remove the hemorrhoids or make them smaller. These may involve:  Placing rubber bands at the base of the hemorrhoids to cut off their blood supply.  Injecting medicine into the hemorrhoids to shrink them.  Shining a type of light energy onto the hemorrhoids to cause them to fall off.  Doing surgery to remove the hemorrhoids or cut off their blood supply. Follow these instructions at home: Eating and drinking   Eat foods that have a lot of fiber in them. These include whole grains, beans, nuts, fruits, and vegetables.  Ask your doctor about taking products that have added fiber (fibersupplements).  Reduce the amount of fat in your diet. You can do this by: ? Eating low-fat dairy products. ? Eating less red meat. ? Avoiding processed foods.  Drink enough fluid to keep your pee (urine) pale yellow. Managing pain and swelling   Take a warm-water bath (sitz bath) for 20 minutes  to ease pain. Do this 3-4 times a day. You may do this in a bathtub or using a portable sitz bath that fits over the toilet.  If told, put  ice on the painful area. It may be helpful to use ice between your warm baths. ? Put ice in a plastic bag. ? Place a towel between your skin and the bag. ? Leave the ice on for 20 minutes, 2-3 times a day. General instructions  Take over-the-counter and prescription medicines only as told by your doctor. ? Medicated creams and medicines may be used as told.  Exercise often. Ask your doctor how much and what kind of exercise is best for you.  Go to the bathroom when you have the urge to poop. Do not wait.  Avoid pushing too hard when you poop.  Keep your butt dry and clean. Use wet toilet paper or moist towelettes after pooping.  Do not sit on the toilet for a long time.  Keep all follow-up visits as told by your doctor. This is important. Contact a doctor if you:  Have pain and swelling that do not get better with treatment or medicine.  Have trouble pooping.  Cannot poop.  Have pain or swelling outside the area of the hemorrhoids. Get help right away if you have:  Bleeding that will not stop. Summary  Hemorrhoids are swollen veins in the butt or around the opening of the butt.  They can cause pain, itching, or bleeding.  Eat foods that have a lot of fiber in them. These include whole grains, beans, nuts, fruits, and vegetables.  Take a warm-water bath (sitz bath) for 20 minutes to ease pain. Do this 3-4 times a day. This information is not intended to replace advice given to you by your health care provider. Make sure you discuss any questions you have with your health care provider. Document Released: 06/10/2008 Document Revised: 01/21/2018 Document Reviewed: 01/21/2018 Elsevier Interactive Patient Education  2019 [employer].

## 2018-10-22 ENCOUNTER — Ambulatory Visit (INDEPENDENT_AMBULATORY_CARE_PROVIDER_SITE_OTHER): Payer: Medicare Other | Admitting: Urology

## 2018-10-22 DIAGNOSIS — N476 Balanoposthitis: Secondary | ICD-10-CM

## 2018-10-27 ENCOUNTER — Encounter (HOSPITAL_COMMUNITY): Payer: Self-pay | Admitting: Internal Medicine

## 2018-11-05 DIAGNOSIS — I10 Essential (primary) hypertension: Secondary | ICD-10-CM | POA: Diagnosis not present

## 2018-11-05 DIAGNOSIS — M1991 Primary osteoarthritis, unspecified site: Secondary | ICD-10-CM | POA: Diagnosis not present

## 2018-11-22 ENCOUNTER — Emergency Department (HOSPITAL_COMMUNITY): Payer: Medicare Other

## 2018-11-22 ENCOUNTER — Emergency Department (HOSPITAL_COMMUNITY)
Admission: EM | Admit: 2018-11-22 | Discharge: 2018-11-22 | Disposition: A | Discharge status: Home / Self Care | Payer: Medicare Other | Attending: Emergency Medicine | Admitting: Emergency Medicine

## 2018-11-22 ENCOUNTER — Encounter (HOSPITAL_COMMUNITY): Payer: Self-pay | Admitting: Emergency Medicine

## 2018-11-22 ENCOUNTER — Other Ambulatory Visit: Payer: Self-pay

## 2018-11-22 DIAGNOSIS — Z87891 Personal history of nicotine dependence: Secondary | ICD-10-CM | POA: Diagnosis not present

## 2018-11-22 DIAGNOSIS — Z79899 Other long term (current) drug therapy: Secondary | ICD-10-CM | POA: Diagnosis not present

## 2018-11-22 DIAGNOSIS — R05 Cough: Secondary | ICD-10-CM | POA: Diagnosis not present

## 2018-11-22 DIAGNOSIS — R7989 Other specified abnormal findings of blood chemistry: Secondary | ICD-10-CM | POA: Diagnosis not present

## 2018-11-22 DIAGNOSIS — Z7982 Long term (current) use of aspirin: Secondary | ICD-10-CM | POA: Insufficient documentation

## 2018-11-22 DIAGNOSIS — Z8546 Personal history of malignant neoplasm of prostate: Secondary | ICD-10-CM | POA: Insufficient documentation

## 2018-11-22 DIAGNOSIS — D0220 Carcinoma in situ of unspecified bronchus and lung: Secondary | ICD-10-CM | POA: Diagnosis not present

## 2018-11-22 DIAGNOSIS — Z96652 Presence of left artificial knee joint: Secondary | ICD-10-CM | POA: Diagnosis not present

## 2018-11-22 DIAGNOSIS — J40 Bronchitis, not specified as acute or chronic: Secondary | ICD-10-CM | POA: Diagnosis not present

## 2018-11-22 DIAGNOSIS — D0921 Carcinoma in situ of right eye: Secondary | ICD-10-CM | POA: Diagnosis not present

## 2018-11-22 DIAGNOSIS — R509 Fever, unspecified: Secondary | ICD-10-CM | POA: Diagnosis not present

## 2018-11-22 DIAGNOSIS — J42 Unspecified chronic bronchitis: Secondary | ICD-10-CM | POA: Diagnosis not present

## 2018-11-22 DIAGNOSIS — I10 Essential (primary) hypertension: Secondary | ICD-10-CM | POA: Insufficient documentation

## 2018-11-22 LAB — CBC WITH DIFFERENTIAL/PLATELET
Abs Immature Granulocytes: 0.05 10*3/uL (ref 0.00–0.07)
Basophils Absolute: 0.1 10*3/uL (ref 0.0–0.1)
Basophils Relative: 1 %
Eosinophils Absolute: 0.1 10*3/uL (ref 0.0–0.5)
Eosinophils Relative: 1 %
HCT: 39.2 % (ref 39.0–52.0)
Hemoglobin: 13.1 g/dL (ref 13.0–17.0)
Immature Granulocytes: 0 %
Lymphocytes Relative: 7 %
Lymphs Abs: 0.9 10*3/uL (ref 0.7–4.0)
MCH: 30.2 pg (ref 26.0–34.0)
MCHC: 33.4 g/dL (ref 30.0–36.0)
MCV: 90.3 fL (ref 80.0–100.0)
Monocytes Absolute: 0.9 10*3/uL (ref 0.1–1.0)
Monocytes Relative: 7 %
NEUTROS ABS: 10.9 10*3/uL — AB (ref 1.7–7.7)
Neutrophils Relative %: 84 %
Platelets: 264 10*3/uL (ref 150–400)
RBC: 4.34 MIL/uL (ref 4.22–5.81)
RDW: 13.8 % (ref 11.5–15.5)
WBC: 12.9 10*3/uL — ABNORMAL HIGH (ref 4.0–10.5)
nRBC: 0 % (ref 0.0–0.2)

## 2018-11-22 LAB — BASIC METABOLIC PANEL
Anion gap: 8 (ref 5–15)
BUN: 32 mg/dL — ABNORMAL HIGH (ref 8–23)
CO2: 23 mmol/L (ref 22–32)
Calcium: 9 mg/dL (ref 8.9–10.3)
Chloride: 105 mmol/L (ref 98–111)
Creatinine, Ser: 1.46 mg/dL — ABNORMAL HIGH (ref 0.61–1.24)
GFR calc Af Amer: 55 mL/min — ABNORMAL LOW (ref >60)
GFR calc non Af Amer: 48 mL/min — ABNORMAL LOW (ref >60)
Glucose, Bld: 106 mg/dL — ABNORMAL HIGH (ref 70–99)
POTASSIUM: 4.6 mmol/L (ref 3.5–5.1)
Sodium: 136 mmol/L (ref 135–145)

## 2018-11-22 LAB — URINALYSIS, ROUTINE W REFLEX MICROSCOPIC
Bilirubin Urine: NEGATIVE
Glucose, UA: NEGATIVE mg/dL
HGB URINE DIPSTICK: NEGATIVE
Ketones, ur: NEGATIVE mg/dL
Leukocytes,Ua: NEGATIVE
Nitrite: NEGATIVE
Protein, ur: NEGATIVE mg/dL
Specific Gravity, Urine: 1.01 (ref 1.005–1.030)
pH: 6 (ref 5.0–8.0)

## 2018-11-22 LAB — INFLUENZA PANEL BY PCR (TYPE A & B)
Influenza A By PCR: NEGATIVE
Influenza B By PCR: NEGATIVE

## 2018-11-22 MED ORDER — SODIUM CHLORIDE 0.9 % IV BOLUS
500.0000 mL | Freq: Once | INTRAVENOUS | Status: AC
  Administered 2018-11-22: 500 mL via INTRAVENOUS

## 2018-11-22 MED ORDER — DOXYCYCLINE HYCLATE 100 MG PO CAPS: 100.0000 mg | ORAL_CAPSULE | Freq: Two times a day (BID) | ORAL | 0 refills | Status: DC

## 2018-11-22 MED ORDER — ACETAMINOPHEN 325 MG PO TABS
650.0000 mg | ORAL_TABLET | Freq: Once | ORAL | Status: AC
  Administered 2018-11-22: 650 mg via ORAL
  Filled 2018-11-22: qty 2

## 2018-11-22 MED ORDER — BENZONATATE 200 MG PO CAPS: 200.0000 mg | ORAL_CAPSULE | Freq: Three times a day (TID) | ORAL | 0 refills | Status: DC | PRN

## 2018-11-22 NOTE — Discharge Instructions (Addendum)
Take the antibiotic as directed until its finished.  Your kidney function tests tonight are slightly elevated, this will need to be rechecked by your primary doctor.  Take tylenol extra strength, one every 4 hrs for fever.  Call Dr. Ria Comment office to arrange a follow-up appt for this week.  Return to ER for any worsening symptoms

## 2018-11-22 NOTE — ED Provider Notes (Signed)
[facility] EMERGENCY DEPARTMENT Provider Note   CSN: [id number] Arrival date & time: 11/22/18  1929    History   Chief Complaint Chief Complaint  Patient presents with  . Fever    HPI [name] Short is a 73 y.o. male.     HPI   [name] Short is a 73 y.o. male with hx of prostate and lung CA with partial lobectomy, who presents to the Emergency Department complaining of chills, generalized body aches, nasal congestion and cough.  States he developed "shaking chills" this afternoon and other symptoms have been present for several days.  He describes his cough has mostly non-productive.  Checked his temperature at home and it was 102 orally.  Denies taking any antipyretics today.  Wife with recent flu diagnosis.  He denies chest pain, shortness of breath, dysuria, vomiting or diarrhea, and sore throat.  No recent travel   Past Medical History:  Diagnosis Date  . Arthritis    ESP IN HANDS  . Cancer ([place])    OCCULAR RIGHT EYE--SURGERY AND CHEMO  PT IS LEGALLY BLIND IN RT EYE  . Cataract    LEFT EYE  . COPD (chronic obstructive pulmonary disease) ([place])    FORMER SMOKER  . Erectile dysfunction   . GERD (gastroesophageal reflux disease)   . Glaucoma    ONLY IN RIGHT EYE  . Hypertension   . Lung cancer ([place])    lung ca dx 02/2010  . Paresthesia and pain of right extremity    RIGHT SHOULDER   . Pneumonia 2011  . Prostate cancer [facility])    prostate ca dx 11/11  . Shortness of breath    ONLY WITH EXERTION; HX OF LOBECTOMY FOR CANER    Patient Active Problem List   Diagnosis Date Noted  . Hx of colonic polyps 08/05/2018  . Left knee DJD 09/10/2017  . Preoperative cardiovascular examination 09/07/2017  . Ex-smoker 09/07/2017  . Essential (primary) hypertension 12/23/2012  . HLD (hyperlipidemia) 12/23/2012  . Primary malignant neoplasm of prostate ([place]) 12/23/2012  . SCC of lung (small cell carcinoma) ([place]) 12/23/2012  . Organic erectile dysfunction 08/11/2012  . Cancer of  left lung ([place]) 04/27/2012    Past Surgical History:  Procedure Laterality Date  . APPENDECTOMY  JUNE 2000  . CARDIAC CATHETERIZATION  2008   indication was due to LOC, chest tightness, diaphoreses; seen at [facility]; PER PATIENT , NORMAL ; no records in epic ; endoreses repeat intermittent sx since time of cath   . COLONOSCOPY  08/06/2012   Procedure: COLONOSCOPY;  Surgeon: Rogene Houston, MD;  Location[facility];  Service: Endoscopy;  Laterality: N/A;  955  . COLONOSCOPY N/A 08/08/2015   Procedure: COLONOSCOPY;  Surgeon: Rogene Houston, MD;  Location: [facility];  Service: Endoscopy;  Laterality: N/A;  1200  . COLONOSCOPY N/A 10/21/2018   Procedure: COLONOSCOPY;  Surgeon: Rogene Houston, MD;  Location: [facility];  Service: Endoscopy;  Laterality: N/A;  1030  . ELBOW SURGERY    . EYE SURGERY     OCT 1999 DETACHED RETINA -RIGHT; MAY AND JUNE 2009 RIGHT EYE SURGERY FOR CANCER-LENS HAS BEEN REMOVED.  [name] LEFT UPPER LOBECTOMY  03/2010   FOR CANCER  . PENILE PROSTHESIS IMPLANT  08/10/2012   Procedure: PENILE PROTHESIS INFLATABLE;  Surgeon: Malka So, MD;  Location: [facility] ORS;  Service: Urology;  Laterality: N/A;  IMPLANT 3 PIECE PENILE PROSTHESIS   . POLYPECTOMY  10/21/2018  Procedure: POLYPECTOMY;  Surgeon: Rogene Houston, MD;  [facility];  Service: Endoscopy;;  colon  . PROSTATECTOMY  DEC 2011   FOR CANCER  . TOTAL KNEE ARTHROPLASTY Left 09/10/2017   Procedure: LEFT TOTAL KNEE ARTHROPLASTY;  Surgeon: Susa Day, MD;  Location: [facility];  Service: Orthopedics;  Laterality: Left;  120 mins        Home Medications    Prior to Admission medications   Medication Sig Start Date End Date Taking? Authorizing Provider  acetaminophen (TYLENOL) 650 MG CR tablet Take 650 mg by mouth every 8 (eight) hours as needed for pain.     [provider]  aspirin EC 81 MG tablet Take 1 tablet (81 mg total) by mouth daily. 10/28/18   Rehman, Mechele Dawley, MD    atorvastatin (LIPITOR) 20 MG tablet Take 20 mg by mouth daily.    [provider]  chlorthalidone (HYGROTON) 25 MG tablet Take 25 mg by mouth daily.     Asencion Noble, MD  diphenhydrAMINE (BENADRYL) 25 MG tablet Take 25 mg by mouth daily.    [provider]  docusate sodium (COLACE) 100 MG capsule Take 1 capsule (100 mg total) by mouth 2 (two) times daily as needed for mild constipation. Patient taking differently: Take 100 mg by mouth daily as needed for moderate constipation.  09/10/17   Susa Day, MD  losartan (COZAAR) 100 MG tablet Take 100 mg by mouth daily.    [provider]  NIFEdipine (PROCARDIA XL/ADALAT-CC) 90 MG 24 hr tablet Take 90 mg by mouth daily.  11/04/16   [provider]  omeprazole (PRILOSEC) 20 MG capsule Take 20 mg by mouth daily.    [provider]    Family History Family History  Problem Relation Age of Onset  . Colon polyps Brother   . Colon polyps Brother   . Colon polyps Brother   . Colon polyps Brother   . Colon cancer Neg Hx     Social History Social History   Tobacco Use  . Smoking status: Former Smoker    Packs/day: 1.00    Years: 40.00    Pack years: 40.00    Types: Cigarettes    Last attempt to quit: 09/07/2008    Years since quitting: 10.2  . Smokeless tobacco: Former Systems developer    Types: Chew    Quit date: 10/08/1985  Substance Use Topics  . Alcohol use: No  . Drug use: No     Allergies   Silicone   Review of Systems Review of Systems  Constitutional: Positive for chills and fever. Negative for activity change and appetite change.  HENT: Positive for congestion, rhinorrhea, sinus pressure, sinus pain and sneezing. Negative for sore throat and trouble swallowing.   Respiratory: Positive for cough. Negative for chest tightness, shortness of breath and wheezing.   Cardiovascular: Negative for chest pain.  Gastrointestinal: Negative for abdominal pain, diarrhea, nausea and vomiting.   Genitourinary: Negative for dysuria.  Musculoskeletal: Positive for myalgias.  Skin: Negative for rash.  Neurological: Negative for dizziness, syncope, weakness and headaches.     Physical Exam Updated Vital Signs BP 122/68   Pulse (!) 102   Temp 98.8 F (37.1 C) (Oral)   Resp 18   SpO2 95%   Physical Exam Vitals signs and nursing note reviewed.  Constitutional:      General: He is not in acute distress.    Appearance: He is not ill-appearing.  HENT:  Right Ear: Tympanic membrane and ear canal normal.     Left Ear: Tympanic membrane and ear canal normal.     Mouth/Throat:     Mouth: Mucous membranes are moist.     Pharynx: Oropharynx is clear. No posterior oropharyngeal erythema.  Eyes:     Comments: Pt blind in right eye at baseline  Neck:     Musculoskeletal: Normal range of motion. No muscular tenderness.     Meningeal: Kernig's sign absent.  Cardiovascular:     Rate and Rhythm: Normal rate and regular rhythm.     Pulses: Normal pulses.  Pulmonary:     Effort: Pulmonary effort is normal.     Breath sounds: Normal breath sounds. No wheezing or rales.  Chest:     Chest wall: No tenderness.  Abdominal:     General: There is no distension.     Palpations: Abdomen is soft.     Tenderness: There is no abdominal tenderness.  Musculoskeletal: Normal range of motion.     Right lower leg: No edema.     Left lower leg: No edema.  Lymphadenopathy:     Cervical: No cervical adenopathy.  Skin:    General: Skin is warm.     Capillary Refill: Capillary refill takes less than 2 seconds.     Findings: No rash.  Neurological:     General: No focal deficit present.     Mental Status: He is alert.     GCS: GCS eye subscore is 4. GCS verbal subscore is 5. GCS motor subscore is 6.     Sensory: Sensation is intact.     Motor: Motor function is intact. No weakness.      ED Treatments / Results  Labs (all labs ordered are listed, but only abnormal results are  displayed) Labs Reviewed  BASIC METABOLIC PANEL - Abnormal; Notable for the following components:      Result Value   Glucose, Bld 106 (*)    BUN 32 (*)    Creatinine, Ser 1.46 (*)    GFR calc non Af Amer 48 (*)    GFR calc Af Amer 55 (*)    All other components within normal limits  CBC WITH DIFFERENTIAL/PLATELET - Abnormal; Notable for the following components:   WBC 12.9 (*)    Neutro Abs 10.9 (*)    All other components within normal limits  URINALYSIS, ROUTINE W REFLEX MICROSCOPIC  INFLUENZA PANEL BY PCR (TYPE A & B)    EKG None  Radiology Dg Chest 2 View  Result Date: 11/22/2018 CLINICAL DATA:  Cough and fever. Chills earlier today. History of prostate cancer 2011. Left upper lobectomy for lung cancer 2011. EXAM: CHEST - 2 VIEW COMPARISON:  CT chest 01/30/2016 FINDINGS: Volume loss in the left lung consistent with postoperative change. Heart size and pulmonary vascularity are normal. Central interstitial pattern with peribronchial thickening suggesting chronic bronchitis. No focal consolidation or airspace disease. No blunting of costophrenic angles. No pneumothorax. Mediastinal contours appear intact. Calcified and tortuous aorta. IMPRESSION: Chronic bronchitic changes in the lungs. No evidence of active pulmonary disease. Electronically Signed   By: Lucienne Capers M.D.   On: 11/22/2018 21:45    Procedures Procedures (including critical care time)  Medications Ordered in ED Medications  acetaminophen (TYLENOL) tablet 650 mg (650 mg Oral Given 11/22/18 2149)  sodium chloride 0.9 % bolus 500 mL (0 mLs Intravenous Stopped 11/22/18 2301)     Initial Impression / Assessment and Plan / ED Course  I have reviewed the triage vital signs and the nursing notes.  Pertinent labs & imaging results that were available during my care of the patient were reviewed by me and considered in my medical decision making (see chart for details).         Pt with flu like sx's for few days  and developed chills and fever today.  Flu testing is negative.    Discussed pt findings with Dr. Roderic Palau.    On recheck after IVF's and tylenol, pt reports feeling better.  Tachycardia has improved.  No hypoxia, chest pain or dyspnea.  CXR shows chronic bronchitis and given his medical hx of lung ca and partial lobectomy, I will treat with abx. He also has a slightly elevated serum creatinine which appears stable.  I feel he is appropriate for d/c home and he agrees to close out pt f/u and strict return precautions also discussed.    Final Clinical Impressions(s) / ED Diagnoses   Final diagnoses:  Chronic bronchitis, unspecified chronic bronchitis type ([place])  Elevated serum creatinine    ED Discharge Orders    None       Kem Parkinson, PA-C 11/23/18 0039    Milton Ferguson, MD 11/23/18 2337

## 2018-11-22 NOTE — ED Triage Notes (Signed)
Pt states he started getting chills earlier today and wife was checking his temp at home and it "got up to 102". Asked pt whether he had taken anything for it at home and he said "wasn't no use if I was coming up here". Pt also c/o sinus congestion.

## 2018-11-26 ENCOUNTER — Ambulatory Visit (INDEPENDENT_AMBULATORY_CARE_PROVIDER_SITE_OTHER): Payer: Medicare Other | Admitting: Urology

## 2018-11-26 DIAGNOSIS — L291 Pruritus scroti: Secondary | ICD-10-CM | POA: Diagnosis not present

## 2018-11-26 DIAGNOSIS — N476 Balanoposthitis: Secondary | ICD-10-CM

## 2018-11-29 DIAGNOSIS — J209 Acute bronchitis, unspecified: Secondary | ICD-10-CM | POA: Diagnosis not present

## 2018-11-29 DIAGNOSIS — Z6830 Body mass index (BMI) 30.0-30.9, adult: Secondary | ICD-10-CM | POA: Diagnosis not present

## 2018-12-22 DIAGNOSIS — L0100 Impetigo, unspecified: Secondary | ICD-10-CM | POA: Diagnosis not present

## 2018-12-22 DIAGNOSIS — B353 Tinea pedis: Secondary | ICD-10-CM | POA: Diagnosis not present

## 2018-12-22 DIAGNOSIS — L280 Lichen simplex chronicus: Secondary | ICD-10-CM | POA: Diagnosis not present

## 2019-01-12 DIAGNOSIS — L0100 Impetigo, unspecified: Secondary | ICD-10-CM | POA: Diagnosis not present

## 2019-01-12 DIAGNOSIS — L280 Lichen simplex chronicus: Secondary | ICD-10-CM | POA: Diagnosis not present

## 2019-02-08 DIAGNOSIS — H338 Other retinal detachments: Secondary | ICD-10-CM | POA: Diagnosis not present

## 2019-02-08 DIAGNOSIS — H25812 Combined forms of age-related cataract, left eye: Secondary | ICD-10-CM | POA: Diagnosis not present

## 2019-03-04 ENCOUNTER — Ambulatory Visit (INDEPENDENT_AMBULATORY_CARE_PROVIDER_SITE_OTHER): Payer: Medicare Other | Admitting: Urology

## 2019-03-04 DIAGNOSIS — Z8546 Personal history of malignant neoplasm of prostate: Secondary | ICD-10-CM

## 2019-03-04 DIAGNOSIS — N393 Stress incontinence (female) (male): Secondary | ICD-10-CM

## 2019-03-04 DIAGNOSIS — N5231 Erectile dysfunction following radical prostatectomy: Secondary | ICD-10-CM | POA: Diagnosis not present

## 2019-05-24 DIAGNOSIS — E785 Hyperlipidemia, unspecified: Secondary | ICD-10-CM | POA: Diagnosis not present

## 2019-05-24 DIAGNOSIS — I10 Essential (primary) hypertension: Secondary | ICD-10-CM | POA: Diagnosis not present

## 2019-05-24 DIAGNOSIS — G4730 Sleep apnea, unspecified: Secondary | ICD-10-CM | POA: Diagnosis not present

## 2019-05-24 DIAGNOSIS — Z79899 Other long term (current) drug therapy: Secondary | ICD-10-CM | POA: Diagnosis not present

## 2019-05-24 DIAGNOSIS — R7303 Prediabetes: Secondary | ICD-10-CM | POA: Diagnosis not present

## 2019-05-24 DIAGNOSIS — I700 Atherosclerosis of aorta: Secondary | ICD-10-CM | POA: Diagnosis not present

## 2019-05-31 ENCOUNTER — Other Ambulatory Visit: Payer: Self-pay

## 2019-05-31 ENCOUNTER — Ambulatory Visit (HOSPITAL_COMMUNITY)
Admission: RE | Admit: 2019-05-31 | Discharge: 2019-05-31 | Disposition: A | Discharge status: Home / Self Care | Payer: Medicare Other | Source: Ambulatory Visit | Attending: Internal Medicine | Admitting: Internal Medicine

## 2019-05-31 ENCOUNTER — Other Ambulatory Visit (HOSPITAL_COMMUNITY): Payer: Self-pay | Admitting: Internal Medicine

## 2019-05-31 DIAGNOSIS — R079 Chest pain, unspecified: Secondary | ICD-10-CM | POA: Insufficient documentation

## 2019-05-31 DIAGNOSIS — Z8546 Personal history of malignant neoplasm of prostate: Secondary | ICD-10-CM | POA: Diagnosis not present

## 2019-05-31 DIAGNOSIS — Z85118 Personal history of other malignant neoplasm of bronchus and lung: Secondary | ICD-10-CM | POA: Diagnosis not present

## 2019-05-31 DIAGNOSIS — I10 Essential (primary) hypertension: Secondary | ICD-10-CM | POA: Diagnosis not present

## 2019-05-31 DIAGNOSIS — E785 Hyperlipidemia, unspecified: Secondary | ICD-10-CM | POA: Diagnosis not present

## 2019-07-11 DIAGNOSIS — Z23 Encounter for immunization: Secondary | ICD-10-CM | POA: Diagnosis not present

## 2019-08-08 DIAGNOSIS — H25812 Combined forms of age-related cataract, left eye: Secondary | ICD-10-CM | POA: Diagnosis not present

## 2019-09-27 DIAGNOSIS — L0100 Impetigo, unspecified: Secondary | ICD-10-CM | POA: Diagnosis not present

## 2019-10-22 ENCOUNTER — Ambulatory Visit: Payer: Medicare Other | Attending: Internal Medicine

## 2019-10-22 DIAGNOSIS — Z23 Encounter for immunization: Secondary | ICD-10-CM | POA: Insufficient documentation

## 2019-10-22 NOTE — Progress Notes (Signed)
.    Covid-19 Vaccination Clinic  Name:  [name] Short    MRN: [id number] DOB: 1946-06-23  10/22/2019  Mr. [name] Short was observed post Covid-19 immunization for 15 minutes without incidence. He was provided with Vaccine Information Sheet and instruction to access the V-Safe system.   Mr. [name] Short was instructed to call 911 with any severe reactions post vaccine: [name] Difficulty breathing  . Swelling of your face and throat  . A fast heartbeat  . A bad rash all over your body  . Dizziness and weakness    Immunizations Administered    Name Date Dose VIS Date Route   Pfizer COVID-19 Vaccine 10/22/2019  5:25 PM 0.3 mL 08/26/2019 Intramuscular   Manufacturer: [place]   Lot: [id number]   [place]: [id number]

## 2019-11-16 ENCOUNTER — Ambulatory Visit: Payer: Medicare Other | Attending: Internal Medicine

## 2019-11-16 DIAGNOSIS — Z23 Encounter for immunization: Secondary | ICD-10-CM

## 2019-11-16 NOTE — Progress Notes (Signed)
   Covid-19 Vaccination Clinic  Name:  [name] Short    MRN: [id number] DOB: [birth year]  11/16/2019  Mr. [name] Short was observed post Covid-19 immunization for 30 minutes based on pre-vaccination screening without incident. He was provided with Vaccine Information Sheet and instruction to access the V-Safe system.   Mr. [name] Short was instructed to call 911 with any severe reactions post vaccine: [name] Difficulty breathing  . Swelling of face and throat  . A fast heartbeat  . A bad rash all over body  . Dizziness and weakness   Immunizations Administered    Name Date Dose VIS Date Route   Pfizer COVID-19 Vaccine 11/16/2019  2:22 PM 0.3 mL 08/26/2019 Intramuscular   Manufacturer: [place]   Lot: [id number]   [place]: [id number]

## 2019-11-29 DIAGNOSIS — I10 Essential (primary) hypertension: Secondary | ICD-10-CM | POA: Diagnosis not present

## 2019-11-29 DIAGNOSIS — Z85118 Personal history of other malignant neoplasm of bronchus and lung: Secondary | ICD-10-CM | POA: Diagnosis not present

## 2020-02-06 DIAGNOSIS — H338 Other retinal detachments: Secondary | ICD-10-CM | POA: Diagnosis not present

## 2020-02-06 DIAGNOSIS — H25812 Combined forms of age-related cataract, left eye: Secondary | ICD-10-CM | POA: Diagnosis not present

## 2020-05-24 DIAGNOSIS — D075 Carcinoma in situ of prostate: Secondary | ICD-10-CM | POA: Diagnosis not present

## 2020-05-24 DIAGNOSIS — I700 Atherosclerosis of aorta: Secondary | ICD-10-CM | POA: Diagnosis not present

## 2020-05-24 DIAGNOSIS — I10 Essential (primary) hypertension: Secondary | ICD-10-CM | POA: Diagnosis not present

## 2020-05-24 DIAGNOSIS — Z79899 Other long term (current) drug therapy: Secondary | ICD-10-CM | POA: Diagnosis not present

## 2020-05-24 DIAGNOSIS — C3491 Malignant neoplasm of unspecified part of right bronchus or lung: Secondary | ICD-10-CM | POA: Diagnosis not present

## 2020-05-24 DIAGNOSIS — E785 Hyperlipidemia, unspecified: Secondary | ICD-10-CM | POA: Diagnosis not present

## 2020-05-31 DIAGNOSIS — I700 Atherosclerosis of aorta: Secondary | ICD-10-CM | POA: Diagnosis not present

## 2020-05-31 DIAGNOSIS — E785 Hyperlipidemia, unspecified: Secondary | ICD-10-CM | POA: Diagnosis not present

## 2020-05-31 DIAGNOSIS — I10 Essential (primary) hypertension: Secondary | ICD-10-CM | POA: Diagnosis not present

## 2020-05-31 DIAGNOSIS — Z0001 Encounter for general adult medical examination with abnormal findings: Secondary | ICD-10-CM | POA: Diagnosis not present

## 2020-07-10 DIAGNOSIS — Z23 Encounter for immunization: Secondary | ICD-10-CM | POA: Diagnosis not present

## 2020-08-02 ENCOUNTER — Ambulatory Visit: Payer: Medicare Other | Attending: Internal Medicine

## 2020-08-02 DIAGNOSIS — Z23 Encounter for immunization: Secondary | ICD-10-CM

## 2020-08-02 NOTE — Progress Notes (Signed)
   Covid-19 Vaccination Clinic  Name:  [name] Short    MRN: [id number] DOB: 08-08-1946  08/02/2020  [name] was observed post Covid-19 immunization for 15 minutes without incident. He was provided with Vaccine Information Sheet and instruction to access the V-Safe system.   [name] was instructed to call 911 with any severe reactions post vaccine: [name] Difficulty breathing  . Swelling of face and throat  . A fast heartbeat  . A bad rash all over body  . Dizziness and weakness   Immunizations Administered    Name Date Dose VIS Date [place] COVID-19 Vaccine 08/02/2020  2:24 PM 0.3 mL 07/04/2020 Intramuscular   Manufacturer: [place]   Lot: [id number]   [place]: [id number]

## 2020-08-06 DIAGNOSIS — H25812 Combined forms of age-related cataract, left eye: Secondary | ICD-10-CM | POA: Diagnosis not present

## 2020-08-06 DIAGNOSIS — H31092 Other chorioretinal scars, left eye: Secondary | ICD-10-CM | POA: Diagnosis not present

## 2020-08-06 DIAGNOSIS — H338 Other retinal detachments: Secondary | ICD-10-CM | POA: Diagnosis not present

## 2021-01-24 ENCOUNTER — Ambulatory Visit: Payer: Medicare Other

## 2021-01-24 ENCOUNTER — Other Ambulatory Visit (HOSPITAL_BASED_OUTPATIENT_CLINIC_OR_DEPARTMENT_OTHER): Payer: Self-pay

## 2021-10-14 ENCOUNTER — Encounter (INDEPENDENT_AMBULATORY_CARE_PROVIDER_SITE_OTHER): Payer: Self-pay | Admitting: *Deleted

## 2021-11-07 ENCOUNTER — Encounter (INDEPENDENT_AMBULATORY_CARE_PROVIDER_SITE_OTHER): Payer: Self-pay | Admitting: *Deleted

## 2021-11-07 ENCOUNTER — Telehealth (INDEPENDENT_AMBULATORY_CARE_PROVIDER_SITE_OTHER): Payer: Self-pay | Admitting: *Deleted

## 2021-11-07 MED ORDER — PEG 3350-KCL-NA BICARB-NACL 420 G PO SOLR: 4000.0000 mL | Freq: Once | ORAL | 0 refills | Status: AC

## 2021-11-07 NOTE — Telephone Encounter (Signed)
Patient needs trilyte 

## 2021-11-08 ENCOUNTER — Other Ambulatory Visit (INDEPENDENT_AMBULATORY_CARE_PROVIDER_SITE_OTHER): Payer: Self-pay

## 2021-11-08 DIAGNOSIS — Z86010 Personal history of colonic polyps: Secondary | ICD-10-CM

## 2021-11-21 ENCOUNTER — Telehealth (INDEPENDENT_AMBULATORY_CARE_PROVIDER_SITE_OTHER): Payer: Self-pay | Admitting: *Deleted

## 2021-11-21 NOTE — Telephone Encounter (Signed)
Referring MD/PCP: fagan ? ?Procedure: tcs ? ?Reason/Indication:  hx polyps ? ?Has patient had this procedure before?  Yes, 10/2018 ? If so, when, by whom and where?   ? ?Is there a family history of colon cancer?  no ? Who?  What age when diagnosed?   ? ?Is patient diabetic? If yes, Type 1 or Type 2   no ?     ?Does patient have prosthetic heart valve or mechanical valve?  no ? ?Do you have a pacemaker/defibrillator?  no ? ?Has patient ever had endocarditis/atrial fibrillation? no ? ?Does patient use oxygen? no ? ?Has patient had joint replacement within last 12 months?  no ? ?Is patient constipated or do they take laxatives? no ? ?Does patient have a history of alcohol/drug use?  no ? ?Have you had a stroke/heart attack last 6 mths? no ? ?Do you take medicine for weight loss?  no ? ?For male patients,: have you had a hysterectomy  ?                     are you post menopausal  ?                     do you still have your menstrual cycle  ? ?Is patient on blood thinner such as Coumadin, Plavix and/or Aspirin? yes ? ?Medications: asa 81 mg daily, amlodipine 5 mg daily, losartan 100 mg daily, chlorthalidone 25 mg 2 days per week, atorvastatin 20 mg 2 per week  ? ?Allergies: silicone ? ?Medication Adjustment per Dr Rehman/Dr Jenetta Downer asa 2 days ? ?Procedure date & time: 12/19/21 ? ? ?

## 2021-12-11 ENCOUNTER — Telehealth (INDEPENDENT_AMBULATORY_CARE_PROVIDER_SITE_OTHER): Payer: Self-pay

## 2021-12-11 MED ORDER — PEG 3350-KCL-NA BICARB-NACL 420 G PO SOLR: 4000.0000 mL | ORAL | 0 refills | Status: DC

## 2021-12-11 NOTE — Telephone Encounter (Signed)
Javi Bollman Ann Levaughn Puccinelli, CMA  ?

## 2021-12-17 ENCOUNTER — Other Ambulatory Visit (HOSPITAL_COMMUNITY)
Admission: RE | Admit: 2021-12-17 | Discharge: 2021-12-17 | Disposition: A | Discharge status: Home / Self Care | Payer: Medicare HMO | Source: Ambulatory Visit | Attending: Internal Medicine | Admitting: Internal Medicine

## 2021-12-17 DIAGNOSIS — Z86010 Personal history of colonic polyps: Secondary | ICD-10-CM | POA: Diagnosis present

## 2021-12-17 LAB — BASIC METABOLIC PANEL
Anion gap: 7 (ref 5–15)
BUN: 28 mg/dL — ABNORMAL HIGH (ref 8–23)
CO2: 24 mmol/L (ref 22–32)
Calcium: 8.9 mg/dL (ref 8.9–10.3)
Chloride: 108 mmol/L (ref 98–111)
Creatinine, Ser: 1.09 mg/dL (ref 0.61–1.24)
GFR, Estimated: >60 mL/min (ref >60)
Glucose, Bld: 108 mg/dL — ABNORMAL HIGH (ref 70–99)
Potassium: 4.4 mmol/L (ref 3.5–5.1)
Sodium: 139 mmol/L (ref 135–145)

## 2021-12-19 ENCOUNTER — Ambulatory Visit (HOSPITAL_COMMUNITY)
Admission: RE | Admit: 2021-12-19 | Discharge: 2021-12-19 | Disposition: A | Discharge status: Home / Self Care | Payer: Medicare HMO | Attending: Internal Medicine | Admitting: Internal Medicine

## 2021-12-19 ENCOUNTER — Ambulatory Visit (HOSPITAL_BASED_OUTPATIENT_CLINIC_OR_DEPARTMENT_OTHER): Payer: Medicare HMO | Admitting: Anesthesiology

## 2021-12-19 ENCOUNTER — Ambulatory Visit (HOSPITAL_COMMUNITY): Payer: Medicare HMO | Admitting: Anesthesiology

## 2021-12-19 ENCOUNTER — Other Ambulatory Visit: Payer: Self-pay

## 2021-12-19 ENCOUNTER — Encounter (HOSPITAL_COMMUNITY)
Admission: RE | Disposition: A | Discharge status: Home / Self Care | Payer: Self-pay | Source: Home / Self Care | Attending: Internal Medicine

## 2021-12-19 ENCOUNTER — Encounter (HOSPITAL_COMMUNITY): Payer: Self-pay | Admitting: Internal Medicine

## 2021-12-19 DIAGNOSIS — Z86010 Personal history of colonic polyps: Secondary | ICD-10-CM | POA: Insufficient documentation

## 2021-12-19 DIAGNOSIS — D125 Benign neoplasm of sigmoid colon: Secondary | ICD-10-CM | POA: Diagnosis not present

## 2021-12-19 DIAGNOSIS — Z87891 Personal history of nicotine dependence: Secondary | ICD-10-CM | POA: Diagnosis not present

## 2021-12-19 DIAGNOSIS — K635 Polyp of colon: Secondary | ICD-10-CM | POA: Diagnosis not present

## 2021-12-19 DIAGNOSIS — K5730 Diverticulosis of large intestine without perforation or abscess without bleeding: Secondary | ICD-10-CM | POA: Insufficient documentation

## 2021-12-19 DIAGNOSIS — C187 Malignant neoplasm of sigmoid colon: Secondary | ICD-10-CM | POA: Diagnosis not present

## 2021-12-19 DIAGNOSIS — Z79899 Other long term (current) drug therapy: Secondary | ICD-10-CM | POA: Diagnosis not present

## 2021-12-19 DIAGNOSIS — Z09 Encounter for follow-up examination after completed treatment for conditions other than malignant neoplasm: Secondary | ICD-10-CM

## 2021-12-19 DIAGNOSIS — K219 Gastro-esophageal reflux disease without esophagitis: Secondary | ICD-10-CM | POA: Diagnosis not present

## 2021-12-19 DIAGNOSIS — K644 Residual hemorrhoidal skin tags: Secondary | ICD-10-CM | POA: Insufficient documentation

## 2021-12-19 DIAGNOSIS — Z8371 Family history of colonic polyps: Secondary | ICD-10-CM | POA: Diagnosis not present

## 2021-12-19 DIAGNOSIS — Z85118 Personal history of other malignant neoplasm of bronchus and lung: Secondary | ICD-10-CM | POA: Diagnosis not present

## 2021-12-19 DIAGNOSIS — D124 Benign neoplasm of descending colon: Secondary | ICD-10-CM | POA: Insufficient documentation

## 2021-12-19 DIAGNOSIS — D123 Benign neoplasm of transverse colon: Secondary | ICD-10-CM | POA: Insufficient documentation

## 2021-12-19 DIAGNOSIS — C180 Malignant neoplasm of cecum: Secondary | ICD-10-CM | POA: Diagnosis not present

## 2021-12-19 DIAGNOSIS — I10 Essential (primary) hypertension: Secondary | ICD-10-CM | POA: Insufficient documentation

## 2021-12-19 DIAGNOSIS — J449 Chronic obstructive pulmonary disease, unspecified: Secondary | ICD-10-CM | POA: Diagnosis not present

## 2021-12-19 DIAGNOSIS — Z7982 Long term (current) use of aspirin: Secondary | ICD-10-CM | POA: Diagnosis not present

## 2021-12-19 HISTORY — PX: POLYPECTOMY: SHX5525

## 2021-12-19 HISTORY — PX: BIOPSY: SHX5522

## 2021-12-19 HISTORY — PX: COLONOSCOPY WITH PROPOFOL: SHX5780

## 2021-12-19 LAB — HM COLONOSCOPY

## 2021-12-19 SURGERY — COLONOSCOPY WITH PROPOFOL
Anesthesia: General

## 2021-12-19 MED ORDER — PROPOFOL 500 MG/50ML IV EMUL
INTRAVENOUS | Status: DC | PRN
  Administered 2021-12-19: 150 ug/kg/min via INTRAVENOUS

## 2021-12-19 MED ORDER — LIDOCAINE HCL (CARDIAC) PF 100 MG/5ML IV SOSY
PREFILLED_SYRINGE | INTRAVENOUS | Status: DC | PRN
  Administered 2021-12-19: 50 mg via INTRAVENOUS

## 2021-12-19 MED ORDER — LACTATED RINGERS IV SOLN: INTRAVENOUS | Status: DC

## 2021-12-19 MED ORDER — PHENYLEPHRINE HCL (PRESSORS) 10 MG/ML IV SOLN
INTRAVENOUS | Status: DC | PRN
  Administered 2021-12-19 (×3): 100 ug via INTRAVENOUS

## 2021-12-19 MED ORDER — EPHEDRINE SULFATE (PRESSORS) 50 MG/ML IJ SOLN
INTRAMUSCULAR | Status: DC | PRN
  Administered 2021-12-19 (×3): 5 mg via INTRAVENOUS

## 2021-12-19 MED ORDER — PROPOFOL 10 MG/ML IV BOLUS
INTRAVENOUS | Status: DC | PRN
  Administered 2021-12-19: 50 mg via INTRAVENOUS

## 2021-12-19 NOTE — H&P (Signed)
[name] Short is an 76 y.o. male.   ?Chief Complaint: Patient is here for colonoscopy ?HPI: Patient is 76 year old Caucasian male with history of multiple colonic polyps who is here for surveillance colonoscopy.  Last exam 3 years ago.  All in all he has had at least 15 polyps.  Most of these were tubular adenomas but he also had sessile serrated polyps removed.  He denies abdominal pain change in bowel habits or rectal bleeding. ?His brother has had colonic polyps and his daughter had colonoscopy last week and had 7 polyps removed.  She is 75 years old. ?Family history is negative for colon cancer. ?Aspirin is on hold. ? ?Past Medical History:  ?Diagnosis Date  ? Arthritis   ? ESP IN HANDS  ? Cancer [facility])   ? OCCULAR RIGHT EYE--SURGERY AND CHEMO  PT IS LEGALLY BLIND IN RT EYE  ? Cataract   ? LEFT EYE  ? COPD (chronic obstructive pulmonary disease) ([place])   ? FORMER SMOKER  ? Erectile dysfunction   ? GERD (gastroesophageal reflux disease)   ? Glaucoma   ? ONLY IN RIGHT EYE  ? Hypertension   ? Lung cancer ([place])   ? lung ca dx 02/2010  ? Paresthesia and pain of right extremity   ? RIGHT SHOULDER   ? Pneumonia 2011  ? Prostate cancer ([place])   ? prostate ca dx 11/11  ? Shortness of breath   ? ONLY WITH EXERTION; HX OF LOBECTOMY FOR CANER  ? ? ?Past Surgical History:  ?Procedure Laterality Date  ? APPENDECTOMY  02/1999  ? BACK SURGERY    ? CARDIAC CATHETERIZATION  2008  ? indication was due to LOC, chest tightness, diaphoreses; seen at [place] [facility]; PER PATIENT , NORMAL ; no records in epic ; endoreses repeat intermittent sx since time of cath   ? COLONOSCOPY  08/06/2012  ? Procedure: COLONOSCOPY;  Surgeon: Rogene Houston, MD;  [facility];  Service: Endoscopy;  Laterality: N/A;  955  ? COLONOSCOPY N/A 08/08/2015  ? Procedure: COLONOSCOPY;  Surgeon: Rogene Houston, MD;  [facility];  Service: Endoscopy;  Laterality: N/A;  1200  ? COLONOSCOPY N/A 10/21/2018  ? Procedure: COLONOSCOPY;  Surgeon:  Rogene Houston, MD;  [facility];  Service: Endoscopy;  Laterality: N/A;  1030  ? ELBOW SURGERY    ? EYE SURGERY    ? [place]; MAY AND JUNE 2009 RIGHT EYE SURGERY FOR CANCER-LENS HAS BEEN REMOVED.  ? LEFT UPPER LOBECTOMY  03/2010  ? FOR CANCER  ? PENILE PROSTHESIS IMPLANT  08/10/2012  ? Procedure: PENILE PROTHESIS INFLATABLE;  Surgeon: Malka So, MD;  [facility];  Service: Urology;  Laterality: N/A;  IMPLANT 3 PIECE PENILE PROSTHESIS ?  ? POLYPECTOMY  10/21/2018  ? Procedure: POLYPECTOMY;  Surgeon: Rogene Houston, MD;  [facility];  Service: Endoscopy;;  colon  ? PROSTATECTOMY  08/2010  ? FOR CANCER  ? TOTAL KNEE ARTHROPLASTY Left 09/10/2017  ? Procedure: LEFT TOTAL KNEE ARTHROPLASTY;  Surgeon: Susa Day, MD;  [facility];  Service: Orthopedics;  Laterality: Left;  120 mins  ? ? ?Family History  ?Problem Relation Age of Onset  ? Colon polyps Brother   ? Colon polyps Brother   ? Colon polyps Brother   ? Colon polyps Brother   ? Colon cancer Neg Hx   ? ?Social History:  reports that he quit smoking about 13 years ago.  His smoking use included cigarettes. He has a 40.00 pack-year smoking history. He quit smokeless tobacco use about 36 years ago.  His smokeless tobacco use included chew. He reports that he does not drink alcohol and does not use drugs. ? ?Allergies:  ?Allergies  ?Allergen Reactions  ? Silicone   ?  Caused redness  ? ? ?Medications Prior to Admission  ?Medication Sig Dispense Refill  ? amLODipine (NORVASC) 5 MG tablet Take 5 mg by mouth daily.    ? aspirin EC 81 MG tablet Take 1 tablet (81 mg total) by mouth daily.    ? atorvastatin (LIPITOR) 20 MG tablet Take 20 mg by mouth daily.    ? chlorthalidone (HYGROTON) 25 MG tablet Take 25 mg by mouth daily.     ? Cholecalciferol (VITAMIN D) 50 MCG (2000 UT) tablet Take 2,000 Units by mouth daily.    ? losartan (COZAAR) 100 MG tablet Take 100 mg by mouth daily.    ? omeprazole (PRILOSEC) 20  MG capsule Take 20 mg by mouth every other day. OTC    ? polyethylene glycol-electrolytes (TRILYTE) 420 g solution Take 4,000 mLs by mouth as directed. 4000 mL 0  ? acetaminophen (TYLENOL) 650 MG CR tablet Take 1,300 mg by mouth every morning.    ? ? ?No results found for this or any previous visit (from the past 48 hour(s)). ?No results found. ? ?Review of Systems ? ?Blood pressure 117/88, pulse 68, temperature 97.7 ?F (36.5 ?C), temperature source Oral, resp. rate 16, height 5\' 7"  (1.702 m), weight 82.6 kg, SpO2 100 %. ?Physical Exam ?HENT:  ?   Mouth/Throat:  ?   Mouth: Mucous membranes are moist.  ?   Pharynx: Oropharynx is clear.  ?Eyes:  ?   General: No scleral icterus. ?   Conjunctiva/sclera: Conjunctivae normal.  ?Cardiovascular:  ?   Rate and Rhythm: Normal rate and regular rhythm.  ?   Heart sounds: Normal heart sounds. No murmur heard. ?Pulmonary:  ?   Effort: Pulmonary effort is normal.  ?   Breath sounds: Normal breath sounds.  ?Abdominal:  ?   General: There is no distension.  ?   Palpations: Abdomen is soft. There is no mass.  ?   Tenderness: There is no abdominal tenderness.  ?Musculoskeletal:     ?   General: No swelling.  ?   Cervical back: Neck supple.  ?Lymphadenopathy:  ?   Cervical: No cervical adenopathy.  ?Skin: ?   General: Skin is warm and dry.  ?Neurological:  ?   Mental Status: He is alert.  ?  ? ?Assessment/Plan ? ?History of colonic polyps. ?Surveillance colonoscopy. ? ?Hildred Laser, MD ?12/19/2021, 12:18 PM ? ? ? ?

## 2021-12-19 NOTE — Op Note (Signed)
[facility] ?Patient Name: [name] Short ?Procedure Date: 12/19/2021 12:06 PM ?MRN: [id number] ?Date of Birth: Jul 26, 1946 ?Attending MD: Hildred Laser , MD ?CSN: [id number] ?Age: 76 ?Admit Type: Outpatient ?Procedure:                Colonoscopy ?Indications:              High risk colon cancer surveillance: Personal  ?                          history of colonic polyps ?Providers:                Hildred Laser, MD, Lambert Mody, Esko                          Risa Grill, Technician ?Referring MD:             Asencion Noble, MD ?Medicines:                Propofol per Anesthesia ?Complications:            No immediate complications. ?Estimated Blood Loss:     Estimated blood loss was minimal. ?Procedure:                Pre-Anesthesia Assessment: ?                          - Prior to the procedure, a History and Physical  ?                          was performed, and patient medications and  ?                          allergies were reviewed. The patient's tolerance of  ?                          previous anesthesia was also reviewed. The risks  ?                          and benefits of the procedure and the sedation  ?                          options and risks were discussed with the patient.  ?                          All questions were answered, and informed consent  ?                          was obtained. Prior Anticoagulants: The patient has  ?                          taken no previous anticoagulant or antiplatelet  ?                          agents except for aspirin. ASA Grade Assessment: II  ?                          -  A patient with mild systemic disease. After  ?                          reviewing the risks and benefits, the patient was  ?                          deemed in satisfactory condition to undergo the  ?                          procedure. ?                          After obtaining informed consent, the colonoscope  ?                          was passed under direct vision. Throughout  the  ?                          procedure, the patient's blood pressure, pulse, and  ?                          oxygen saturations were monitored continuously. The  ?                          PCF-HQ190L ([id number]) scope was introduced through  ?                          the anus and advanced to the the cecum, identified  ?                          by appendiceal orifice and ileocecal valve. The  ?                          colonoscopy was performed without difficulty. The  ?                          patient tolerated the procedure well. The quality  ?                          of the bowel preparation was good. The terminal  ?                          ileum, ileocecal valve, appendiceal orifice, and  ?                          rectum were photographed. ?Scope In: 12:39:14 PM ?Scope Out: 1:21:36 PM ?Scope Withdrawal Time: 0 hours 36 minutes 13 seconds  ?Total Procedure Duration: 0 hours 42 minutes 22 seconds  ?Findings: ?     The perianal and digital rectal examinations were normal. ?     A sessile and ulcerated non-obstructing medium-sized mass was found in  ?     the cecum. The mass was non-circumferential. The mass measured three cm  ?     in length. In addition, its diameter measured two cm. No bleeding was  ?  present. This was biopsied with a cold forceps for histology. The  ?     pathology specimen was placed into Bottle Number 1. ?     A small polyp was found in the distal transverse colon. Biopsies were  ?     taken with a cold forceps for histology. The pathology specimen was  ?     placed into Bottle Number 2. ?     Five polyps were found in the sigmoid colon and descending colon. The  ?     polyps were small in size. The pathology specimen was placed into Bottle  ?     Number 2. ?     A 5 to 10 mm polyp was found in the splenic flexure. The polyp was  ?     sessile. The polyp was removed with a cold snare. Resection and  ?     retrieval were complete. The pathology specimen was placed into Bottle  ?      Number 3. ?     A 7 mm polyp was found in the mid sigmoid colon. The polyp was  ?     semi-pedunculated. The polyp was removed with a cold snare. Resection  ?     and retrieval were complete. The pathology specimen was placed into  ?     Bottle Number 4. ?     A few diverticula were found in the sigmoid colon. ?     External hemorrhoids were found during retroflexion. The hemorrhoids  ?     were small. ?Impression:               - Likely malignant tumor in the cecum. Biopsied. ?                          - One small polyp in the distal transverse colon.  ?                          Biopsied. ?                          - Five small polyps in the sigmoid colon and in the  ?                          descending colon. ?                          - One 5 to 10 mm polyp at the splenic flexure,  ?                          removed with a cold snare. Resected and retrieved. ?                          - One 7 mm polyp in the mid sigmoid colon, removed  ?                          with a cold snare. Resected and retrieved. ?                          - Diverticulosis in the sigmoid colon. ?                          -  External hemorrhoids. ?Moderate Sedation: ?     Per Anesthesia Care ?Recommendation:           - Patient has a contact number available for  ?                          emergencies. The signs and symptoms of potential  ?                          delayed complications were discussed with the  ?                          patient. Return to normal activities tomorrow.  ?                          Written discharge instructions were provided to the  ?                          patient. ?                          - High fiber diet today. ?                          - Continue present medications. ?                          - No aspirin, ibuprofen, naproxen, or other  ?                          non-steroidal anti-inflammatory drugs for 3 days. ?                          - Await pathology results. ?                          -  Repeat colonoscopy is recommended. The  ?                          colonoscopy date will be determined after pathology  ?                          results from today's exam become available for  ?                          review. ?Procedure Code(s):        --- Professional --- ?                          [phone], Colonoscopy, flexible; with removal of  ?                          tumor(s), polyp(s), or other lesion(s) by snare  ?                          technique ?  [id number], 59, Colonoscopy, flexible; with biopsy,  ?                          single or multiple ?Diagnosis Code(s):        --- Professional --- ?                          K63.5, Polyp of colon ?                          Z86.010, Personal history of colonic polyps ?                          D49.0, Neoplasm of unspecified behavior of  ?                          digestive system ?                          K64.4, Residual hemorrhoidal skin tags ?                          K57.30, Diverticulosis of large intestine without  ?                          perforation or abscess without bleeding ?CPT copyright 2019 [employer] Association. All rights reserved. ?The codes documented in this report are preliminary and upon coder review may  ?be revised to meet current compliance requirements. ?Hildred Laser, MD ?Hildred Laser, MD ?12/19/2021 1:36:45 PM ?This report has been signed electronically. ?Number of Addenda: 0 ?

## 2021-12-19 NOTE — Anesthesia Preprocedure Evaluation (Addendum)
Anesthesia Evaluation  ?Patient identified by MRN, date of birth, ID band ?Patient awake ? ? ? ?Reviewed: ?Allergy & Precautions, NPO status , Patient's Chart, lab work & pertinent test results ? ?Airway ?Mallampati: II ? ?TM Distance: >3 FB ?Neck ROM: Full ? ? ? Dental ? ?(+) Dental Advisory Given, Upper Dentures ?  ?Pulmonary ?shortness of breath and with exertion, pneumonia, COPD, former smoker,  ?Left lung cancer, left upper lobectomy ?  ?breath sounds clear to auscultation ? ? ? ? ? ? Cardiovascular ?Exercise Tolerance: Good ?hypertension, Pt. on medications ?Normal cardiovascular exam ?Rhythm:Regular Rate:Normal ? ? ?  ?Neuro/Psych ?negative neurological ROS ? negative psych ROS  ? GI/Hepatic ?Neg liver ROS, GERD  Medicated and Controlled,  ?Endo/Other  ?negative endocrine ROS ? Renal/GU ?negative Renal ROS  ?negative genitourinary ?  ?Musculoskeletal ? ?(+) Arthritis , Osteoarthritis,   ? Abdominal ?  ?Peds ?negative pediatric ROS ?(+)  Hematology ?negative hematology ROS ?(+)   ?Anesthesia Other Findings ? ? Reproductive/Obstetrics ?negative OB ROS ? ?  ? ? ? ? ? ? ? ? ? ? ? ? ? ?  ?  ? ? ? ? ? ? ? ?Anesthesia Physical ?Anesthesia Plan ? ?ASA: 3 ? ?Anesthesia Plan: General  ? ?Post-op Pain Management: Minimal or no pain anticipated  ? ?Induction: Intravenous ? ?PONV Risk Score and Plan: Propofol infusion ? ?Airway Management Planned: Nasal Cannula and Natural Airway ? ?Additional Equipment:  ? ?Intra-op Plan:  ? ?Post-operative Plan:  ? ?Informed Consent: I have reviewed the patients History and Physical, chart, labs and discussed the procedure including the risks, benefits and alternatives for the proposed anesthesia with the patient or authorized representative who has indicated his/her understanding and acceptance.  ? ? ? ?Dental advisory given ? ?Plan Discussed with: CRNA and Surgeon ? ?Anesthesia Plan Comments:   ? ? ? ? ? ? ?Anesthesia Quick Evaluation ? ?

## 2021-12-19 NOTE — Transfer of Care (Signed)
Immediate Anesthesia Transfer of Care Note ? ?Patient: [name] Short ? ?Procedure(s) Performed: COLONOSCOPY WITH PROPOFOL ?BIOPSY ?POLYPECTOMY ? ?Patient Location: PACU ? ?Anesthesia Type:General ? ?Level of Consciousness: awake, alert , oriented and patient cooperative ? ?Airway & Oxygen Therapy: Patient Spontanous Breathing ? ?Post-op Assessment: Report given to RN, Post -op Vital signs reviewed and stable and Patient moving all extremities X 4 ? ?Post vital signs: Reviewed and stable ? ?Last Vitals:  ?Vitals Value Taken Time  ?BP 112/55 12/19/21 1327  ?Temp 36.4 ?C 12/19/21 1327  ?Pulse 64 12/19/21 1327  ?Resp 18 12/19/21 1327  ?SpO2 96 % 12/19/21 1327  ? ? ?Last Pain:  ?Vitals:  ? 12/19/21 1327  ?TempSrc: Oral  ?PainSc: 0-No pain  ?   ? ?Patients Stated Pain Goal: 8 (12/19/21 1117) ? ?Complications: No notable events documented. ?

## 2021-12-19 NOTE — Discharge Instructions (Signed)
No aspirin or NSAIDs for 3 days ?Resume scheduled medications as before. ?High-fiber diet. ?No driving for 24 hours. ?Physician will call with biopsy results and further recommendations. ?

## 2021-12-19 NOTE — Anesthesia Postprocedure Evaluation (Signed)
Anesthesia Post Note ? ?Patient: [name] Short ? ?Procedure(s) Performed: COLONOSCOPY WITH PROPOFOL ?BIOPSY ?POLYPECTOMY ? ?Patient location during evaluation: Endoscopy ?Anesthesia Type: General ?Level of consciousness: awake and alert and oriented ?Pain management: pain level controlled ?Vital Signs Assessment: post-procedure vital signs reviewed and stable ?Respiratory status: spontaneous breathing, nonlabored ventilation and respiratory function stable ?Cardiovascular status: blood pressure returned to baseline and stable ?Postop Assessment: no apparent nausea or vomiting ?Anesthetic complications: no ? ? ?No notable events documented. ? ? ?Last Vitals:  ?Vitals:  ? 12/19/21 1118 12/19/21 1327  ?BP: 117/88 (!) 112/55  ?Pulse:  64  ?Resp:  18  ?Temp:  (!) 36.4 ?C  ?SpO2:  96%  ?  ?Last Pain:  ?Vitals:  ? 12/19/21 1327  ?TempSrc: Oral  ?PainSc: 0-No pain  ? ? ?  ?  ?  ?  ?  ?  ? ?Asli Tokarski C Nyheim Seufert ? ? ? ? ?

## 2021-12-20 LAB — SURGICAL PATHOLOGY

## 2021-12-23 ENCOUNTER — Encounter (INDEPENDENT_AMBULATORY_CARE_PROVIDER_SITE_OTHER): Payer: Self-pay | Admitting: *Deleted

## 2021-12-23 ENCOUNTER — Other Ambulatory Visit (INDEPENDENT_AMBULATORY_CARE_PROVIDER_SITE_OTHER): Payer: Self-pay

## 2021-12-23 DIAGNOSIS — C180 Malignant neoplasm of cecum: Secondary | ICD-10-CM

## 2021-12-24 ENCOUNTER — Ambulatory Visit: Payer: Medicare HMO | Admitting: Podiatry

## 2021-12-24 ENCOUNTER — Encounter: Payer: Self-pay | Admitting: Podiatry

## 2021-12-24 ENCOUNTER — Ambulatory Visit (INDEPENDENT_AMBULATORY_CARE_PROVIDER_SITE_OTHER): Payer: Medicare HMO

## 2021-12-24 DIAGNOSIS — M722 Plantar fascial fibromatosis: Secondary | ICD-10-CM | POA: Diagnosis not present

## 2021-12-24 MED ORDER — TRIAMCINOLONE ACETONIDE 40 MG/ML IJ SUSP
20.0000 mg | Freq: Once | INTRAMUSCULAR | Status: AC
  Administered 2021-12-24: 20 mg

## 2021-12-24 MED ORDER — METHYLPREDNISOLONE 4 MG PO TBPK: ORAL_TABLET | ORAL | 0 refills | Status: DC

## 2021-12-24 MED ORDER — MELOXICAM 15 MG PO TABS: 15.0000 mg | ORAL_TABLET | Freq: Every day | ORAL | 3 refills | Status: DC

## 2021-12-24 NOTE — Patient Instructions (Signed)

## 2021-12-24 NOTE — Progress Notes (Signed)
?Subjective:  ?Patient ID: [name] Short, male    DOB: 30-Sep-1945,  MRN: [id number] ?HPI ?Chief Complaint  ?Patient presents with  ? Foot Pain  ?  Plantar heel left - aching x few months, AM pain, no treatment  ? New Patient (Initial Visit)  ? ? ?76 y.o. male presents with the above complaint.  ? ?ROS: Denies fever chills nausea vomiting muscle aches pains calf pain back pain chest pain shortness of breath. ? ?Past Medical History:  ?Diagnosis Date  ? Arthritis   ? ESP IN HANDS  ? Cancer [facility])   ? OCCULAR RIGHT EYE--SURGERY AND CHEMO  PT IS LEGALLY BLIND IN RT EYE  ? Cataract   ? LEFT EYE  ? COPD (chronic obstructive pulmonary disease) ([place])   ? FORMER SMOKER  ? Erectile dysfunction   ? GERD (gastroesophageal reflux disease)   ? Glaucoma   ? ONLY IN RIGHT EYE  ? Hypertension   ? Lung cancer ([place])   ? lung ca dx 02/2010  ? Paresthesia and pain of right extremity   ? RIGHT SHOULDER   ? Pneumonia 2011  ? Prostate cancer ([place])   ? prostate ca dx 11/11  ? Shortness of breath   ? ONLY WITH EXERTION; HX OF LOBECTOMY FOR CANER  ? ?Past Surgical History:  ?Procedure Laterality Date  ? APPENDECTOMY  02/1999  ? BACK SURGERY    ? CARDIAC CATHETERIZATION  2008  ? indication was due to LOC, chest tightness, diaphoreses; seen at [place] [facility]; PER PATIENT , NORMAL ; no records in epic ; endoreses repeat intermittent sx since time of cath   ? COLONOSCOPY  08/06/2012  ? Procedure: COLONOSCOPY;  Surgeon: Rogene Houston, MD;  Location: [facility];  Service: Endoscopy;  Laterality: N/A;  955  ? COLONOSCOPY N/A 08/08/2015  ? Procedure: COLONOSCOPY;  Surgeon: Rogene Houston, MD;  Location: [facility];  Service: Endoscopy;  Laterality: N/A;  1200  ? COLONOSCOPY N/A 10/21/2018  ? Procedure: COLONOSCOPY;  Surgeon: Rogene Houston, MD;  Location: [facility];  Service: Endoscopy;  Laterality: N/A;  1030  ? ELBOW SURGERY    ? EYE SURGERY    ? [place]; MAY AND JUNE 2009 RIGHT EYE SURGERY FOR CANCER-LENS  HAS BEEN REMOVED.  ? LEFT UPPER LOBECTOMY  03/2010  ? FOR CANCER  ? PENILE PROSTHESIS IMPLANT  08/10/2012  ? Procedure: PENILE PROTHESIS INFLATABLE;  Surgeon: Malka So, MD;  Location: [facility];  Service: Urology;  Laterality: N/A;  IMPLANT 3 PIECE PENILE PROSTHESIS ?  ? POLYPECTOMY  10/21/2018  ? Procedure: POLYPECTOMY;  Surgeon: Rogene Houston, MD;  Location: [facility];  Service: Endoscopy;;  colon  ? PROSTATECTOMY  08/2010  ? FOR CANCER  ? TOTAL KNEE ARTHROPLASTY Left 09/10/2017  ? Procedure: LEFT TOTAL KNEE ARTHROPLASTY;  Surgeon: Susa Day, MD;  Location: [facility];  Service: Orthopedics;  Laterality: Left;  120 mins  ? ? ?Current Outpatient Medications:  ?  acetaminophen (TYLENOL) 650 MG CR tablet, Take 1,300 mg by mouth every morning., Disp: , Rfl:  ?  amLODipine (NORVASC) 5 MG tablet, Take 5 mg by mouth daily., Disp: , Rfl:  ?  aspirin EC 81 MG tablet, Take 1 tablet (81 mg total) by mouth daily., Disp: 30 tablet, Rfl: 11 ?  atorvastatin (LIPITOR) 20 MG tablet, Take 20 mg by mouth daily., Disp: , Rfl:  ?  chlorthalidone (HYGROTON) 25 MG tablet, Take 25 mg by  mouth daily. , Disp: , Rfl:  ?  Cholecalciferol (VITAMIN D) 50 MCG (2000 UT) tablet, Take 2,000 Units by mouth daily., Disp: , Rfl:  ?  losartan (COZAAR) 100 MG tablet, Take 100 mg by mouth daily., Disp: , Rfl:  ?  NIFEdipine (ADALAT CC) 90 MG 24 hr tablet, , Disp: , Rfl:  ?  NYSTATIN powder, SMARTSIG:0.5 Gram(s) Topical Twice Daily, Disp: , Rfl:  ?  ofloxacin (OCUFLOX) 0.3 % ophthalmic solution, Place 1 drop into the left eye 4 (four) times daily., Disp: , Rfl:  ?  omeprazole (PRILOSEC) 20 MG capsule, Take 20 mg by mouth every other day. OTC, Disp: , Rfl:  ?  terbinafine (LAMISIL) 250 MG tablet, Take 250 mg by mouth daily., Disp: , Rfl:  ? ?Allergies  ?Allergen Reactions  ? Silicone   ?  Caused redness  ? ?Review of Systems ?Objective:  ?There were no vitals filed for this visit. ? ?General: Well developed, nourished, in no acute distress, alert  and oriented x3  ? ?Dermatological: Skin is warm, dry and supple bilateral. Nails x 10 are well maintained; remaining integument appears unremarkable at this time. There are no open sores, no preulcerative lesions, no rash or signs of infection present. ? ?Vascular: Dorsalis Pedis artery and Posterior Tibial artery pedal pulses are 2/4 bilateral with immedate capillary fill time. Pedal hair growth present. No varicosities and no lower extremity edema present bilateral.  ? ?Neruologic: Grossly intact via light touch bilateral. Vibratory intact via tuning fork bilateral. Protective threshold with Semmes Wienstein monofilament intact to all pedal sites bilateral. Patellar and Achilles deep tendon reflexes 2+ bilateral. No Babinski or clonus noted bilateral.  ? ?Musculoskeletal: No gross boney pedal deformities bilateral. No pain, crepitus, or limitation noted with foot and ankle range of motion bilateral. Muscular strength 5/5 in all groups tested bilateral.  Pain on palpation medial calcaneal tubercle of the left heel. ? ?Gait: Unassisted, Nonantalgic.  ? ? ?Radiographs: ? ?Radiographs taken today demonstrate an osseously mature individual with hallux abductovalgus deformity.  Otherwise he does have a soft tissue increase in density plantar fashion calcaneal insertion site of the soft tissues margins appear to be normal.  No acute findings. ? ?Assessment & Plan:  ? ?Assessment: Planter fasciitis left hallux valgus left ? ?Plan: Discussed etiology pathology conservative surgical therapies at this point time I injected the left heel 20 mg Kenalog 5 mg Marcaine point maximal tenderness left heel.  Tolerated procedure well without complication.  He was provided with both oral and written home-going instructions for the ice therapy shoe gear change modifications stretching exercises.  Started him on methylprednisolone and meloxicam.  I will follow-up with him in 1 month ? ? ? ? ?Kemari Narez T. Cohutta, DPM ?

## 2021-12-25 ENCOUNTER — Encounter (HOSPITAL_COMMUNITY): Payer: Self-pay | Admitting: Internal Medicine

## 2021-12-30 ENCOUNTER — Ambulatory Visit (HOSPITAL_BASED_OUTPATIENT_CLINIC_OR_DEPARTMENT_OTHER)
Admission: RE | Admit: 2021-12-30 | Discharge: 2021-12-30 | Disposition: A | Discharge status: Home / Self Care | Payer: Medicare HMO | Source: Ambulatory Visit | Attending: Internal Medicine | Admitting: Internal Medicine

## 2021-12-30 DIAGNOSIS — C180 Malignant neoplasm of cecum: Secondary | ICD-10-CM

## 2021-12-30 MED ORDER — IOHEXOL 300 MG/ML  SOLN
100.0000 mL | Freq: Once | INTRAMUSCULAR | Status: AC | PRN
  Administered 2021-12-30: 85 mL via INTRAVENOUS

## 2022-01-20 ENCOUNTER — Ambulatory Visit: Payer: Self-pay | Admitting: General Surgery

## 2022-01-20 DIAGNOSIS — C182 Malignant neoplasm of ascending colon: Secondary | ICD-10-CM

## 2022-01-20 NOTE — H&P (Signed)
? ? ?REFERRING PHYSICIAN:  Rehman, Bonnita Levan, MD ? ?PROVIDER:  Monico Blitz, MD ? ?MRN: [id number] ?DOB: October 15, 1945 ?DATE OF ENCOUNTER: 01/20/2022 ? ?Subjective  ? ?Chief Complaint: Colon Cancer ?  ? ? ?History of Present Illness: ?[name] Short is a 76 y.o. male who is seen today as an office consultation at the request of Dr. Laural Golden for evaluation of Colon Cancer ?.   ? ?76 year old male who presents to the office after undergoing a surveillance colonoscopy.  This was done due to a history of multiple polyps.  A sessile ulcerated nonobstructing mass was found in the cecum behind the ileocecal valve.  Biopsies were obtained.  Multiple polyps were found within the colon and were also removed.  Colon mass showed invasive mildly differentiated adenocarcinoma.  Remaining polyps showed tubular adenoma.  Patient underwent CT scan of the abdomen and pelvis.  This showed no signs of metastatic disease.  No masses were identified in the colon.  Past medical history significant for COPD (former smoker), hypertension and lung cancer status post left upper lobectomy. ? ? ?Review of Systems: ?A complete review of systems was obtained from the patient.  I have reviewed this information and discussed as appropriate with the patient.  See HPI as well for other ROS. ? ? ?Medical History: ?Past Medical History:  ?Diagnosis Date  ? Arthritis   ? GERD (gastroesophageal reflux disease)   ? History of cancer   ? Hyperlipidemia   ? Hypertension   ? Sleep apnea   ? ? ?There is no problem list on file for this patient. ? ? ?Past Surgical History:  ?Procedure Laterality Date  ? APPENDECTOMY    ? EYE TRAUMA    ? HERNIA REPAIR    ? JOINT REPLACEMENT    ? PROSTATECTOMY RETROPUBIC    ? SPINE SURGERY    ?  ? ?No Known Allergies ? ?Current Outpatient Medications on File Prior to Visit  ?Medication Sig Dispense Refill  ? amLODIPine (NORVASC) 5 MG tablet Take 5 mg by mouth every morning    ? atorvastatin (LIPITOR) 20 MG tablet atorvastatin 20  mg tablet    ? chlorthalidone 25 MG tablet chlorthalidone 25 mg tablet    ? losartan (COZAAR) 100 MG tablet losartan 100 mg tablet    ? omeprazole (PRILOSEC) 20 MG DR capsule Take by mouth    ? ?No current facility-administered medications on file prior to visit.  ? ? ?Family History  ?Problem Relation Age of Onset  ? High blood pressure (Hypertension) Sister   ? Skin cancer Brother   ? High blood pressure (Hypertension) Brother   ? Coronary Artery Disease (Blocked arteries around heart) Brother   ?  ? ?Social History  ? ?Tobacco Use  ?Smoking Status Former  ? Types: Cigarettes  ?Smokeless Tobacco Never  ?  ? ?Social History  ? ?Socioeconomic History  ? Marital status: Married  ?Tobacco Use  ? Smoking status: Former  ?  Types: Cigarettes  ? Smokeless tobacco: Never  ?Vaping Use  ? Vaping Use: Never used  ?Substance and Sexual Activity  ? Alcohol use: Never  ? Drug use: Never  ? ? ?Objective:  ? ? ?Vitals:  ? 01/20/22 1026  ?BP: 128/72  ?Pulse: 71  ?Temp: 36.6 ?C (97.9 ?F)  ?SpO2: 96%  ?Weight: 83.6 kg (184 lb 3.2 oz)  ?Height: 171.5 cm (5' 7.5")  ?  ? ?Exam ?Gen: NAD ?CV: RRR ?Lungs: CTA ?Abd: soft, nontender ? ? ? ?Labs, Imaging and  Diagnostic Testing: ?CT scan abdomen and pelvis images and report reviewed.  There appears to be a mass at the ileocecal valve. ? ?Assessment and Plan:  ?Diagnoses and all orders for this visit: ? ?Colon cancer, ascending (CMS-HCC) ? ?  ? ?76 year old male with a history of multiple colon polyps who presents to the office for evaluation of a newly diagnosed ascending colon cancer.  This appears to be located near the ileocecal valve on review of the colonoscopy report and CT scan.  It does not appear that it was tattooed.  He has no sign of metastatic disease.  We will get a CT scan of the chest and CEA level to complete his metastatic work-up.  We will plan on proceeding with a robotic assisted right colectomy.  We have discussed this in detail. ?The surgery and anatomy were described  to the patient as well as the risks of surgery and the possible complications.  These include: Bleeding, deep abdominal infections and possible wound complications such as hernia and infection, damage to adjacent structures, leak of surgical connections, which can lead to other surgeries and possibly an ostomy, possible need for other procedures, such as abscess drains in radiology, possible prolonged hospital stay, possible diarrhea from removal of part of the colon, possible constipation from narcotics, possible bowel, bladder or sexual dysfunction if having rectal surgery, prolonged fatigue/weakness or appetite loss, possible early recurrence of of disease, possible complications of their medical problems such as heart disease or arrhythmias or lung problems, death (less than 1%). I believe the patient understands and wishes to proceed with the surgery. ? ?Rosario Adie, MD ? ?Colorectal and General Surgery ?[facility]  ?

## 2022-01-23 ENCOUNTER — Ambulatory Visit: Payer: 59 | Admitting: Podiatry

## 2022-01-27 ENCOUNTER — Other Ambulatory Visit: Payer: Self-pay | Admitting: General Surgery

## 2022-01-27 DIAGNOSIS — C182 Malignant neoplasm of ascending colon: Secondary | ICD-10-CM

## 2022-01-30 ENCOUNTER — Ambulatory Visit
Admission: RE | Admit: 2022-01-30 | Discharge: 2022-01-30 | Disposition: A | Discharge status: Home / Self Care | Payer: Medicare HMO | Source: Ambulatory Visit | Attending: General Surgery | Admitting: General Surgery

## 2022-01-30 DIAGNOSIS — C182 Malignant neoplasm of ascending colon: Secondary | ICD-10-CM

## 2022-01-30 MED ORDER — IOPAMIDOL (ISOVUE-300) INJECTION 61%
75.0000 mL | Freq: Once | INTRAVENOUS | Status: AC | PRN
  Administered 2022-01-30: 75 mL via INTRAVENOUS

## 2022-02-19 ENCOUNTER — Inpatient Hospital Stay (HOSPITAL_COMMUNITY): Admission: RE | Admit: 2022-02-19 | Payer: Medicare HMO | Source: Ambulatory Visit

## 2022-02-19 NOTE — Patient Instructions (Addendum)
DUE TO COVID-19 ONLY TWO VISITORS  (aged 75 and older)  IS ALLOWED TO COME WITH YOU AND STAY IN THE WAITING ROOM ONLY DURING PRE OP AND PROCEDURE.   **NO VISITORS ARE ALLOWED IN THE SHORT STAY AREA OR RECOVERY ROOM!!**  IF YOU WILL BE ADMITTED INTO THE HOSPITAL YOU ARE ALLOWED ONLY FOUR SUPPORT PEOPLE DURING VISITATION HOURS ONLY (7 AM -8PM)   The support person(s) must pass our screening, gel in and out Visitors GUEST BADGE MUST BE WORN VISIBLY  One adult visitor may remain with you overnight and MUST be in the room by 8 P.M.   You are not required to [employer] often Do NOT share personal items Notify your provider if you are in close contact with someone who has COVID or you develop fever 100.4 or greater, new onset of sneezing, cough, sore throat, shortness of breath or body aches.        Your procedure is scheduled on:  03-06-22   Report to [facility] Main Entrance    Report to admitting at 11:30 AM   Call this number if you have problems the morning of surgery [phone]   Follow a clear liquid diet day of prep to prevent dehydration   You may have liquids until 10:45  AM DAY OF SURGERY  Clear Liquid Diet Water Black Coffee (sugar ok, NO MILK/CREAM OR CREAMERS)  Tea (sugar ok, NO MILK/CREAM OR CREAMERS) regular and decaf                             Plain Jell-O (NO RED)                                           Fruit ices (not with fruit pulp, NO RED)                                     Popsicles (NO RED)                                                                  Juice: apple, WHITE grape, WHITE cranberry Sports drinks like Gatorade (NO RED) Clear broth(vegetable,chicken,beef)   Drink 2 Pre-Surgery Clear Ensure the evening before surgery (have completed by 10 PM)                 The day of surgery:  Drink ONE (1) Pre-Surgery Clear Ensure at 10:45 AM the morning of surgery. Drink in one sitting. Do not sip.  This drink was given to you  during your hospital  pre-op appointment visit. Nothing else to drink after completing the Pre-Surgery Clear Ensure          If you have questions, please contact your surgeon's office.   FOLLOW BOWEL PREP AND ANY ADDITIONAL PRE OP INSTRUCTIONS YOU RECEIVED FROM YOUR SURGEON'S OFFICE!!!     Oral Hygiene is also important to reduce your risk of infection.  Remember - BRUSH YOUR TEETH THE MORNING OF SURGERY WITH YOUR REGULAR TOOTHPASTE   Do NOT smoke after Midnight   Take these medicines the morning of surgery with A SIP OF WATER: Amlodipine, Atorvastatin,  Omeprazole.  Okay to use Tylenol if needed   DO NOT [place]. PHARMACY WILL DISPENSE MEDICATIONS LISTED ON YOUR MEDICATION LIST TO YOU DURING YOUR ADMISSION [place]!                              You may not have any metal on your body including  jewelry, and body piercing             Do not wear  lotions, powders, cologne, or deodorant              Men may shave face and neck.    Contacts, dentures or bridgework may not be worn into surgery.   Bring small overnight bag day of surgery.  Do not bring valuables to the hospital. [place].   Special Instructions: Bring a copy of your healthcare power of attorney and living will documents the day of surgery if you haven't scanned them before.  Please read over the following fact sheets you were given: IF YOU HAVE QUESTIONS ABOUT YOUR PRE-OP INSTRUCTIONS PLEASE CALL [facility] - Preparing for Surgery Before surgery, you can play an important role.  Because skin is not sterile, your skin needs to be as free of germs as possible.  You can reduce the number of germs on your skin by washing with CHG (chlorahexidine gluconate) soap before surgery.  CHG is an antiseptic cleaner which kills germs and bonds with the skin to continue killing germs even after  washing. Please DO NOT use if you have an allergy to CHG or antibacterial soaps.  If your skin becomes reddened/irritated stop using the CHG and inform your nurse when you arrive at Short Stay. Do not shave (including legs and underarms) for at least 48 hours prior to the first CHG shower.  You may shave your face/neck.  Please follow these instructions carefully:  1.  Shower with CHG Soap the night before surgery and the  morning of surgery.  2.  If you choose to wash your hair, wash your hair first as usual with your normal  shampoo.  3.  After you shampoo, rinse your hair and body thoroughly to remove the shampoo.                             4.  Use CHG as you would any other liquid soap.  You can apply chg directly to the skin and wash.  Gently with a scrungie or clean washcloth.  5.  Apply the CHG Soap to your body ONLY FROM THE NECK DOWN.   Do   not use on face/ open                           Wound or open sores. Avoid contact with eyes, ears mouth and   genitals (private parts).                       Wash face,  Genitals (private parts) with your normal soap.  6.  Wash thoroughly, paying special attention to the area where your    surgery  will be performed.  7.  Thoroughly rinse your body with warm water from the neck down.  8.  DO NOT shower/wash with your normal soap after using and rinsing off the CHG Soap.                9.  Pat yourself dry with a clean towel.            10.  Wear clean pajamas.            11.  Place clean sheets on your bed the night of your first shower and do not  sleep with pets. Day of Surgery : Do not apply any lotions/deodorants the morning of surgery.  Please wear clean clothes to the hospital/surgery center.  FAILURE TO FOLLOW THESE INSTRUCTIONS MAY RESULT IN THE CANCELLATION OF YOUR SURGERY  PATIENT SIGNATURE_________________________________  NURSE  SIGNATURE__________________________________  ________________________________________________________________________     Adam Phenix  An incentive spirometer is a tool that can help keep your lungs clear and active. This tool measures how well you are filling your lungs with each breath. Taking long deep breaths may help reverse or decrease the chance of developing breathing (pulmonary) problems (especially infection) following: A long period of time when you are unable to move or be active. BEFORE THE PROCEDURE  If the spirometer includes an indicator to show your best effort, your nurse or respiratory therapist will set it to a desired goal. If possible, sit up straight or lean slightly forward. Try not to slouch. Hold the incentive spirometer in an upright position. INSTRUCTIONS FOR USE  Sit on the edge of your bed if possible, or sit up as far as you can in bed or on a chair. Hold the incentive spirometer in an upright position. Breathe out normally. Place the mouthpiece in your mouth and seal your lips tightly around it. Breathe in slowly and as deeply as possible, raising the piston or the ball toward the top of the column. Hold your breath for 3-5 seconds or for as long as possible. Allow the piston or ball to fall to the bottom of the column. Remove the mouthpiece from your mouth and breathe out normally. Rest for a few seconds and repeat Steps 1 through 7 at least 10 times every 1-2 hours when you are awake. Take your time and take a few normal breaths between deep breaths. The spirometer may include an indicator to show your best effort. Use the indicator as a goal to work toward during each repetition. After each set of 10 deep breaths, practice coughing to be sure your lungs are clear. If you have an incision (the cut made at the time of surgery), support your incision when coughing by placing a pillow or rolled up towels firmly against it. Once you are able to get out  of bed, walk around indoors and cough well. You may stop using the incentive spirometer when instructed by your caregiver.  RISKS AND COMPLICATIONS Take your time so you do not get dizzy or light-headed. If you are in pain, you may need to take or ask for pain medication before doing incentive spirometry. It is harder to take a deep breath if you are having pain. AFTER USE Rest and breathe slowly and easily. It can be helpful to keep track of a log of your progress. Your caregiver can provide you with a simple table to help  with this. If you are using the spirometer at home, follow these instructions: [place] IF:  You are having difficultly using the spirometer. You have trouble using the spirometer as often as instructed. Your pain medication is not giving enough relief while using the spirometer. You develop fever of 100.5 F (38.1 C) or higher. SEEK IMMEDIATE MEDICAL CARE IF:  You cough up bloody sputum that had not been present before. You develop fever of 102 F (38.9 C) or greater. You develop worsening pain at or near the incision site. MAKE SURE YOU:  Understand these instructions. Will watch your condition. Will get help right away if you are not doing well or get worse. Document Released: 01/12/2007 Document Revised: 11/24/2011 Document Reviewed: 03/15/2007 ExitCare Patient Information 2014 ExitCare, [place].   ________________________________________________________________________  WHAT IS A BLOOD TRANSFUSION? Blood Transfusion Information  A transfusion is the replacement of blood or some of its parts. Blood is made up of multiple cells which provide different functions. Red blood cells carry oxygen and are used for blood loss replacement. White blood cells fight against infection. Platelets control bleeding. Plasma helps clot blood. Other blood products are available for specialized needs, such as hemophilia or other clotting disorders. BEFORE THE TRANSFUSION   Who gives blood for transfusions?  Healthy volunteers who are fully evaluated to make sure their blood is safe. This is blood bank blood. Transfusion therapy is the safest it has ever been in the practice of medicine. Before blood is taken from a donor, a complete history is taken to make sure that person has no history of diseases nor engages in risky social behavior (examples are intravenous drug use or sexual activity with multiple partners). The donor's travel history is screened to minimize risk of transmitting infections, such as malaria. The donated blood is tested for signs of infectious diseases, such as HIV and hepatitis. The blood is then tested to be sure it is compatible with you in order to minimize the chance of a transfusion reaction. If you or a relative donates blood, this is often done in anticipation of surgery and is not appropriate for emergency situations. It takes many days to process the donated blood. RISKS AND COMPLICATIONS Although transfusion therapy is very safe and saves many lives, the main dangers of transfusion include:  Getting an infectious disease. Developing a transfusion reaction. This is an allergic reaction to something in the blood you were given. Every precaution is taken to prevent this. The decision to have a blood transfusion has been considered carefully by your caregiver before blood is given. Blood is not given unless the benefits outweigh the risks. AFTER THE TRANSFUSION Right after receiving a blood transfusion, you will usually feel much better and more energetic. This is especially true if your red blood cells have gotten low (anemic). The transfusion raises the level of the red blood cells which carry oxygen, and this usually causes an energy increase. The nurse administering the transfusion will monitor you carefully for complications. HOME CARE INSTRUCTIONS  No special instructions are needed after a transfusion. You may find your energy is  better. Speak with your caregiver about any limitations on activity for underlying diseases you may have. SEEK MEDICAL CARE IF:  Your condition is not improving after your transfusion. You develop redness or irritation at the intravenous (IV) site. SEEK IMMEDIATE MEDICAL CARE IF:  Any of the following symptoms occur over the next 12 hours: Shaking chills. You have a temperature by mouth above 102 F (  38.9 C), not controlled by medicine. Chest, back, or muscle pain. People around you feel you are not acting correctly or are confused. Shortness of breath or difficulty breathing. Dizziness and fainting. You get a rash or develop hives. You have a decrease in urine output. Your urine turns a dark color or changes to pink, red, or brown. Any of the following symptoms occur over the next 10 days: You have a temperature by mouth above 102 F (38.9 C), not controlled by medicine. Shortness of breath. Weakness after normal activity. The white part of the eye turns yellow (jaundice). You have a decrease in the amount of urine or are urinating less often. Your urine turns a dark color or changes to pink, red, or brown. Document Released: 08/29/2000 Document Revised: 11/24/2011 Document Reviewed: 04/17/2008 [facility] Patient Information 2014 [place], [place].  _______________________________________________________________________

## 2022-02-19 NOTE — Progress Notes (Addendum)
COVID Vaccine Completed: Yes  Date of COVID positive in last 90 days:  No  PCP - Asencion Noble, MD (note on chart)  Cardiologist - Jyl Heinz, MD (last OV 2018)  Chest x-ray - CT chest 01-30-22 Epic EKG - 02-27-22 Epic Stress Test - greater than 2 years Epic ECHO -N/A Cardiac Cath - greater than 2 years (2008) Pacemaker/ICD device last checked: Spinal Cord Stimulator:  Bowel Prep - Clear liquid diet day of prep, Miralax and Dulcolax and antibiotics.  Patient aware and has instructions  Sleep Study - Yes, +sleep apnea CPAP - No  Fasting Blood Sugar - N/A Checks Blood Sugar _____ times a day  Blood Thinner Instructions: Aspirin Instructions:  ASA 81.  To stop one week prior per patient  Last Dose:  Activity level:   Can go up a flight of stairs and perform activities of daily living without stopping and without symptoms of chest pain or shortness of breath.  Patient states that he has periodic shortness of breath due to COPD.    Anesthesia review: COPD with SOB  Patient denies shortness of breath, fever, cough and chest pain at PAT appointment  Patient verbalized understanding of instructions that were given to them at the PAT appointment. Patient was also instructed that they will need to review over the PAT instructions again at home before surgery.

## 2022-02-21 ENCOUNTER — Other Ambulatory Visit (HOSPITAL_COMMUNITY): Payer: Self-pay

## 2022-02-27 ENCOUNTER — Other Ambulatory Visit: Payer: Self-pay

## 2022-02-27 ENCOUNTER — Encounter (HOSPITAL_COMMUNITY)
Admission: RE | Admit: 2022-02-27 | Discharge: 2022-02-27 | Disposition: A | Discharge status: Home / Self Care | Payer: Medicare HMO | Source: Ambulatory Visit | Attending: General Surgery | Admitting: General Surgery

## 2022-02-27 ENCOUNTER — Encounter (HOSPITAL_COMMUNITY): Payer: Self-pay

## 2022-02-27 VITALS — BP 134/73 | HR 67 | Temp 98.5°F | Resp 16 | Ht 67.0 in | Wt 183.6 lb

## 2022-02-27 DIAGNOSIS — C182 Malignant neoplasm of ascending colon: Secondary | ICD-10-CM | POA: Insufficient documentation

## 2022-02-27 DIAGNOSIS — Z01818 Encounter for other preprocedural examination: Secondary | ICD-10-CM | POA: Insufficient documentation

## 2022-02-27 DIAGNOSIS — I2510 Atherosclerotic heart disease of native coronary artery without angina pectoris: Secondary | ICD-10-CM | POA: Insufficient documentation

## 2022-02-27 HISTORY — DX: Sleep apnea, unspecified: G47.30

## 2022-02-27 HISTORY — DX: Malignant neoplasm of colon, unspecified: C18.9

## 2022-02-27 LAB — CBC
HCT: 36.6 % — ABNORMAL LOW (ref 39.0–52.0)
Hemoglobin: 12.4 g/dL — ABNORMAL LOW (ref 13.0–17.0)
MCH: 31.3 pg (ref 26.0–34.0)
MCHC: 33.9 g/dL (ref 30.0–36.0)
MCV: 92.4 fL (ref 80.0–100.0)
Platelets: 296 10*3/uL (ref 150–400)
RBC: 3.96 MIL/uL — ABNORMAL LOW (ref 4.22–5.81)
RDW: 13.5 % (ref 11.5–15.5)
WBC: 6.8 10*3/uL (ref 4.0–10.5)
nRBC: 0 % (ref 0.0–0.2)

## 2022-02-27 LAB — BASIC METABOLIC PANEL
Anion gap: 5 (ref 5–15)
BUN: 36 mg/dL — ABNORMAL HIGH (ref 8–23)
CO2: 23 mmol/L (ref 22–32)
Calcium: 9.2 mg/dL (ref 8.9–10.3)
Chloride: 112 mmol/L — ABNORMAL HIGH (ref 98–111)
Creatinine, Ser: 1.58 mg/dL — ABNORMAL HIGH (ref 0.61–1.24)
GFR, Estimated: 46 mL/min — ABNORMAL LOW (ref >60)
Glucose, Bld: 103 mg/dL — ABNORMAL HIGH (ref 70–99)
Potassium: 4.7 mmol/L (ref 3.5–5.1)
Sodium: 140 mmol/L (ref 135–145)

## 2022-02-28 LAB — CEA: CEA: 44.1 ng/mL — ABNORMAL HIGH (ref 0.0–4.7)

## 2022-03-06 ENCOUNTER — Inpatient Hospital Stay (HOSPITAL_COMMUNITY): Payer: Medicare HMO | Admitting: Physician Assistant

## 2022-03-06 ENCOUNTER — Encounter (HOSPITAL_COMMUNITY): Payer: Self-pay | Admitting: General Surgery

## 2022-03-06 ENCOUNTER — Inpatient Hospital Stay (HOSPITAL_COMMUNITY)
Admission: RE | Admit: 2022-03-06 | Discharge: 2022-03-08 | DRG: 330 | Disposition: A | Discharge status: Home / Self Care | Payer: Medicare HMO | Attending: General Surgery | Admitting: General Surgery

## 2022-03-06 ENCOUNTER — Other Ambulatory Visit: Payer: Self-pay

## 2022-03-06 ENCOUNTER — Inpatient Hospital Stay (HOSPITAL_COMMUNITY): Payer: Medicare HMO | Admitting: Anesthesiology

## 2022-03-06 ENCOUNTER — Encounter (HOSPITAL_COMMUNITY)
Admission: RE | Disposition: A | Discharge status: Home / Self Care | Payer: Self-pay | Source: Home / Self Care | Attending: General Surgery

## 2022-03-06 DIAGNOSIS — Z87891 Personal history of nicotine dependence: Secondary | ICD-10-CM | POA: Diagnosis not present

## 2022-03-06 DIAGNOSIS — Z01818 Encounter for other preprocedural examination: Principal | ICD-10-CM

## 2022-03-06 DIAGNOSIS — M1990 Unspecified osteoarthritis, unspecified site: Secondary | ICD-10-CM | POA: Diagnosis present

## 2022-03-06 DIAGNOSIS — E785 Hyperlipidemia, unspecified: Secondary | ICD-10-CM | POA: Diagnosis present

## 2022-03-06 DIAGNOSIS — J449 Chronic obstructive pulmonary disease, unspecified: Secondary | ICD-10-CM

## 2022-03-06 DIAGNOSIS — I10 Essential (primary) hypertension: Secondary | ICD-10-CM

## 2022-03-06 DIAGNOSIS — G4730 Sleep apnea, unspecified: Secondary | ICD-10-CM | POA: Diagnosis present

## 2022-03-06 DIAGNOSIS — C182 Malignant neoplasm of ascending colon: Secondary | ICD-10-CM | POA: Diagnosis present

## 2022-03-06 DIAGNOSIS — Z808 Family history of malignant neoplasm of other organs or systems: Secondary | ICD-10-CM

## 2022-03-06 DIAGNOSIS — Z8546 Personal history of malignant neoplasm of prostate: Secondary | ICD-10-CM | POA: Diagnosis not present

## 2022-03-06 DIAGNOSIS — Z79899 Other long term (current) drug therapy: Secondary | ICD-10-CM

## 2022-03-06 DIAGNOSIS — C189 Malignant neoplasm of colon, unspecified: Secondary | ICD-10-CM | POA: Diagnosis present

## 2022-03-06 DIAGNOSIS — K219 Gastro-esophageal reflux disease without esophagitis: Secondary | ICD-10-CM | POA: Diagnosis present

## 2022-03-06 DIAGNOSIS — Z8249 Family history of ischemic heart disease and other diseases of the circulatory system: Secondary | ICD-10-CM | POA: Diagnosis not present

## 2022-03-06 DIAGNOSIS — D649 Anemia, unspecified: Secondary | ICD-10-CM | POA: Diagnosis not present

## 2022-03-06 DIAGNOSIS — Z85118 Personal history of other malignant neoplasm of bronchus and lung: Secondary | ICD-10-CM

## 2022-03-06 DIAGNOSIS — C180 Malignant neoplasm of cecum: Principal | ICD-10-CM | POA: Diagnosis present

## 2022-03-06 DIAGNOSIS — I2510 Atherosclerotic heart disease of native coronary artery without angina pectoris: Secondary | ICD-10-CM

## 2022-03-06 LAB — TYPE AND SCREEN
ABO/RH(D): O POS
Antibody Screen: NEGATIVE

## 2022-03-06 SURGERY — COLECTOMY, PARTIAL, ROBOT-ASSISTED, LAPAROSCOPIC
Anesthesia: General

## 2022-03-06 MED ORDER — BUPIVACAINE-EPINEPHRINE (PF) 0.25% -1:200000 IJ SOLN
INTRAMUSCULAR | Status: AC
  Filled 2022-03-06: qty 30

## 2022-03-06 MED ORDER — PHENYLEPHRINE HCL (PRESSORS) 10 MG/ML IV SOLN
INTRAVENOUS | Status: AC
  Filled 2022-03-06: qty 1

## 2022-03-06 MED ORDER — SACCHAROMYCES BOULARDII 250 MG PO CAPS
250.0000 mg | ORAL_CAPSULE | Freq: Two times a day (BID) | ORAL | Status: DC
  Administered 2022-03-06 – 2022-03-07 (×3): 250 mg via ORAL
  Filled 2022-03-06 (×3): qty 1

## 2022-03-06 MED ORDER — DIPHENHYDRAMINE HCL 12.5 MG/5ML PO ELIX: 12.5000 mg | ORAL_SOLUTION | Freq: Four times a day (QID) | ORAL | Status: DC | PRN

## 2022-03-06 MED ORDER — BUPIVACAINE LIPOSOME 1.3 % IJ SUSP
INTRAMUSCULAR | Status: DC | PRN
  Administered 2022-03-06: 20 mL

## 2022-03-06 MED ORDER — ALVIMOPAN 12 MG PO CAPS
12.0000 mg | ORAL_CAPSULE | ORAL | Status: AC
  Administered 2022-03-06: 12 mg via ORAL
  Filled 2022-03-06: qty 1

## 2022-03-06 MED ORDER — PROPOFOL 10 MG/ML IV BOLUS
INTRAVENOUS | Status: AC
Start: 2022-03-06 — End: ?
  Filled 2022-03-06: qty 20

## 2022-03-06 MED ORDER — KETAMINE HCL 10 MG/ML IJ SOLN
INTRAMUSCULAR | Status: DC | PRN
  Administered 2022-03-06 (×2): 10 mg via INTRAVENOUS
  Administered 2022-03-06: 30 mg via INTRAVENOUS

## 2022-03-06 MED ORDER — LIDOCAINE 2% (20 MG/ML) 5 ML SYRINGE
INTRAMUSCULAR | Status: DC | PRN
  Administered 2022-03-06: 100 mg via INTRAVENOUS
  Administered 2022-03-06: 1.5 mg/kg/h via INTRAVENOUS

## 2022-03-06 MED ORDER — FENTANYL CITRATE PF 50 MCG/ML IJ SOSY
PREFILLED_SYRINGE | INTRAMUSCULAR | Status: AC
  Filled 2022-03-06: qty 1

## 2022-03-06 MED ORDER — ONDANSETRON HCL 4 MG/2ML IJ SOLN: 4.0000 mg | Freq: Four times a day (QID) | INTRAMUSCULAR | Status: DC | PRN

## 2022-03-06 MED ORDER — GABAPENTIN 300 MG PO CAPS
300.0000 mg | ORAL_CAPSULE | Freq: Two times a day (BID) | ORAL | Status: DC
  Administered 2022-03-06 – 2022-03-07 (×3): 300 mg via ORAL
  Filled 2022-03-06 (×3): qty 1

## 2022-03-06 MED ORDER — BUPIVACAINE LIPOSOME 1.3 % IJ SUSP
INTRAMUSCULAR | Status: AC
  Filled 2022-03-06: qty 20

## 2022-03-06 MED ORDER — BUPIVACAINE-EPINEPHRINE 0.25% -1:200000 IJ SOLN
INTRAMUSCULAR | Status: DC | PRN
  Administered 2022-03-06: 30 mL

## 2022-03-06 MED ORDER — SUGAMMADEX SODIUM 200 MG/2ML IV SOLN
INTRAVENOUS | Status: DC | PRN
  Administered 2022-03-06: 200 mg via INTRAVENOUS

## 2022-03-06 MED ORDER — BISACODYL 5 MG PO TBEC
20.0000 mg | DELAYED_RELEASE_TABLET | Freq: Once | ORAL | Status: DC
  Filled 2022-03-06: qty 4

## 2022-03-06 MED ORDER — EPHEDRINE SULFATE-NACL 50-0.9 MG/10ML-% IV SOSY
PREFILLED_SYRINGE | INTRAVENOUS | Status: DC | PRN
  Administered 2022-03-06 (×3): 5 mg via INTRAVENOUS

## 2022-03-06 MED ORDER — DIPHENHYDRAMINE HCL 50 MG/ML IJ SOLN: 12.5000 mg | Freq: Four times a day (QID) | INTRAMUSCULAR | Status: DC | PRN

## 2022-03-06 MED ORDER — BUPIVACAINE LIPOSOME 1.3 % IJ SUSP: 20.0000 mL | Freq: Once | INTRAMUSCULAR | Status: DC

## 2022-03-06 MED ORDER — ONDANSETRON HCL 4 MG/2ML IJ SOLN
INTRAMUSCULAR | Status: DC | PRN
  Administered 2022-03-06: 4 mg via INTRAVENOUS

## 2022-03-06 MED ORDER — KCL IN DEXTROSE-NACL 20-5-0.45 MEQ/L-%-% IV SOLN
INTRAVENOUS | Status: DC
  Filled 2022-03-06 (×2): qty 1000

## 2022-03-06 MED ORDER — TRAMADOL HCL 50 MG PO TABS
50.0000 mg | ORAL_TABLET | Freq: Four times a day (QID) | ORAL | Status: DC | PRN
  Administered 2022-03-06: 50 mg via ORAL
  Filled 2022-03-06: qty 1

## 2022-03-06 MED ORDER — SODIUM CHLORIDE 0.9 % IV SOLN
2.0000 g | INTRAVENOUS | Status: AC
  Administered 2022-03-06: 2 g via INTRAVENOUS
  Filled 2022-03-06: qty 2

## 2022-03-06 MED ORDER — ENSURE PRE-SURGERY PO LIQD
296.0000 mL | Freq: Once | ORAL | Status: DC
  Filled 2022-03-06: qty 296

## 2022-03-06 MED ORDER — ACETAMINOPHEN 10 MG/ML IV SOLN
1000.0000 mg | Freq: Once | INTRAVENOUS | Status: DC | PRN
  Administered 2022-03-06: 1000 mg via INTRAVENOUS

## 2022-03-06 MED ORDER — FENTANYL CITRATE (PF) 250 MCG/5ML IJ SOLN
INTRAMUSCULAR | Status: DC | PRN
  Administered 2022-03-06 (×5): 50 ug via INTRAVENOUS

## 2022-03-06 MED ORDER — LIDOCAINE HCL (PF) 2 % IJ SOLN
INTRAMUSCULAR | Status: AC
  Filled 2022-03-06: qty 20

## 2022-03-06 MED ORDER — ACETAMINOPHEN 500 MG PO TABS
1000.0000 mg | ORAL_TABLET | ORAL | Status: AC
  Administered 2022-03-06: 1000 mg via ORAL
  Filled 2022-03-06: qty 2

## 2022-03-06 MED ORDER — DEXAMETHASONE SODIUM PHOSPHATE 10 MG/ML IJ SOLN
INTRAMUSCULAR | Status: DC | PRN
  Administered 2022-03-06: 10 mg via INTRAVENOUS

## 2022-03-06 MED ORDER — CHLORHEXIDINE GLUCONATE 0.12 % MT SOLN
15.0000 mL | Freq: Once | OROMUCOSAL | Status: AC
  Administered 2022-03-06: 15 mL via OROMUCOSAL

## 2022-03-06 MED ORDER — DEXAMETHASONE SODIUM PHOSPHATE 10 MG/ML IJ SOLN
INTRAMUSCULAR | Status: AC
  Filled 2022-03-06: qty 1

## 2022-03-06 MED ORDER — ROCURONIUM BROMIDE 10 MG/ML (PF) SYRINGE
PREFILLED_SYRINGE | INTRAVENOUS | Status: DC | PRN
  Administered 2022-03-06: 20 mg via INTRAVENOUS
  Administered 2022-03-06: 70 mg via INTRAVENOUS
  Administered 2022-03-06: 20 mg via INTRAVENOUS

## 2022-03-06 MED ORDER — CHLORTHALIDONE 25 MG PO TABS
25.0000 mg | ORAL_TABLET | Freq: Every day | ORAL | Status: DC
  Administered 2022-03-06 – 2022-03-07 (×2): 25 mg via ORAL
  Filled 2022-03-06 (×2): qty 1

## 2022-03-06 MED ORDER — ORAL CARE MOUTH RINSE: 15.0000 mL | Freq: Once | OROMUCOSAL | Status: AC

## 2022-03-06 MED ORDER — LACTATED RINGERS IV SOLN: INTRAVENOUS | Status: DC

## 2022-03-06 MED ORDER — ALVIMOPAN 12 MG PO CAPS
12.0000 mg | ORAL_CAPSULE | Freq: Two times a day (BID) | ORAL | Status: DC
  Administered 2022-03-07: 12 mg via ORAL
  Filled 2022-03-06 (×2): qty 1

## 2022-03-06 MED ORDER — ONDANSETRON HCL 4 MG PO TABS: 4.0000 mg | ORAL_TABLET | Freq: Four times a day (QID) | ORAL | Status: DC | PRN

## 2022-03-06 MED ORDER — PROPOFOL 10 MG/ML IV BOLUS
INTRAVENOUS | Status: DC | PRN
  Administered 2022-03-06: 120 mg via INTRAVENOUS

## 2022-03-06 MED ORDER — SODIUM CHLORIDE 0.9 % IV SOLN
2.0000 g | Freq: Two times a day (BID) | INTRAVENOUS | Status: AC
  Administered 2022-03-06: 2 g via INTRAVENOUS
  Filled 2022-03-06: qty 2

## 2022-03-06 MED ORDER — LOSARTAN POTASSIUM 50 MG PO TABS: 100.0000 mg | ORAL_TABLET | Freq: Every day | ORAL | Status: DC

## 2022-03-06 MED ORDER — HYDROMORPHONE HCL 1 MG/ML IJ SOLN: 0.5000 mg | INTRAMUSCULAR | Status: DC | PRN

## 2022-03-06 MED ORDER — KETAMINE HCL 50 MG/5ML IJ SOSY
PREFILLED_SYRINGE | INTRAMUSCULAR | Status: AC
  Filled 2022-03-06: qty 5

## 2022-03-06 MED ORDER — ONDANSETRON HCL 4 MG/2ML IJ SOLN
INTRAMUSCULAR | Status: AC
  Filled 2022-03-06: qty 2

## 2022-03-06 MED ORDER — GABAPENTIN 300 MG PO CAPS
300.0000 mg | ORAL_CAPSULE | ORAL | Status: AC
  Administered 2022-03-06: 300 mg via ORAL
  Filled 2022-03-06: qty 1

## 2022-03-06 MED ORDER — 0.9 % SODIUM CHLORIDE (POUR BTL) OPTIME
TOPICAL | Status: DC | PRN
  Administered 2022-03-06: 1000 mL

## 2022-03-06 MED ORDER — ROCURONIUM BROMIDE 10 MG/ML (PF) SYRINGE
PREFILLED_SYRINGE | INTRAVENOUS | Status: AC
  Filled 2022-03-06: qty 10

## 2022-03-06 MED ORDER — ENSURE PRE-SURGERY PO LIQD
592.0000 mL | Freq: Once | ORAL | Status: DC
  Filled 2022-03-06: qty 592

## 2022-03-06 MED ORDER — LIDOCAINE HCL (PF) 2 % IJ SOLN
INTRAMUSCULAR | Status: AC
  Filled 2022-03-06: qty 5

## 2022-03-06 MED ORDER — PANTOPRAZOLE SODIUM 40 MG PO TBEC
40.0000 mg | DELAYED_RELEASE_TABLET | Freq: Every day | ORAL | Status: DC
  Administered 2022-03-06 – 2022-03-07 (×2): 40 mg via ORAL
  Filled 2022-03-06 (×2): qty 1

## 2022-03-06 MED ORDER — ENSURE SURGERY PO LIQD
237.0000 mL | Freq: Two times a day (BID) | ORAL | Status: DC
  Administered 2022-03-07 (×2): 237 mL via ORAL

## 2022-03-06 MED ORDER — FENTANYL CITRATE PF 50 MCG/ML IJ SOSY
25.0000 ug | PREFILLED_SYRINGE | INTRAMUSCULAR | Status: DC | PRN
  Administered 2022-03-06 (×3): 50 ug via INTRAVENOUS

## 2022-03-06 MED ORDER — FENTANYL CITRATE (PF) 250 MCG/5ML IJ SOLN
INTRAMUSCULAR | Status: AC
  Filled 2022-03-06: qty 5

## 2022-03-06 MED ORDER — ALUM & MAG HYDROXIDE-SIMETH 200-200-20 MG/5ML PO SUSP: 30.0000 mL | Freq: Four times a day (QID) | ORAL | Status: DC | PRN

## 2022-03-06 MED ORDER — ONDANSETRON HCL 4 MG/2ML IJ SOLN
4.0000 mg | Freq: Once | INTRAMUSCULAR | Status: AC | PRN
  Administered 2022-03-06: 4 mg via INTRAVENOUS

## 2022-03-06 MED ORDER — LACTATED RINGERS IV SOLN: INTRAVENOUS | Status: DC | PRN

## 2022-03-06 MED ORDER — ACETAMINOPHEN 500 MG PO TABS
1000.0000 mg | ORAL_TABLET | Freq: Four times a day (QID) | ORAL | Status: DC
  Administered 2022-03-07 – 2022-03-08 (×5): 1000 mg via ORAL
  Filled 2022-03-06 (×7): qty 2

## 2022-03-06 MED ORDER — AMLODIPINE BESYLATE 5 MG PO TABS
5.0000 mg | ORAL_TABLET | Freq: Every day | ORAL | Status: DC
  Administered 2022-03-07: 5 mg via ORAL
  Filled 2022-03-06: qty 1

## 2022-03-06 MED ORDER — POLYETHYLENE GLYCOL 3350 17 GM/SCOOP PO POWD
1.0000 | Freq: Once | ORAL | Status: DC
  Filled 2022-03-06: qty 255

## 2022-03-06 MED ORDER — ACETAMINOPHEN 10 MG/ML IV SOLN
INTRAVENOUS | Status: AC
  Filled 2022-03-06: qty 100

## 2022-03-06 MED ORDER — ENOXAPARIN SODIUM 40 MG/0.4ML IJ SOSY
40.0000 mg | PREFILLED_SYRINGE | INTRAMUSCULAR | Status: DC
  Administered 2022-03-07: 40 mg via SUBCUTANEOUS
  Filled 2022-03-06 (×2): qty 0.4

## 2022-03-06 MED ORDER — LOSARTAN POTASSIUM 50 MG PO TABS
100.0000 mg | ORAL_TABLET | Freq: Every day | ORAL | Status: DC
  Administered 2022-03-07: 100 mg via ORAL
  Filled 2022-03-06: qty 2

## 2022-03-06 SURGICAL SUPPLY — 99 items
BAG COUNTER SPONGE SURGICOUNT (BAG) ×2 IMPLANT
BAG SPNG CNTER NS LX DISP (BAG) ×1
BLADE EXTENDED COATED 6.5IN (ELECTRODE) IMPLANT
CANNULA REDUC XI 12-8 STAPL (CANNULA)
CANNULA REDUCER 12-8 DVNC XI (CANNULA) IMPLANT
CELLS DAT CNTRL 66122 CELL SVR (MISCELLANEOUS) ×1 IMPLANT
COVER SURGICAL LIGHT HANDLE (MISCELLANEOUS) ×4 IMPLANT
COVER TIP SHEARS 8 DVNC (MISCELLANEOUS) ×1 IMPLANT
COVER TIP SHEARS 8MM DA VINCI (MISCELLANEOUS) ×2
DRAIN CHANNEL 19F RND (DRAIN) IMPLANT
DRAPE ARM DVNC X/XI (DISPOSABLE) ×4 IMPLANT
DRAPE COLUMN DVNC XI (DISPOSABLE) ×1 IMPLANT
DRAPE DA VINCI XI ARM (DISPOSABLE) ×8
DRAPE DA VINCI XI COLUMN (DISPOSABLE) ×2
DRAPE SURG IRRIG POUCH 19X23 (DRAPES) ×2 IMPLANT
DRSG OPSITE POSTOP 4X10 (GAUZE/BANDAGES/DRESSINGS) IMPLANT
DRSG OPSITE POSTOP 4X6 (GAUZE/BANDAGES/DRESSINGS) IMPLANT
DRSG OPSITE POSTOP 4X8 (GAUZE/BANDAGES/DRESSINGS) IMPLANT
ELECT PENCIL ROCKER SW 15FT (MISCELLANEOUS) ×2 IMPLANT
ELECT REM PT RETURN 15FT ADLT (MISCELLANEOUS) ×2 IMPLANT
ENDOLOOP SUT PDS II  0 18 (SUTURE)
ENDOLOOP SUT PDS II 0 18 (SUTURE) IMPLANT
EVACUATOR SILICONE 100CC (DRAIN) IMPLANT
GLOVE BIO SURGEON STRL SZ 6.5 (GLOVE) ×6 IMPLANT
GLOVE BIOGEL PI IND STRL 7.0 (GLOVE) ×2 IMPLANT
GLOVE BIOGEL PI INDICATOR 7.0 (GLOVE) ×2
GLOVE INDICATOR 6.5 STRL GRN (GLOVE) ×2 IMPLANT
GOWN SRG XL LVL 4 BRTHBL STRL (GOWNS) ×1 IMPLANT
GOWN STRL NON-REIN XL LVL4 (GOWNS) ×2
GOWN STRL REUS W/ TWL XL LVL3 (GOWN DISPOSABLE) ×3 IMPLANT
GOWN STRL REUS W/TWL XL LVL3 (GOWN DISPOSABLE) ×6
GRASPER SUT TROCAR 14GX15 (MISCELLANEOUS) IMPLANT
HOLDER FOLEY CATH W/STRAP (MISCELLANEOUS) ×2 IMPLANT
IRRIG SUCT STRYKERFLOW 2 WTIP (MISCELLANEOUS) ×2
IRRIGATION SUCT STRKRFLW 2 WTP (MISCELLANEOUS) ×1 IMPLANT
KIT PROCEDURE DA VINCI SI (MISCELLANEOUS)
KIT PROCEDURE DVNC SI (MISCELLANEOUS) IMPLANT
KIT TURNOVER KIT A (KITS) IMPLANT
NDL INSUFFLATION 14GA 120MM (NEEDLE) ×1 IMPLANT
NEEDLE INSUFFLATION 14GA 120MM (NEEDLE) ×2 IMPLANT
PACK CARDIOVASCULAR III (CUSTOM PROCEDURE TRAY) ×2 IMPLANT
PACK COLON (CUSTOM PROCEDURE TRAY) ×2 IMPLANT
PAD POSITIONING PINK XL (MISCELLANEOUS) ×2 IMPLANT
RELOAD STAPLE 60 2.5 WHT DVNC (STAPLE) IMPLANT
RELOAD STAPLE 60 3.5 BLU DVNC (STAPLE) IMPLANT
RELOAD STAPLE 60 4.3 GRN DVNC (STAPLE) IMPLANT
RELOAD STAPLER 2.5X60 WHT DVNC (STAPLE) ×1 IMPLANT
RELOAD STAPLER 3.5X60 BLU DVNC (STAPLE) ×1 IMPLANT
RELOAD STAPLER 4.3X60 GRN DVNC (STAPLE) ×2 IMPLANT
RETRACTOR WND ALEXIS 18 MED (MISCELLANEOUS) IMPLANT
RTRCTR WOUND ALEXIS 18CM MED (MISCELLANEOUS) ×2
SCISSORS LAP 5X35 DISP (ENDOMECHANICALS) IMPLANT
SEAL CANN UNIV 5-8 DVNC XI (MISCELLANEOUS) ×3 IMPLANT
SEAL XI 5MM-8MM UNIVERSAL (MISCELLANEOUS) ×8
SEALER VESSEL DA VINCI XI (MISCELLANEOUS)
SEALER VESSEL EXT DVNC XI (MISCELLANEOUS) ×1 IMPLANT
SOLUTION ELECTROLUBE (MISCELLANEOUS) ×1 IMPLANT
SPIKE FLUID TRANSFER (MISCELLANEOUS) IMPLANT
STAPLER 60 DA VINCI SURE FORM (STAPLE) ×2
STAPLER 60 SUREFORM DVNC (STAPLE) IMPLANT
STAPLER CANNULA SEAL DVNC XI (STAPLE) IMPLANT
STAPLER CANNULA SEAL XI (STAPLE)
STAPLER ECHELON POWER CIR 29 (STAPLE) IMPLANT
STAPLER ECHELON POWER CIR 31 (STAPLE) IMPLANT
STAPLER RELOAD 2.5X60 WHITE (STAPLE) ×2
STAPLER RELOAD 2.5X60 WHT DVNC (STAPLE) ×1
STAPLER RELOAD 3.5X60 BLU DVNC (STAPLE) ×1
STAPLER RELOAD 3.5X60 BLUE (STAPLE) ×2
STAPLER RELOAD 4.3X60 GREEN (STAPLE) ×4
STAPLER RELOAD 4.3X60 GRN DVNC (STAPLE) ×2
STOPCOCK 4 WAY LG BORE MALE ST (IV SETS) ×2 IMPLANT
SUT ETHILON 2 0 PS N (SUTURE) IMPLANT
SUT NOVA NAB DX-16 0-1 5-0 T12 (SUTURE) ×2 IMPLANT
SUT PROLENE 2 0 KS (SUTURE) IMPLANT
SUT SILK 2 0 (SUTURE) ×2
SUT SILK 2 0 SH CR/8 (SUTURE) IMPLANT
SUT SILK 2-0 18XBRD TIE 12 (SUTURE) ×1 IMPLANT
SUT SILK 3 0 (SUTURE)
SUT SILK 3 0 SH CR/8 (SUTURE) ×1 IMPLANT
SUT SILK 3-0 18XBRD TIE 12 (SUTURE) IMPLANT
SUT V-LOC BARB 180 2/0GR6 GS22 (SUTURE) ×4
SUT VIC AB 2-0 SH 18 (SUTURE) IMPLANT
SUT VIC AB 2-0 SH 27 (SUTURE)
SUT VIC AB 2-0 SH 27X BRD (SUTURE) IMPLANT
SUT VIC AB 3-0 SH 18 (SUTURE) IMPLANT
SUT VIC AB 4-0 PS2 27 (SUTURE) ×4 IMPLANT
SUT VICRYL 0 UR6 27IN ABS (SUTURE) ×2 IMPLANT
SUTURE V-LC BRB 180 2/0GR6GS22 (SUTURE) IMPLANT
SYR 10ML ECCENTRIC (SYRINGE) ×2 IMPLANT
SYS LAPSCP GELPORT 120MM (MISCELLANEOUS)
SYS WOUND ALEXIS 18CM MED (MISCELLANEOUS)
SYSTEM LAPSCP GELPORT 120MM (MISCELLANEOUS) IMPLANT
SYSTEM WOUND ALEXIS 18CM MED (MISCELLANEOUS) IMPLANT
TOWEL OR 17X26 10 PK STRL BLUE (TOWEL DISPOSABLE) IMPLANT
TOWEL OR NON WOVEN STRL DISP B (DISPOSABLE) ×2 IMPLANT
TRAY FOLEY MTR SLVR 16FR STAT (SET/KITS/TRAYS/PACK) ×2 IMPLANT
TROCAR ADV FIXATION 5X100MM (TROCAR) ×2 IMPLANT
TUBING CONNECTING 10 (TUBING) ×4 IMPLANT
TUBING INSUFFLATION 10FT LAP (TUBING) ×2 IMPLANT

## 2022-03-06 NOTE — Anesthesia Procedure Notes (Addendum)
Procedure Name: Intubation Date/Time: 03/06/2022 1:08 PM  Performed by: Sharlette Dense, CRNAPre-anesthesia Checklist: Patient identified, Emergency Drugs available, Suction available and Patient being monitored Patient Re-evaluated:Patient Re-evaluated prior to induction Oxygen Delivery Method: Circle system utilized Preoxygenation: Pre-oxygenation with 100% oxygen Induction Type: IV induction Ventilation: Mask ventilation without difficulty and Oral airway inserted - appropriate to patient size Laryngoscope Size: Miller and 3 Grade View: Grade I Tube type: Oral Tube size: 8.0 mm Number of attempts: 1 Airway Equipment and Method: Stylet Placement Confirmation: ETT inserted through vocal cords under direct vision, positive ETCO2 and breath sounds checked- equal and bilateral Secured at: 22 cm Tube secured with: Tape Dental Injury: Teeth and Oropharynx as per pre-operative assessment

## 2022-03-06 NOTE — H&P (Signed)
REFERRING PHYSICIAN:  Saverio Danker, MD   PROVIDER:  Monico Blitz, MD   MRN: [id number] DOB: Jul 01, 1946 DATE OF ENCOUNTER: 01/20/2022   Subjective    Chief Complaint: Colon Cancer       History of Present Illness: [name] Short is a 76 y.o. male who is seen today as an office consultation at the request of Dr. Laural Golden for evaluation of Colon Cancer .     76 year old male who presents to the office after undergoing a surveillance colonoscopy.  This was done due to a history of multiple polyps.  A sessile ulcerated nonobstructing mass was found in the cecum behind the ileocecal valve.  Biopsies were obtained.  Multiple polyps were found within the colon and were also removed.  Colon mass showed invasive mildly differentiated adenocarcinoma.  Remaining polyps showed tubular adenoma.  Patient underwent CT scan of the abdomen and pelvis.  This showed no signs of metastatic disease.  No masses were identified in the colon.  Past medical history significant for COPD (former smoker), hypertension and lung cancer status post left upper lobectomy.     Review of Systems: A complete review of systems was obtained from the patient.  I have reviewed this information and discussed as appropriate with the patient.  See HPI as well for other ROS.     Medical History:     Past Medical History:  Diagnosis Date   Arthritis     GERD (gastroesophageal reflux disease)     History of cancer     Hyperlipidemia     Hypertension     Sleep apnea        There is no problem list on file for this patient.          Past Surgical History:  Procedure Laterality Date   APPENDECTOMY       EYE TRAUMA       HERNIA REPAIR       JOINT REPLACEMENT       PROSTATECTOMY RETROPUBIC       SPINE SURGERY          No Known Allergies         Current Outpatient Medications on File Prior to Visit  Medication Sig Dispense Refill   amLODIPine (NORVASC) 5 MG tablet Take 5 mg by mouth every morning        atorvastatin (LIPITOR) 20 MG tablet atorvastatin 20 mg tablet       chlorthalidone 25 MG tablet chlorthalidone 25 mg tablet       losartan (COZAAR) 100 MG tablet losartan 100 mg tablet       omeprazole (PRILOSEC) 20 MG DR capsule Take by mouth        No current facility-administered medications on file prior to visit.           Family History  Problem Relation Age of Onset   High blood pressure (Hypertension) Sister     Skin cancer Brother     High blood pressure (Hypertension) Brother     Coronary Artery Disease (Blocked arteries around heart) Brother        Social History        Tobacco Use  Smoking Status Former   Types: Cigarettes  Smokeless Tobacco Never      Social History         Socioeconomic History   Marital status: Married  Tobacco Use   Smoking status: Former      Types: Cigarettes   Smokeless tobacco: Never  Vaping Use   Vaping Use: Never used  Substance and Sexual Activity   Alcohol use: Never   Drug use: Never      Objective:      Today's Vitals   03/06/22 1129 03/06/22 1148  BP: (!) 141/76   Pulse: 76   Resp: 16   Temp: 97.6 F (36.4 C)   TempSrc: Oral   SpO2: 100%   Weight:  83.2 kg  Height:  5\' 7"  (1.702 m)   Body mass index is 28.73 kg/m.    Exam Gen: NAD CV: RRR Lungs: CTA Abd: soft, nontender       Labs, Imaging and Diagnostic Testing: CT scan abdomen and pelvis images and report reviewed.  There appears to be a mass at the ileocecal valve.   Assessment and Plan:  Diagnoses and all orders for this visit:   Colon cancer, ascending (CMS-HCC)       76 year old male with a history of multiple colon polyps who presents to the office for evaluation of a newly diagnosed ascending colon cancer.  This appears to be located near the ileocecal valve on review of the colonoscopy report and CT scan.  It does not appear that it was tattooed.  He has no sign of metastatic disease.  We will get a CT scan of the chest and CEA level to  complete his metastatic work-up.  We will plan on proceeding with a robotic assisted right colectomy.  We have discussed this in detail. The surgery and anatomy were described to the patient as well as the risks of surgery and the possible complications.  These include: Bleeding, deep abdominal infections and possible wound complications such as hernia and infection, damage to adjacent structures, leak of surgical connections, which can lead to other surgeries and possibly an ostomy, possible need for other procedures, such as abscess drains in radiology, possible prolonged hospital stay, possible diarrhea from removal of part of the colon, possible constipation from narcotics, possible bowel, bladder or sexual dysfunction if having rectal surgery, prolonged fatigue/weakness or appetite loss, possible early recurrence of of disease, possible complications of their medical problems such as heart disease or arrhythmias or lung problems, death (less than 1%). I believe the patient understands and wishes to proceed with the surgery.   Rosario Adie, MD   [facility]

## 2022-03-06 NOTE — Anesthesia Preprocedure Evaluation (Addendum)
Anesthesia Evaluation  Patient identified by MRN, date of birth, ID band Patient awake    Reviewed: Allergy & Precautions, NPO status , Patient's Chart, lab work & pertinent test results  Airway Mallampati: II  TM Distance: >3 FB Neck ROM: Full    Dental no notable dental hx. (+) Edentulous Upper, Missing, Dental Advisory Given,    Pulmonary sleep apnea (does not believe he needs it) , COPD, former smoker,  Hx of Lung Ca S/P L UL   Pulmonary exam normal breath sounds clear to auscultation       Cardiovascular hypertension, Normal cardiovascular exam Rhythm:Regular Rate:Normal     Neuro/Psych    GI/Hepatic GERD  ,Colon CA   Endo/Other    Renal/GU Lab Results      Component                Value               Date                      CREATININE               1.58 (H)            02/27/2022               K                        4.7                 02/27/2022                    Musculoskeletal  (+) Arthritis , Osteoarthritis,    Abdominal   Peds  Hematology  (+) Blood dyscrasia, anemia , Lab Results      Component                Value               Date                            HGB                      12.4 (L)            02/27/2022                HCT                      36.6 (L)            02/27/2022                PLT                      296                 02/27/2022              Anesthesia Other Findings 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" pet pat  Reproductive/Obstetrics                            Anesthesia Physical Anesthesia Plan  ASA: 3  Anesthesia Plan: General   Post-op Pain Management: Lidocaine infusion* and Ketamine IV*  Induction: Intravenous  PONV Risk Score and Plan: Treatment may vary due to age or medical condition and Ondansetron  Airway Management Planned: Oral ETT  Additional Equipment: None  Intra-op Plan:   Post-operative Plan: Extubation in  OR  Informed Consent: I have reviewed the patients History and Physical, chart, labs and discussed the procedure including the risks, benefits and alternatives for the proposed anesthesia with the patient or authorized representative who has indicated his/her understanding and acceptance.     Dental advisory given  Plan Discussed with:   Anesthesia Plan Comments:        Anesthesia Quick Evaluation

## 2022-03-06 NOTE — Transfer of Care (Signed)
Immediate Anesthesia Transfer of Care Note  Patient: [name] Short  Procedure(s) Performed: XI ROBOT ASSISTED LAPAROSCOPIC PARTIAL RIGHT COLECTOMY  Patient Location: PACU  Anesthesia Type:General  Level of Consciousness: sedated  Airway & Oxygen Therapy: Patient Spontanous Breathing and Patient connected to face mask oxygen  Post-op Assessment: Report given to RN and Post -op Vital signs reviewed and stable  Post vital signs: Reviewed and stable  Last Vitals:  Vitals Value Taken Time  BP 122/73 03/06/22 1545  Temp    Pulse 91 03/06/22 1546  Resp 22 03/06/22 1546  SpO2 99 % 03/06/22 1546  Vitals shown include unvalidated device data.  Last Pain:  Vitals:   03/06/22 1129  TempSrc: Oral         Complications: No notable events documented.

## 2022-03-06 NOTE — Op Note (Signed)
03/06/2022  3:33 PM  PATIENT:  [name] Short  76 y.o. male  Patient Care Team: Asencion Noble, MD as PCP - General (Internal Medicine)  PRE-OPERATIVE DIAGNOSIS:  COLON CANCER  POST-OPERATIVE DIAGNOSIS:  COLON CANCER  PROCEDURE:  XI ROBOT ASSISTED PARTIAL RIGHT COLECTOMY   Surgeon(s): Ileana Roup, MD Leighton Ruff, MD  ASSISTANT: Dr Dema Severin   ANESTHESIA:   local and general  EBL: 50 ml Total I/O In: -  Out: 60 [Blood:50]  Delay start of Pharmacological VTE agent (>24hrs) due to surgical blood loss or risk of bleeding:  no  DRAINS: none   SPECIMEN:  Source of Specimen:  R colon  DISPOSITION OF SPECIMEN:  PATHOLOGY  COUNTS:  YES  PLAN OF CARE: Admit to inpatient   PATIENT DISPOSITION:  PACU - hemodynamically stable.  INDICATION:    76 y.o. M with cecal adenocarcinoma.  I recommended segmental resection:  The anatomy & physiology of the digestive tract was discussed.  The pathophysiology was discussed.  Natural history risks without surgery was discussed.   I worked to give an overview of the disease and the frequent need to have multispecialty involvement.  I feel the risks of no intervention will lead to serious problems that outweigh the operative risks; therefore, I recommended a partial colectomy to remove the pathology.  Laparoscopic & open techniques were discussed.   Risks such as bleeding, infection, abscess, leak, reoperation, possible ostomy, hernia, heart attack, death, and other risks were discussed.  I noted a good likelihood this will help address the problem.   Goals of post-operative recovery were discussed as well.    The patient expressed understanding & wished to proceed with surgery.  OR FINDINGS:   Patient had mass in his cecum.  No obvious metastatic disease on visceral parietal peritoneum or liver.  DESCRIPTION:   Informed consent was confirmed.  The patient underwent general anaesthesia without difficulty.  The patient was positioned  appropriately.  VTE prevention in place.  The patient's abdomen was clipped, prepped, & draped in a sterile fashion.  Surgical timeout confirmed our plan.  The patient was positioned in reverse Trendelenburg.  Abdominal entry was gained using a Varies needle in the LUQ.  Entry was clean.  I induced carbon dioxide insufflation.  An 33mm robotic port was placed in the LUQ.  Camera inspection revealed no injury.  Extra ports were carefully placed under direct laparoscopic visualization.  I laparoscopically reflected the greater omentum and the upper abdomen the small bowel in the peilvis. The patient was appropriately positioned and the robot was docked to the patient's right side.  Instruments were placed under direct visualization.    I began by identifying the ileocolic artery and vein within the mesentery. Dissection was bluntly carried around these structures. The duodenum was identified and free from the structures. I then separated the structures bluntly and used the robotic vessel sealer device to transect these.  I developed the retroperitoneal plane bluntly.  I then freed the appendix off its attachments to the pelvic wall. I mobilized the terminal ileum.  I took care to avoid injuring any retroperitoneal structures.  After this I began to mobilize laterally down the white line of Toldt and then took down the hepatic flexure using the Enseal device. I mobilized the omentum off of the right transverse colon. The entire colon was then flipped medially and mobilized off of the retroperitoneal structures until I could visualize the lateral edge of the duodenum underneath.  I gently freed  the duodenal attachments.   I identified a portion of mesentery of the transverse colon just proximal to the right branch of the middle colic.  I divided up to the colon from the previous dissection of the mesentery using the robotic vessel sealer.  A pulsatile bleeding vessel was encountered in the transverse colon  mesentery and subsequently controlled with the vessel sealer.  I then divided the terminal ileal mesentery in similar fashion.  At that point, the terminal ileum was divided with a blue load robotic 60 mm stapler.  The transverse colon was divided with a green load robotic stapler x2.  The specimen was then completely free and placed in the right upper quadrant.  Hemostasis was good.  I then oriented the remaining terminal ileum and transverse colon and a isoperistaltic fashion.  I placed an enterotomy in the small bowel and colon using the robotic scissors.  I then introduced a white load 60 mm robotic stapler into both enterotomies and created an anastomosis between the small bowel and transverse colon.  Hemostasis within the staple line was good.  The common enterotomy channel was closed using 2 running 2-0 V-Loc sutures.  The abdomen was then irrigated with normal saline. The omentum was then brought down over the anastomosis.  At this point the robot was undocked.  The 12 mm suprapubic port was enlarged to a Pfannenstiel incision and an [place] wound protector was placed.  The specimen was removed from the abdomen and evaluated.  Once the abdomen was inspected for hemostasis, the Clyde wound protector was removed.    The peritoneum of the Pfannenstiel incision was closed using a running 0 Vicryl suture.  The fascia was then closed using #1 Novafil interrupted sutures.  The subcutaneous tissue of the extraction incision was closed using a running 2-0 Vicryl suture. The skin was then closed using running subcuticular 4-0 Vicryl sutures.  A sterile dressing was applied.  The remaining port sites were closed using interrupted 4-0 Vicryl sutures and Dermabond. All counts were correct per operating room staff. The patient was then awakened from anesthesia and sent to the post anesthesia care unit in stable condition.   Rosario Adie, MD  [facility]

## 2022-03-07 LAB — BASIC METABOLIC PANEL
Anion gap: 9 (ref 5–15)
BUN: 21 mg/dL (ref 8–23)
CO2: 20 mmol/L — ABNORMAL LOW (ref 22–32)
Calcium: 8.4 mg/dL — ABNORMAL LOW (ref 8.9–10.3)
Chloride: 106 mmol/L (ref 98–111)
Creatinine, Ser: 1.44 mg/dL — ABNORMAL HIGH (ref 0.61–1.24)
GFR, Estimated: 51 mL/min — ABNORMAL LOW (ref >60)
Glucose, Bld: 162 mg/dL — ABNORMAL HIGH (ref 70–99)
Potassium: 4.8 mmol/L (ref 3.5–5.1)
Sodium: 135 mmol/L (ref 135–145)

## 2022-03-07 LAB — CBC
HCT: 36.3 % — ABNORMAL LOW (ref 39.0–52.0)
Hemoglobin: 12.1 g/dL — ABNORMAL LOW (ref 13.0–17.0)
MCH: 31.1 pg (ref 26.0–34.0)
MCHC: 33.3 g/dL (ref 30.0–36.0)
MCV: 93.3 fL (ref 80.0–100.0)
Platelets: 254 10*3/uL (ref 150–400)
RBC: 3.89 MIL/uL — ABNORMAL LOW (ref 4.22–5.81)
RDW: 13.7 % (ref 11.5–15.5)
WBC: 15.8 10*3/uL — ABNORMAL HIGH (ref 4.0–10.5)
nRBC: 0 % (ref 0.0–0.2)

## 2022-03-07 MED ORDER — TRAMADOL HCL 50 MG PO TABS
50.0000 mg | ORAL_TABLET | Freq: Four times a day (QID) | ORAL | Status: DC | PRN
  Administered 2022-03-07: 50 mg via ORAL
  Filled 2022-03-07 (×2): qty 1

## 2022-03-08 ENCOUNTER — Other Ambulatory Visit (HOSPITAL_COMMUNITY): Payer: Self-pay

## 2022-03-08 LAB — BASIC METABOLIC PANEL
Anion gap: 6 (ref 5–15)
BUN: 20 mg/dL (ref 8–23)
CO2: 26 mmol/L (ref 22–32)
Calcium: 8.7 mg/dL — ABNORMAL LOW (ref 8.9–10.3)
Chloride: 105 mmol/L (ref 98–111)
Creatinine, Ser: 1.35 mg/dL — ABNORMAL HIGH (ref 0.61–1.24)
GFR, Estimated: 55 mL/min — ABNORMAL LOW (ref >60)
Glucose, Bld: 130 mg/dL — ABNORMAL HIGH (ref 70–99)
Potassium: 4.5 mmol/L (ref 3.5–5.1)
Sodium: 137 mmol/L (ref 135–145)

## 2022-03-08 LAB — CBC
HCT: 33.8 % — ABNORMAL LOW (ref 39.0–52.0)
Hemoglobin: 11.1 g/dL — ABNORMAL LOW (ref 13.0–17.0)
MCH: 31 pg (ref 26.0–34.0)
MCHC: 32.8 g/dL (ref 30.0–36.0)
MCV: 94.4 fL (ref 80.0–100.0)
Platelets: 230 10*3/uL (ref 150–400)
RBC: 3.58 MIL/uL — ABNORMAL LOW (ref 4.22–5.81)
RDW: 14 % (ref 11.5–15.5)
WBC: 14.1 10*3/uL — ABNORMAL HIGH (ref 4.0–10.5)
nRBC: 0 % (ref 0.0–0.2)

## 2022-03-08 MED ORDER — TRAMADOL HCL 50 MG PO TABS
50.0000 mg | ORAL_TABLET | Freq: Four times a day (QID) | ORAL | 0 refills | Status: DC | PRN
  Filled 2022-03-08: qty 25, 7d supply, fill #0

## 2022-03-08 NOTE — Progress Notes (Signed)
Reviewed written d/c instructions w pt and his wife and all questions answered. They both verbalized understanding. D/C via w/c w all belongings in stable condition. 

## 2022-03-12 LAB — SURGICAL PATHOLOGY

## 2022-03-19 ENCOUNTER — Other Ambulatory Visit: Payer: Self-pay

## 2022-03-19 NOTE — Progress Notes (Signed)
The proposed treatment discussed in conference is for discussion purpose only and is not a binding recommendation.  The patients have not been physically examined, or presented with their treatment options.  Therefore, final treatment plans cannot be decided.  

## 2022-03-25 ENCOUNTER — Telehealth: Payer: Self-pay | Admitting: Nurse Practitioner

## 2022-03-25 NOTE — Telephone Encounter (Signed)
Scheduled appt per 7/11 referral. Pt is aware of appt date and time. Pt is aware to arrive 15 mins prior to appt time and to bring and updated insurance card. Pt is aware of appt location.   
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
## 2022-04-14 ENCOUNTER — Inpatient Hospital Stay: Payer: Medicare HMO

## 2022-04-14 ENCOUNTER — Encounter: Payer: Self-pay | Admitting: Nurse Practitioner

## 2022-04-14 ENCOUNTER — Inpatient Hospital Stay: Payer: Medicare HMO | Attending: Nurse Practitioner | Admitting: Nurse Practitioner

## 2022-04-14 ENCOUNTER — Other Ambulatory Visit: Payer: Self-pay

## 2022-04-14 VITALS — BP 136/71 | HR 85 | Temp 97.9°F | Resp 18 | Ht 68.0 in | Wt 181.5 lb

## 2022-04-14 DIAGNOSIS — Z85118 Personal history of other malignant neoplasm of bronchus and lung: Secondary | ICD-10-CM

## 2022-04-14 DIAGNOSIS — H409 Unspecified glaucoma: Secondary | ICD-10-CM

## 2022-04-14 DIAGNOSIS — C180 Malignant neoplasm of cecum: Secondary | ICD-10-CM

## 2022-04-14 DIAGNOSIS — K2100 Gastro-esophageal reflux disease with esophagitis, without bleeding: Secondary | ICD-10-CM | POA: Diagnosis not present

## 2022-04-14 DIAGNOSIS — Z9049 Acquired absence of other specified parts of digestive tract: Secondary | ICD-10-CM

## 2022-04-14 DIAGNOSIS — Z8546 Personal history of malignant neoplasm of prostate: Secondary | ICD-10-CM | POA: Diagnosis not present

## 2022-04-14 DIAGNOSIS — I10 Essential (primary) hypertension: Secondary | ICD-10-CM

## 2022-04-14 LAB — IRON AND IRON BINDING CAPACITY (CC-WL,HP ONLY)
Iron: 82 ug/dL (ref 45–182)
Saturation Ratios: 29 % (ref 17.9–39.5)
TIBC: 284 ug/dL (ref 250–450)
UIBC: 202 ug/dL (ref 117–376)

## 2022-04-14 LAB — FERRITIN: Ferritin: 61 ng/mL (ref 24–336)

## 2022-04-14 LAB — CBC WITH DIFFERENTIAL (CANCER CENTER ONLY)
Abs Immature Granulocytes: 0.02 10*3/uL (ref 0.00–0.07)
Basophils Absolute: 0.1 10*3/uL (ref 0.0–0.1)
Basophils Relative: 1 %
Eosinophils Absolute: 0.2 10*3/uL (ref 0.0–0.5)
Eosinophils Relative: 2 %
HCT: 32.5 % — ABNORMAL LOW (ref 39.0–52.0)
Hemoglobin: 11.2 g/dL — ABNORMAL LOW (ref 13.0–17.0)
Immature Granulocytes: 0 %
Lymphocytes Relative: 36 %
Lymphs Abs: 2.4 10*3/uL (ref 0.7–4.0)
MCH: 31.1 pg (ref 26.0–34.0)
MCHC: 34.5 g/dL (ref 30.0–36.0)
MCV: 90.3 fL (ref 80.0–100.0)
Monocytes Absolute: 0.6 10*3/uL (ref 0.1–1.0)
Monocytes Relative: 9 %
Neutro Abs: 3.5 10*3/uL (ref 1.7–7.7)
Neutrophils Relative %: 52 %
Platelet Count: 243 10*3/uL (ref 150–400)
RBC: 3.6 MIL/uL — ABNORMAL LOW (ref 4.22–5.81)
RDW: 13.6 % (ref 11.5–15.5)
WBC Count: 6.8 10*3/uL (ref 4.0–10.5)
nRBC: 0 % (ref 0.0–0.2)

## 2022-04-14 LAB — CMP (CANCER CENTER ONLY)
ALT: 17 U/L (ref 0–44)
AST: 14 U/L — ABNORMAL LOW (ref 15–41)
Albumin: 4.3 g/dL (ref 3.5–5.0)
Alkaline Phosphatase: 65 U/L (ref 38–126)
Anion gap: 7 (ref 5–15)
BUN: 24 mg/dL — ABNORMAL HIGH (ref 8–23)
CO2: 25 mmol/L (ref 22–32)
Calcium: 8.7 mg/dL — ABNORMAL LOW (ref 8.9–10.3)
Chloride: 108 mmol/L (ref 98–111)
Creatinine: 1.29 mg/dL — ABNORMAL HIGH (ref 0.61–1.24)
GFR, Estimated: 58 mL/min — ABNORMAL LOW (ref >60)
Glucose, Bld: 99 mg/dL (ref 70–99)
Potassium: 4 mmol/L (ref 3.5–5.1)
Sodium: 140 mmol/L (ref 135–145)
Total Bilirubin: 0.6 mg/dL (ref 0.3–1.2)
Total Protein: 7.4 g/dL (ref 6.5–8.1)

## 2022-04-14 LAB — CEA (IN HOUSE-CHCC): CEA (CHCC-In House): 2.64 ng/mL (ref 0.00–5.00)

## 2022-04-14 NOTE — Progress Notes (Signed)
I met with [name] Short and his wife after his consultation with Cira Rue, NP and Dr Burr Medico.  I explained my role as a nurse navigator and provided my contact information. I explained the services provided at [facility] and provided written information. All questions were answered.  e verbalized understanding.

## 2022-04-16 ENCOUNTER — Telehealth: Payer: Self-pay | Admitting: Nurse Practitioner

## 2022-04-16 NOTE — Telephone Encounter (Signed)
Scheduled follow-up appointments per 7/31 los. Patient is aware.

## 2022-04-24 ENCOUNTER — Other Ambulatory Visit: Payer: Self-pay

## 2022-04-29 ENCOUNTER — Other Ambulatory Visit: Payer: Self-pay | Admitting: Nurse Practitioner

## 2022-04-29 DIAGNOSIS — C180 Malignant neoplasm of cecum: Secondary | ICD-10-CM

## 2022-04-29 LAB — GUARDANT 360

## 2022-05-21 ENCOUNTER — Other Ambulatory Visit: Payer: Self-pay

## 2022-05-22 ENCOUNTER — Other Ambulatory Visit: Payer: Self-pay

## 2022-05-22 ENCOUNTER — Inpatient Hospital Stay: Payer: Medicare HMO | Attending: Nurse Practitioner

## 2022-05-22 DIAGNOSIS — C180 Malignant neoplasm of cecum: Secondary | ICD-10-CM | POA: Diagnosis present

## 2022-05-22 DIAGNOSIS — C182 Malignant neoplasm of ascending colon: Secondary | ICD-10-CM | POA: Diagnosis not present

## 2022-05-22 LAB — CMP (CANCER CENTER ONLY)
ALT: 12 U/L (ref 0–44)
AST: 12 U/L — ABNORMAL LOW (ref 15–41)
Albumin: 4.2 g/dL (ref 3.5–5.0)
Alkaline Phosphatase: 70 U/L (ref 38–126)
Anion gap: 6 (ref 5–15)
BUN: 33 mg/dL — ABNORMAL HIGH (ref 8–23)
CO2: 24 mmol/L (ref 22–32)
Calcium: 9.2 mg/dL (ref 8.9–10.3)
Chloride: 107 mmol/L (ref 98–111)
Creatinine: 1.33 mg/dL — ABNORMAL HIGH (ref 0.61–1.24)
GFR, Estimated: 56 mL/min — ABNORMAL LOW (ref >60)
Glucose, Bld: 101 mg/dL — ABNORMAL HIGH (ref 70–99)
Potassium: 4.3 mmol/L (ref 3.5–5.1)
Sodium: 137 mmol/L (ref 135–145)
Total Bilirubin: 0.5 mg/dL (ref 0.3–1.2)
Total Protein: 7.3 g/dL (ref 6.5–8.1)

## 2022-05-22 LAB — CBC WITH DIFFERENTIAL (CANCER CENTER ONLY)
Abs Immature Granulocytes: 0.02 10*3/uL (ref 0.00–0.07)
Basophils Absolute: 0.1 10*3/uL (ref 0.0–0.1)
Basophils Relative: 1 %
Eosinophils Absolute: 0.2 10*3/uL (ref 0.0–0.5)
Eosinophils Relative: 3 %
HCT: 35.6 % — ABNORMAL LOW (ref 39.0–52.0)
Hemoglobin: 12.1 g/dL — ABNORMAL LOW (ref 13.0–17.0)
Immature Granulocytes: 0 %
Lymphocytes Relative: 33 %
Lymphs Abs: 2.2 10*3/uL (ref 0.7–4.0)
MCH: 30.6 pg (ref 26.0–34.0)
MCHC: 34 g/dL (ref 30.0–36.0)
MCV: 89.9 fL (ref 80.0–100.0)
Monocytes Absolute: 0.7 10*3/uL (ref 0.1–1.0)
Monocytes Relative: 11 %
Neutro Abs: 3.4 10*3/uL (ref 1.7–7.7)
Neutrophils Relative %: 52 %
Platelet Count: 298 10*3/uL (ref 150–400)
RBC: 3.96 MIL/uL — ABNORMAL LOW (ref 4.22–5.81)
RDW: 13.5 % (ref 11.5–15.5)
WBC Count: 6.6 10*3/uL (ref 4.0–10.5)
nRBC: 0 % (ref 0.0–0.2)

## 2022-06-04 ENCOUNTER — Encounter: Payer: Self-pay | Admitting: Nurse Practitioner

## 2022-06-05 LAB — GUARDANT 360

## 2022-06-25 ENCOUNTER — Other Ambulatory Visit: Payer: Self-pay

## 2022-06-26 ENCOUNTER — Inpatient Hospital Stay: Payer: Medicare HMO | Attending: Nurse Practitioner | Admitting: Hematology

## 2022-06-26 ENCOUNTER — Inpatient Hospital Stay: Payer: Medicare HMO

## 2022-06-26 ENCOUNTER — Other Ambulatory Visit: Payer: Self-pay

## 2022-06-26 ENCOUNTER — Encounter: Payer: Self-pay | Admitting: Hematology

## 2022-06-26 VITALS — BP 129/73 | HR 54 | Temp 98.1°F | Resp 18 | Ht 68.0 in | Wt 183.7 lb

## 2022-06-26 DIAGNOSIS — C3492 Malignant neoplasm of unspecified part of left bronchus or lung: Secondary | ICD-10-CM

## 2022-06-26 DIAGNOSIS — Z87891 Personal history of nicotine dependence: Secondary | ICD-10-CM | POA: Insufficient documentation

## 2022-06-26 DIAGNOSIS — Z85828 Personal history of other malignant neoplasm of skin: Secondary | ICD-10-CM | POA: Insufficient documentation

## 2022-06-26 DIAGNOSIS — Z79899 Other long term (current) drug therapy: Secondary | ICD-10-CM | POA: Diagnosis not present

## 2022-06-26 DIAGNOSIS — C180 Malignant neoplasm of cecum: Secondary | ICD-10-CM

## 2022-06-26 DIAGNOSIS — C182 Malignant neoplasm of ascending colon: Secondary | ICD-10-CM | POA: Insufficient documentation

## 2022-06-26 DIAGNOSIS — Z85118 Personal history of other malignant neoplasm of bronchus and lung: Secondary | ICD-10-CM | POA: Insufficient documentation

## 2022-06-26 DIAGNOSIS — Z8546 Personal history of malignant neoplasm of prostate: Secondary | ICD-10-CM | POA: Diagnosis not present

## 2022-06-26 DIAGNOSIS — Z902 Acquired absence of lung [part of]: Secondary | ICD-10-CM | POA: Insufficient documentation

## 2022-06-26 LAB — CMP (CANCER CENTER ONLY)
ALT: 13 U/L (ref 0–44)
AST: 13 U/L — ABNORMAL LOW (ref 15–41)
Albumin: 4.4 g/dL (ref 3.5–5.0)
Alkaline Phosphatase: 73 U/L (ref 38–126)
Anion gap: 5 (ref 5–15)
BUN: 31 mg/dL — ABNORMAL HIGH (ref 8–23)
CO2: 26 mmol/L (ref 22–32)
Calcium: 9.3 mg/dL (ref 8.9–10.3)
Chloride: 106 mmol/L (ref 98–111)
Creatinine: 1.38 mg/dL — ABNORMAL HIGH (ref 0.61–1.24)
GFR, Estimated: 53 mL/min — ABNORMAL LOW (ref >60)
Glucose, Bld: 119 mg/dL — ABNORMAL HIGH (ref 70–99)
Potassium: 5.1 mmol/L (ref 3.5–5.1)
Sodium: 137 mmol/L (ref 135–145)
Total Bilirubin: 0.5 mg/dL (ref 0.3–1.2)
Total Protein: 7.8 g/dL (ref 6.5–8.1)

## 2022-06-26 LAB — CEA (IN HOUSE-CHCC): CEA (CHCC-In House): 1.75 ng/mL (ref 0.00–5.00)

## 2022-06-26 LAB — CBC WITH DIFFERENTIAL (CANCER CENTER ONLY)
Abs Immature Granulocytes: 0.02 10*3/uL (ref 0.00–0.07)
Basophils Absolute: 0.1 10*3/uL (ref 0.0–0.1)
Basophils Relative: 1 %
Eosinophils Absolute: 0.3 10*3/uL (ref 0.0–0.5)
Eosinophils Relative: 4 %
HCT: 36.1 % — ABNORMAL LOW (ref 39.0–52.0)
Hemoglobin: 12.4 g/dL — ABNORMAL LOW (ref 13.0–17.0)
Immature Granulocytes: 0 %
Lymphocytes Relative: 29 %
Lymphs Abs: 2 10*3/uL (ref 0.7–4.0)
MCH: 31 pg (ref 26.0–34.0)
MCHC: 34.3 g/dL (ref 30.0–36.0)
MCV: 90.3 fL (ref 80.0–100.0)
Monocytes Absolute: 0.7 10*3/uL (ref 0.1–1.0)
Monocytes Relative: 10 %
Neutro Abs: 3.9 10*3/uL (ref 1.7–7.7)
Neutrophils Relative %: 56 %
Platelet Count: 297 10*3/uL (ref 150–400)
RBC: 4 MIL/uL — ABNORMAL LOW (ref 4.22–5.81)
RDW: 13.8 % (ref 11.5–15.5)
WBC Count: 6.9 10*3/uL (ref 4.0–10.5)
nRBC: 0 % (ref 0.0–0.2)

## 2022-06-26 NOTE — Progress Notes (Signed)
[place]   Telephone:([phone] Fax:(336) [phone]   Clinic Follow up Note   Patient Care Team: Asencion Noble, MD as PCP - General (Internal Medicine) Rogene Houston, MD as Consulting Physician (Gastroenterology) Leighton Ruff, MD as Consulting Physician (General Surgery) Truitt Merle, MD as Consulting Physician (Hematology) Alla Feeling, NP as Nurse Practitioner (Nurse Practitioner)  Date of Service:  06/26/2022  CHIEF COMPLAINT: f/u of colon cancer  CURRENT THERAPY:  Surveillance  ASSESSMENT & PLAN:  [name] Short is a 76 y.o. male with   1. Moderately differentiated adenocarcinoma of the cecum/ascending colon, p(T3, N0) cM0 stage IIa -found to have a 3 cm cecal mass on routine colonoscopy on 12/19/21 by Dr. Laural Golden. Biopsy revealed moderately differentiated adenocarcinoma -Staging CT AP 12/30/21 and chest 01/30/22 were both negative for metastatic disease. -baseline CEA 02/27/22 elevated to 44 -s/p right hemicolectomy by Dr. Marcello Moores 03/06/22, path 3.5 cm mass invading into pericolonic soft tissue. Margins and lymph nodes negative. MMR normal, MSI stable -postoperative CEA dropped to WNL. -Guardant Reveal from 05/22/22 was negative. -he is clinically doing very well, recovered well from surgery. Physical exam was unremarkable aside from probable postoperative hernia. Labs reviewed, overall stable. There is no clinical concern for recurrence. -f/u in 4 months  2. H/o LUL lung cancer, Prostate cancer, Skin cancer of right eye -S/p left upper lobectomy with LND by Dr. Servando Snare 03/20/10, squamous cell, stage Ia, p(T1b, N0). No adjuvant treatment. -S/p prostatectomy 09/13/10, Gleason 3+3, stage pT2c, cN0. No adjuvant treatment     PLAN: -lab and f/u in 4 months   No problem-specific Assessment & Plan notes found for this encounter.   SUMMARY OF ONCOLOGIC HISTORY: Oncology History  Colon cancer ([place])  12/19/2021 Procedure   Colonoscopy impression, by Dr. Laural Golden -  Likely malignant tumor in the cecum. Biopsied. - One small polyp in the distal transverse colon. Biopsied. - Five small polyps in the sigmoid colon and in the descending colon. - One 5 to 10 mm polyp at the splenic flexure, removed with a cold snare. Resected and retrieved. - One 7 mm polyp in the mid sigmoid colon, removed with a cold snare. Resected and retrieved. - Diverticulosis in the sigmoid colon. - External hemorrhoids.   12/19/2021 Initial Biopsy   FINAL MICROSCOPIC DIAGNOSIS:  A. COLON, MASS, BIOPSY:  Invasive moderately differentiated adenocarcinoma  Tumor arises within ulcerated tubular adenoma with high-grade dysplasia  B. COLON, TRANSVERSE, DESCENDING, SIGMOID, POLYPECTOMY:  Tubular adenoma  Negative for high-grade dysplasia and carcinoma  C.  COLON, SPLENIC FLEXURE POLYP:  Tubular adenoma  Negative for high-grade dysplasia and carcinoma  D. COLON, DISTAL SIGMOID, POLYPECTOMY:  Tubular adenoma  Negative for high-grade dysplasia and carcinoma    12/30/2021 Imaging   CT AP IMPRESSION: No acute findings. No evidence of abdominal or pelvic metastatic disease.  Prior prostatectomy. Aortic Atherosclerosis (ICD10-I70.0).   01/30/2022 Imaging   CT chest IMPRESSION: 1. No evidence of pulmonary metastasis. 2. Coronary artery calcification and Aortic Atherosclerosis (ICD10-I70.0).     02/27/2022 Tumor Marker   CEA: 44.1   03/06/2022 Initial Diagnosis   Colon cancer ([place])   03/06/2022 Cancer Staging   Staging form: Colon and Rectum, AJCC 8th Edition - Pathologic stage from 03/06/2022: Stage IIA (pT3, pN0, cM0) - Signed by Alla Feeling, NP on 04/14/2022 Stage prefix: Initial diagnosis Total positive nodes: 0 Histologic grading system: 4 grade system Histologic grade (G): G2   03/06/2022 Surgery   PROCEDURE:  XI ROBOT ASSISTED PARTIAL  RIGHT COLECTOMY   03/06/2022 Pathology Results   FINAL MICROSCOPIC DIAGNOSIS:  A. COLON, RIGHT, RESECTION:  - Invasive moderately  differentiated adenocarcinoma, 3.5 cm, involving  cecum and proximal ascending colon  - Carcinoma invades into pericolonic soft tissue  - Resection margins are negative for carcinoma  - Seventeen benign lymph nodes (0/17)  - Two separate tubular adenomas  - Unremarkable appendiceal stump  Procedure: Resection, right colon  Tumor Site: Cecum and proximal ascending colon  Tumor Size: 3.5 cm  Macroscopic Tumor Perforation: Not identified  Histologic Type: Adenocarcinoma  Histologic Grade: G2: Moderately differentiated  Multiple Primary Sites: Not applicable  Tumor Extension: Carcinoma invades into pericolonic soft tissue  Lymphovascular Invasion: Not identified  Perineural Invasion: Not identified  Treatment Effect: No known presurgical therapy  Margins:       Margin Status for Invasive Carcinoma: All margins negative for invasive carcinoma       Margin Status for Non-Invasive Tumor: All margins negative for high-grade dysplasia / intramucosal carcinoma and low-grade dysplasia  Regional Lymph Nodes:       Number of Lymph Nodes with Tumor: 0       Number of Lymph Nodes Examined: 17  Tumor Deposits: Not identified  Distant Metastasis:       Distant Site(s) Involved: Not applicable  Pathologic Stage Classification (pTNM, AJCC 8th Edition): pT3, pN0   MMR normal, MSI-stable      INTERVAL HISTORY:  [name] Short is here for a follow up of colon cancer. He was last seen by me with NP Lacie on 04/14/22 in consultation. He presents to the clinic alone. He reports he is doing well overall and has recovered from surgery.   All other systems were reviewed with the patient and are negative.  MEDICAL HISTORY:  Past Medical History:  Diagnosis Date   Arthritis    ESP IN HANDS   Cancer ([place])    OCCULAR RIGHT EYE--SURGERY AND CHEMO  PT IS LEGALLY BLIND IN RT EYE   Cataract    LEFT EYE   Colon cancer ([place])    COPD (chronic obstructive pulmonary disease) ([place])    FORMER SMOKER    Erectile dysfunction    GERD (gastroesophageal reflux disease)    Glaucoma    ONLY IN RIGHT EYE   Hypertension    Lung cancer ([place])    lung ca dx 02/2010   Paresthesia and pain of right extremity    RIGHT SHOULDER    Pneumonia 2011   Prostate cancer ([place])    prostate ca dx 11/11   Shortness of breath    ONLY WITH EXERTION; HX OF LOBECTOMY FOR CANER   Sleep apnea     SURGICAL HISTORY: Past Surgical History:  Procedure Laterality Date   APPENDECTOMY  02/1999   BACK SURGERY     BIOPSY  12/19/2021   Procedure: BIOPSY;  Surgeon: Rogene Houston, MD;  [facility];  Service: Endoscopy;;   CARDIAC CATHETERIZATION  2008   indication was due to LOC, chest tightness, diaphoreses; seen at [place] [facility]; PER PATIENT , NORMAL ; no records in epic ; endoreses repeat intermittent sx since time of cath    COLONOSCOPY  08/06/2012   Procedure: COLONOSCOPY;  Surgeon: Rogene Houston, MD;  [facility];  Service: Endoscopy;  Laterality: N/A;  955   COLONOSCOPY N/A 08/08/2015   Procedure: COLONOSCOPY;  Surgeon: Rogene Houston, MD;  [facility];  Service: Endoscopy;  Laterality: N/A;  1200  COLONOSCOPY N/A 10/21/2018   Procedure: COLONOSCOPY;  Surgeon: Rogene Houston, MD;  [facility];  Service: Endoscopy;  Laterality: N/A;  1030   COLONOSCOPY WITH PROPOFOL N/A 12/19/2021   Procedure: COLONOSCOPY WITH PROPOFOL;  Surgeon: Rogene Houston, MD;  [facility];  Service: Endoscopy;  Laterality: N/A;  [place] -RIGHT; MAY AND JUNE 2009 RIGHT EYE SURGERY FOR CANCER-LENS HAS BEEN REMOVED.   LEFT UPPER LOBECTOMY  03/2010   FOR CANCER   PENILE PROSTHESIS IMPLANT  08/10/2012   Procedure: PENILE PROTHESIS INFLATABLE;  Surgeon: Malka So, MD;  [facility];  Service: Urology;  Laterality: N/A;  IMPLANT 3 PIECE PENILE PROSTHESIS    POLYPECTOMY  10/21/2018   Procedure: POLYPECTOMY;   Surgeon: Rogene Houston, MD;  [facility];  Service: Endoscopy;;  colon   POLYPECTOMY  12/19/2021   Procedure: POLYPECTOMY;  Surgeon: Rogene Houston, MD;  [facility];  Service: Endoscopy;;   PROSTATECTOMY  08/2010   FOR CANCER   TOTAL KNEE ARTHROPLASTY Left 09/10/2017   Procedure: LEFT TOTAL KNEE ARTHROPLASTY;  Surgeon: Susa Day, MD;  [facility];  Service: Orthopedics;  Laterality: Left;  120 mins    I have reviewed the social history and family history with the patient and they are unchanged from previous note.  ALLERGIES:  is allergic to silicone.  MEDICATIONS:  Current Outpatient Medications  Medication Sig Dispense Refill   acetaminophen (TYLENOL) 650 MG CR tablet Take 1,300 mg by mouth every morning.     amLODipine (NORVASC) 5 MG tablet Take 5 mg by mouth daily.     aspirin EC 81 MG tablet Take 1 tablet (81 mg total) by mouth daily. 30 tablet 11   atorvastatin (LIPITOR) 20 MG tablet Take 20 mg by mouth daily.     chlorthalidone (HYGROTON) 25 MG tablet Take 25 mg by mouth daily.      Cholecalciferol (VITAMIN D) 50 MCG (2000 UT) tablet Take 2,000 Units by mouth daily.     losartan (COZAAR) 100 MG tablet Take 100 mg by mouth daily.     meloxicam (MOBIC) 15 MG tablet Take 1 tablet (15 mg total) by mouth daily. 30 tablet 3   omeprazole (PRILOSEC) 20 MG capsule Take 20 mg by mouth every other day. OTC     triamcinolone cream (KENALOG) 0.1 % Apply 1 application. topically daily as needed for rash.     No current facility-administered medications for this visit.    PHYSICAL EXAMINATION: ECOG PERFORMANCE STATUS: 0 - Asymptomatic  Vitals:   06/26/22 1055  BP: 129/73  Pulse: (!) 54  Resp: 18  Temp: 98.1 F (36.7 C)  SpO2: 100%   Wt Readings from Last 3 Encounters:  06/26/22 183 lb 11.2 oz (83.3 kg)  04/14/22 181 lb 8 oz (82.3 kg)  03/08/22 179 lb 7.3 oz (81.4 kg)     GENERAL:alert, no distress and comfortable SKIN: skin color, texture,  turgor are normal, no rashes or significant lesions EYES: normal, Conjunctiva are pink and non-injected, sclera clear  NECK: supple, thyroid normal size, non-tender, without nodularity LYMPH:  no palpable lymphadenopathy in the cervical, axillary  LUNGS: clear to auscultation and percussion with normal breathing effort HEART: regular rate & rhythm and no murmurs and no lower extremity edema ABDOMEN:abdomen soft, non-tender and normal bowel sounds Musculoskeletal:no cyanosis of digits and no clubbing  NEURO: alert & oriented x 3 with fluent speech, no focal motor/sensory deficits  LABORATORY DATA:  I have reviewed the data as listed    Latest Ref Rng & Units 06/26/2022   10:19 AM 05/22/2022   11:23 AM 04/14/2022   12:43 PM  CBC  WBC 4.0 - 10.5 K/uL 6.9  6.6  6.8   Hemoglobin 13.0 - 17.0 g/dL 12.4  12.1  11.2   Hematocrit 39.0 - 52.0 % 36.1  35.6  32.5   Platelets 150 - 400 K/uL 297  298  243         Latest Ref Rng & Units 06/26/2022   10:19 AM 05/22/2022   11:23 AM 04/14/2022   12:43 PM  CMP  Glucose 70 - 99 mg/dL 119  101  99   BUN 8 - 23 mg/dL 31  33  24   Creatinine 0.61 - 1.24 mg/dL 1.38  1.33  1.29   Sodium 135 - 145 mmol/L 137  137  140   Potassium 3.5 - 5.1 mmol/L 5.1  4.3  4.0   Chloride 98 - 111 mmol/L 106  107  108   CO2 22 - 32 mmol/L '26  24  25   ' Calcium 8.9 - 10.3 mg/dL 9.3  9.2  8.7   Total Protein 6.5 - 8.1 g/dL 7.8  7.3  7.4   Total Bilirubin 0.3 - 1.2 mg/dL 0.5  0.5  0.6   Alkaline Phos 38 - 126 U/L 73  70  65   AST 15 - 41 U/L '13  12  14   ' ALT 0 - 44 U/L '13  12  17       ' RADIOGRAPHIC STUDIES: I have personally reviewed the radiological images as listed and agreed with the findings in the report. No results found.    No orders of the defined types were placed in this encounter.  All questions were answered. The patient knows to call the clinic with any problems, questions or concerns. No barriers to learning was detected. The total time spent in the  appointment was 25 minutes.     Truitt Merle, MD 06/26/2022   I, Wilburn Mylar, am acting as scribe for Truitt Merle, MD.   I have reviewed the above documentation for accuracy and completeness, and I agree with the above.

## 2022-06-27 ENCOUNTER — Other Ambulatory Visit: Payer: Self-pay

## 2022-06-27 ENCOUNTER — Inpatient Hospital Stay: Payer: Medicare HMO

## 2022-06-27 DIAGNOSIS — C180 Malignant neoplasm of cecum: Secondary | ICD-10-CM

## 2022-07-07 ENCOUNTER — Telehealth: Payer: Self-pay

## 2022-07-07 NOTE — Telephone Encounter (Signed)
Called pt to remind him that he has a Guardant Reveal that is due.  Pt stated he had his Guardant Reveal drawn on 06/27/2022.  Pt stated he had came in for labs and f/u visit but the lab realized they didn't draw his [place] so they called him back in the next day just to have his labs drawn.  Canceled pt's lab appt since the [place] was drawn.

## 2022-07-10 ENCOUNTER — Other Ambulatory Visit: Payer: Medicare HMO

## 2022-08-14 ENCOUNTER — Inpatient Hospital Stay: Payer: Medicare HMO | Attending: Nurse Practitioner

## 2022-08-14 ENCOUNTER — Other Ambulatory Visit: Payer: Self-pay

## 2022-08-14 ENCOUNTER — Telehealth: Payer: Self-pay

## 2022-08-14 DIAGNOSIS — C3492 Malignant neoplasm of unspecified part of left bronchus or lung: Secondary | ICD-10-CM

## 2022-08-14 DIAGNOSIS — C180 Malignant neoplasm of cecum: Secondary | ICD-10-CM

## 2022-08-14 NOTE — Telephone Encounter (Signed)
Called patient to make him aware of a lab appointment at 3:45 today for a redraw of Guardant 360. He stated he would be here.

## 2022-08-27 ENCOUNTER — Encounter: Payer: Self-pay | Admitting: Hematology

## 2022-08-27 LAB — GUARDANT 360

## 2022-09-16 ENCOUNTER — Telehealth: Payer: Self-pay

## 2022-09-16 NOTE — Telephone Encounter (Signed)
.  Called the pt and verbalized  to him his Guardant Reveal Results which was "NOT DETECTED". The pt had no further questions or concerns. I  was able to give him his upcoming appointments.

## 2022-10-30 ENCOUNTER — Inpatient Hospital Stay: Payer: Medicare HMO | Attending: Nurse Practitioner | Admitting: Hematology

## 2022-10-30 ENCOUNTER — Inpatient Hospital Stay: Payer: Medicare HMO

## 2022-10-30 ENCOUNTER — Encounter: Payer: Self-pay | Admitting: Hematology

## 2022-10-30 VITALS — BP 124/84 | HR 71 | Temp 97.7°F | Resp 18 | Ht 68.0 in | Wt 184.6 lb

## 2022-10-30 DIAGNOSIS — C182 Malignant neoplasm of ascending colon: Secondary | ICD-10-CM

## 2022-10-30 DIAGNOSIS — Z79899 Other long term (current) drug therapy: Secondary | ICD-10-CM | POA: Diagnosis not present

## 2022-10-30 DIAGNOSIS — C180 Malignant neoplasm of cecum: Secondary | ICD-10-CM | POA: Diagnosis not present

## 2022-10-30 DIAGNOSIS — C3492 Malignant neoplasm of unspecified part of left bronchus or lung: Secondary | ICD-10-CM | POA: Diagnosis not present

## 2022-10-30 LAB — CBC WITH DIFFERENTIAL (CANCER CENTER ONLY)
Abs Immature Granulocytes: 0.02 10*3/uL (ref 0.00–0.07)
Basophils Absolute: 0.1 10*3/uL (ref 0.0–0.1)
Basophils Relative: 1 %
Eosinophils Absolute: 0.2 10*3/uL (ref 0.0–0.5)
Eosinophils Relative: 2 %
HCT: 38.8 % — ABNORMAL LOW (ref 39.0–52.0)
Hemoglobin: 13.7 g/dL (ref 13.0–17.0)
Immature Granulocytes: 0 %
Lymphocytes Relative: 29 %
Lymphs Abs: 2.3 10*3/uL (ref 0.7–4.0)
MCH: 31.3 pg (ref 26.0–34.0)
MCHC: 35.3 g/dL (ref 30.0–36.0)
MCV: 88.6 fL (ref 80.0–100.0)
Monocytes Absolute: 0.8 10*3/uL (ref 0.1–1.0)
Monocytes Relative: 10 %
Neutro Abs: 4.5 10*3/uL (ref 1.7–7.7)
Neutrophils Relative %: 58 %
Platelet Count: 298 10*3/uL (ref 150–400)
RBC: 4.38 MIL/uL (ref 4.22–5.81)
RDW: 13.5 % (ref 11.5–15.5)
WBC Count: 7.8 10*3/uL (ref 4.0–10.5)
nRBC: 0 % (ref 0.0–0.2)

## 2022-10-30 LAB — CMP (CANCER CENTER ONLY)
ALT: 15 U/L (ref 0–44)
AST: 15 U/L (ref 15–41)
Albumin: 4.4 g/dL (ref 3.5–5.0)
Alkaline Phosphatase: 72 U/L (ref 38–126)
Anion gap: 10 (ref 5–15)
BUN: 33 mg/dL — ABNORMAL HIGH (ref 8–23)
CO2: 21 mmol/L — ABNORMAL LOW (ref 22–32)
Calcium: 9.3 mg/dL (ref 8.9–10.3)
Chloride: 105 mmol/L (ref 98–111)
Creatinine: 1.3 mg/dL — ABNORMAL HIGH (ref 0.61–1.24)
GFR, Estimated: 57 mL/min — ABNORMAL LOW (ref >60)
Glucose, Bld: 111 mg/dL — ABNORMAL HIGH (ref 70–99)
Potassium: 4.1 mmol/L (ref 3.5–5.1)
Sodium: 136 mmol/L (ref 135–145)
Total Bilirubin: 0.7 mg/dL (ref 0.3–1.2)
Total Protein: 7.6 g/dL (ref 6.5–8.1)

## 2022-10-30 LAB — CEA (IN HOUSE-CHCC): CEA (CHCC-In House): 2.19 ng/mL (ref 0.00–5.00)

## 2022-10-30 NOTE — Progress Notes (Signed)
[place]   Telephone:(336) [phone] Fax:(336) [phone]   Clinic Follow up Note   Patient Care Team: Asencion Noble, MD as PCP - General (Internal Medicine) Rogene Houston, MD as Consulting Physician (Gastroenterology) Leighton Ruff, MD as Consulting Physician (General Surgery) Truitt Merle, MD as Consulting Physician (Hematology) Alla Feeling, NP as Nurse Practitioner (Nurse Practitioner)  Date of Service:  10/30/2022  CHIEF COMPLAINT: f/u of  colon cancer   CURRENT THERAPY:  Surveillance   ASSESSMENT:  [name] Short is a 77 y.o. male with   Colon cancer ([place]) p(T3, N0) cM0 stage IIa -found to have a 3 cm cecal mass on routine colonoscopy on 12/19/21 by Dr. Laural Golden. Biopsy revealed moderately differentiated adenocarcinoma -Staging CT AP 12/30/21 and chest 01/30/22 were both negative for metastatic disease. -baseline CEA 02/27/22 elevated to 44 -s/p right hemicolectomy by Dr. Marcello Moores 03/06/22, path 3.5 cm mass invading into pericolonic soft tissue. Margins and lymph nodes negative. MMR normal, MSI stable -postoperative CEA dropped to WNL. -Guardant Reveal from 05/22/22 was negative. -He is clinically doing well, no concern for recurrence.  Plan to repeat surveillance CT scan in June 2024    PLAN: -lab reviewed  - Order CT Scan in 4 months -Colonoscopy 03/2023 -f/u in 4 months  SUMMARY OF ONCOLOGIC HISTORY: Oncology History  Colon cancer ([place])  12/19/2021 Procedure   Colonoscopy impression, by Dr. Laural Golden - Likely malignant tumor in the cecum. Biopsied. - One small polyp in the distal transverse colon. Biopsied. - Five small polyps in the sigmoid colon and in the descending colon. - One 5 to 10 mm polyp at the splenic flexure, removed with a cold snare. Resected and retrieved. - One 7 mm polyp in the mid sigmoid colon, removed with a cold snare. Resected and retrieved. - Diverticulosis in the sigmoid colon. - External hemorrhoids.   12/19/2021 Initial Biopsy    FINAL MICROSCOPIC DIAGNOSIS:  A. COLON, MASS, BIOPSY:  Invasive moderately differentiated adenocarcinoma  Tumor arises within ulcerated tubular adenoma with high-grade dysplasia  B. COLON, TRANSVERSE, DESCENDING, SIGMOID, POLYPECTOMY:  Tubular adenoma  Negative for high-grade dysplasia and carcinoma  C.  COLON, SPLENIC FLEXURE POLYP:  Tubular adenoma  Negative for high-grade dysplasia and carcinoma  D. COLON, DISTAL SIGMOID, POLYPECTOMY:  Tubular adenoma  Negative for high-grade dysplasia and carcinoma    12/30/2021 Imaging   CT AP IMPRESSION: No acute findings. No evidence of abdominal or pelvic metastatic disease.  Prior prostatectomy. Aortic Atherosclerosis (ICD10-I70.0).   01/30/2022 Imaging   CT chest IMPRESSION: 1. No evidence of pulmonary metastasis. 2. Coronary artery calcification and Aortic Atherosclerosis (ICD10-I70.0).     02/27/2022 Tumor Marker   CEA: 44.1   03/06/2022 Initial Diagnosis   Colon cancer ([place])   03/06/2022 Cancer Staging   Staging form: Colon and Rectum, AJCC 8th Edition - Pathologic stage from 03/06/2022: Stage IIA (pT3, pN0, cM0) - Signed by Alla Feeling, NP on 04/14/2022 Stage prefix: Initial diagnosis Total positive nodes: 0 Histologic grading system: 4 grade system Histologic grade (G): G2   03/06/2022 Surgery   PROCEDURE:  XI ROBOT ASSISTED PARTIAL RIGHT COLECTOMY   03/06/2022 Pathology Results   FINAL MICROSCOPIC DIAGNOSIS:  A. COLON, RIGHT, RESECTION:  - Invasive moderately differentiated adenocarcinoma, 3.5 cm, involving  cecum and proximal ascending colon  - Carcinoma invades into pericolonic soft tissue  - Resection margins are negative for carcinoma  - Seventeen benign lymph nodes (0/17)  - Two separate tubular adenomas  - Unremarkable appendiceal stump  Procedure: Resection, right colon  Tumor Site: Cecum and proximal ascending colon  Tumor Size: 3.5 cm  Macroscopic Tumor Perforation: Not identified  Histologic Type:  Adenocarcinoma  Histologic Grade: G2: Moderately differentiated  Multiple Primary Sites: Not applicable  Tumor Extension: Carcinoma invades into pericolonic soft tissue  Lymphovascular Invasion: Not identified  Perineural Invasion: Not identified  Treatment Effect: No known presurgical therapy  Margins:       Margin Status for Invasive Carcinoma: All margins negative for invasive carcinoma       Margin Status for Non-Invasive Tumor: All margins negative for high-grade dysplasia / intramucosal carcinoma and low-grade dysplasia  Regional Lymph Nodes:       Number of Lymph Nodes with Tumor: 0       Number of Lymph Nodes Examined: 17  Tumor Deposits: Not identified  Distant Metastasis:       Distant Site(s) Involved: Not applicable  Pathologic Stage Classification (pTNM, AJCC 8th Edition): pT3, pN0   MMR normal, MSI-stable      INTERVAL HISTORY:  [name] Short is here for a follow up of  colon cancer  He was last seen by me on  06/26/2022 He presents to the clinic alone. Pt states he has been eating good. Pt denies having diarrhea and stomach issues.  All other systems were reviewed with the patient and are negative.  MEDICAL HISTORY:  Past Medical History:  Diagnosis Date   Arthritis    ESP IN HANDS   Cancer ([place])    OCCULAR RIGHT EYE--SURGERY AND CHEMO  PT IS LEGALLY BLIND IN RT EYE   Cataract    LEFT EYE   Colon cancer ([place])    COPD (chronic obstructive pulmonary disease) ([place])    FORMER SMOKER   Erectile dysfunction    GERD (gastroesophageal reflux disease)    Glaucoma    ONLY IN RIGHT EYE   Hypertension    Lung cancer ([place])    lung ca dx 02/2010   Paresthesia and pain of right extremity    RIGHT SHOULDER    Pneumonia 2011   Prostate cancer ([place])    prostate ca dx 11/11   Shortness of breath    ONLY WITH EXERTION; HX OF LOBECTOMY FOR CANER   Sleep apnea     SURGICAL HISTORY: Past Surgical History:  Procedure Laterality Date   APPENDECTOMY  02/1999   BACK  SURGERY     BIOPSY  12/19/2021   Procedure: BIOPSY;  Surgeon: Rogene Houston, MD;  [facility];  Service: Endoscopy;;   CARDIAC CATHETERIZATION  2008   indication was due to LOC, chest tightness, diaphoreses; seen at [facility]; PER PATIENT , NORMAL ; no records in epic ; endoreses repeat intermittent sx since time of cath    COLONOSCOPY  08/06/2012   Procedure: COLONOSCOPY;  Surgeon: Rogene Houston, MD;  [facility];  Service: Endoscopy;  Laterality: N/A;  955   COLONOSCOPY N/A 08/08/2015   Procedure: COLONOSCOPY;  Surgeon: Rogene Houston, MD;  [facility];  Service: Endoscopy;  Laterality: N/A;  1200   COLONOSCOPY N/A 10/21/2018   Procedure: COLONOSCOPY;  Surgeon: Rogene Houston, MD;  [facility];  Service: Endoscopy;  Laterality: N/A;  1030   COLONOSCOPY WITH PROPOFOL N/A 12/19/2021   Procedure: COLONOSCOPY WITH PROPOFOL;  Surgeon: Rogene Houston, MD;  [facility];  Service: Endoscopy;  Laterality: N/A;  [place]  SURGERY     OCT 1999 DETACHED RETINA -RIGHT; MAY AND JUNE 2009 RIGHT EYE SURGERY FOR CANCER-LENS HAS BEEN REMOVED.   LEFT UPPER LOBECTOMY  03/2010   FOR CANCER   PENILE PROSTHESIS IMPLANT  08/10/2012   Procedure: PENILE PROTHESIS INFLATABLE;  Surgeon: Malka So, MD;  [facility];  Service: Urology;  Laterality: N/A;  IMPLANT 3 PIECE PENILE PROSTHESIS    POLYPECTOMY  10/21/2018   Procedure: POLYPECTOMY;  Surgeon: Rogene Houston, MD;  [facility];  Service: Endoscopy;;  colon   POLYPECTOMY  12/19/2021   Procedure: POLYPECTOMY;  Surgeon: Rogene Houston, MD;  [facility];  Service: Endoscopy;;   PROSTATECTOMY  08/2010   FOR CANCER   TOTAL KNEE ARTHROPLASTY Left 09/10/2017   Procedure: LEFT TOTAL KNEE ARTHROPLASTY;  Surgeon: Susa Day, MD;  [facility];  Service: Orthopedics;  Laterality: Left;  120 mins    I have reviewed the social history and  family history with the patient and they are unchanged from previous note.  ALLERGIES:  is allergic to silicone.  MEDICATIONS:  Current Outpatient Medications  Medication Sig Dispense Refill   acetaminophen (TYLENOL) 650 MG CR tablet Take 1,300 mg by mouth every morning.     amLODipine (NORVASC) 5 MG tablet Take 5 mg by mouth daily.     aspirin EC 81 MG tablet Take 1 tablet (81 mg total) by mouth daily. 30 tablet 11   atorvastatin (LIPITOR) 20 MG tablet Take 20 mg by mouth daily.     chlorthalidone (HYGROTON) 25 MG tablet Take 25 mg by mouth daily.      Cholecalciferol (VITAMIN D) 50 MCG (2000 UT) tablet Take 2,000 Units by mouth daily.     losartan (COZAAR) 100 MG tablet Take 100 mg by mouth daily.     meloxicam (MOBIC) 15 MG tablet Take 1 tablet (15 mg total) by mouth daily. 30 tablet 3   omeprazole (PRILOSEC) 20 MG capsule Take 20 mg by mouth every other day. OTC     triamcinolone cream (KENALOG) 0.1 % Apply 1 application. topically daily as needed for rash.     No current facility-administered medications for this visit.    PHYSICAL EXAMINATION: ECOG PERFORMANCE STATUS: 0 - Asymptomatic  Vitals:   10/30/22 1047  BP: 124/84  Pulse: 71  Resp: 18  Temp: 97.7 F (36.5 C)  SpO2: 100%   Wt Readings from Last 3 Encounters:  10/30/22 184 lb 9.6 oz (83.7 kg)  06/26/22 183 lb 11.2 oz (83.3 kg)  04/14/22 181 lb 8 oz (82.3 kg)     GENERAL:alert, no distress and comfortable SKIN: skin color, texture, turgor are normal, no rashes or significant lesions NECK: (-) supple, thyroid normal size, non-tender, without nodularity LYMPH: (-) no palpable lymphadenopathy in the cervical, axillary  LUNGS: clear to auscultation and percussion with normal breathing effort HEART:(-) regular rate & rhythm and no murmurs and no lower extremity edema ABDOMEN:(-) abdomen soft, non-tender and normal bowel sounds  LABORATORY DATA:  I have reviewed the data as listed    Latest Ref Rng & Units  10/30/2022   10:13 AM 06/26/2022   10:19 AM 05/22/2022   11:23 AM  CBC  WBC 4.0 - 10.5 K/uL 7.8  6.9  6.6   Hemoglobin 13.0 - 17.0 g/dL 13.7  12.4  12.1   Hematocrit 39.0 - 52.0 % 38.8  36.1  35.6   Platelets 150 - 400 K/uL 298  297  298  Latest Ref Rng & Units 10/30/2022   10:13 AM 06/26/2022   10:19 AM 05/22/2022   11:23 AM  CMP  Glucose 70 - 99 mg/dL 111  119  101   BUN 8 - 23 mg/dL 33  31  33   Creatinine 0.61 - 1.24 mg/dL 1.30  1.38  1.33   Sodium 135 - 145 mmol/L 136  137  137   Potassium 3.5 - 5.1 mmol/L 4.1  5.1  4.3   Chloride 98 - 111 mmol/L 105  106  107   CO2 22 - 32 mmol/L 21  26  24    Calcium 8.9 - 10.3 mg/dL 9.3  9.3  9.2   Total Protein 6.5 - 8.1 g/dL 7.6  7.8  7.3   Total Bilirubin 0.3 - 1.2 mg/dL 0.7  0.5  0.5   Alkaline Phos 38 - 126 U/L 72  73  70   AST 15 - 41 U/L 15  13  12    ALT 0 - 44 U/L 15  13  12        RADIOGRAPHIC STUDIES: I have personally reviewed the radiological images as listed and agreed with the findings in the report. No results found.    Orders Placed This Encounter  Procedures   CT CHEST ABDOMEN PELVIS W CONTRAST    Hold iv contrast if EGFR<50    Standing Status:   Future    Standing Expiration Date:   10/31/2023    Order Specific Question:   Preferred imaging location?    Answer:   [facility]    Order Specific Question:   Is Oral Contrast requested for this exam?    Answer:   Yes, Per Radiology protocol   All questions were answered. The patient knows to call the clinic with any problems, questions or concerns. No barriers to learning was detected. The total time spent in the appointment was 25 minutes.     Truitt Merle, MD 10/30/2022   Felicity Coyer, CMA, am acting as scribe for Truitt Merle, MD.   I have reviewed the above documentation for accuracy and completeness, and I agree with the above.

## 2022-10-30 NOTE — Assessment & Plan Note (Signed)
p(T3, N0) cM0 stage IIa -found to have a 3 cm cecal mass on routine colonoscopy on 12/19/21 by Dr. Laural Golden. Biopsy revealed moderately differentiated adenocarcinoma -Staging CT [facility] 12/30/21 and chest 01/30/22 were both negative for metastatic disease. -baseline CEA 02/27/22 elevated to 44 -s/p right hemicolectomy by Dr. Marcello Moores 03/06/22, path 3.5 cm mass invading into pericolonic soft tissue. Margins and lymph nodes negative. MMR normal, MSI stable -postoperative CEA dropped to WNL. -Guardant Reveal from 05/22/22 was negative. -He is clinically doing well, no concern for recurrence.  Plan to repeat surveillance CT scan in May 2024

## 2022-10-31 ENCOUNTER — Encounter (INDEPENDENT_AMBULATORY_CARE_PROVIDER_SITE_OTHER): Payer: Self-pay | Admitting: *Deleted

## 2022-10-31 ENCOUNTER — Other Ambulatory Visit: Payer: Self-pay

## 2022-10-31 DIAGNOSIS — C182 Malignant neoplasm of ascending colon: Secondary | ICD-10-CM

## 2022-10-31 DIAGNOSIS — C180 Malignant neoplasm of cecum: Secondary | ICD-10-CM

## 2022-10-31 NOTE — Progress Notes (Signed)
Referral order placed for Colonoscopy with Dr. Darnelle Maffucci. [place] with [facility] in [place], [place].

## 2022-11-12 ENCOUNTER — Telehealth (INDEPENDENT_AMBULATORY_CARE_PROVIDER_SITE_OTHER): Payer: Self-pay | Admitting: Gastroenterology

## 2022-11-12 NOTE — Telephone Encounter (Signed)
TCS noted in recall for July, a new questionnaire will be sent at that time

## 2022-11-12 NOTE — Telephone Encounter (Signed)
Called pt to schedule; pt states he is not due until July. Did not want to schedule at this time for that reason.

## 2022-11-12 NOTE — Telephone Encounter (Signed)
Who is your primary care physician: Asencion Noble  Reasons for the colonoscopy: Follow up from surgery  Have you had a colonoscopy before?  Yes every 3 years since 2009; Every 5 years for 1999 and 2004  Do you have family history of colon cancer?   Previous colonoscopy with polyps removed? Yes, all  Do you have a history colorectal cancer?   yes  Are you diabetic? If yes, Type 1 or Type 2?    No  Do you have a prosthetic or mechanical heart valve? No  Do you have a pacemaker/defibrillator?   No  Have you had endocarditis/atrial fibrillation? No  Have you had joint replacement within the last 12 months?  NO  Do you tend to be constipated or have to use laxatives? No  Do you have any history of drugs or alchohol?  No  Do you use supplemental oxygen?  No  Have you had a stroke or heart attack within the last 6 months?No  Do you take weight loss medication? No      Do you take any blood-thinning medications such as: (aspirin, warfarin, Plavix, Aggrenox)  Yes  If yes we need the name, milligram, dosage and who is prescribing doctor Aspirin 81 mg Current Outpatient Medications on File Prior to Visit  Medication Sig Dispense Refill   acetaminophen (TYLENOL) 650 MG CR tablet Take 1,300 mg by mouth every morning.     amLODipine (NORVASC) 5 MG tablet Take 5 mg by mouth daily.     aspirin EC 81 MG tablet Take 1 tablet (81 mg total) by mouth daily. 30 tablet 11   atorvastatin (LIPITOR) 20 MG tablet Take 20 mg by mouth daily.     chlorthalidone (HYGROTON) 25 MG tablet Take 25 mg by mouth daily.      Cholecalciferol (VITAMIN D) 50 MCG (2000 UT) tablet Take 2,000 Units by mouth daily.     losartan (COZAAR) 100 MG tablet Take 100 mg by mouth daily.     omeprazole (PRILOSEC) 20 MG capsule Take 20 mg by mouth every other day. OTC     meloxicam (MOBIC) 15 MG tablet Take 1 tablet (15 mg total) by mouth daily. (Patient not taking: Reported on 11/12/2022) 30 tablet 3   triamcinolone cream (KENALOG)  0.1 % Apply 1 application. topically daily as needed for rash. (Patient not taking: Reported on 11/12/2022)     No current facility-administered medications on file prior to visit.    Allergies  Allergen Reactions   Silicone     Caused redness     Pharmacy: [facility]  Primary Insurance Name: [employer] number where you can be reached: [phone]

## 2022-11-12 NOTE — Telephone Encounter (Signed)
Room 3 Thanks

## 2023-01-15 ENCOUNTER — Encounter (INDEPENDENT_AMBULATORY_CARE_PROVIDER_SITE_OTHER): Payer: Self-pay | Admitting: Gastroenterology

## 2023-01-15 ENCOUNTER — Ambulatory Visit (INDEPENDENT_AMBULATORY_CARE_PROVIDER_SITE_OTHER): Payer: Medicare HMO | Admitting: Gastroenterology

## 2023-01-15 VITALS — BP 123/80 | HR 72 | Temp 98.8°F | Ht 68.0 in | Wt 182.0 lb

## 2023-01-15 DIAGNOSIS — Z86010 Personal history of colonic polyps: Secondary | ICD-10-CM

## 2023-01-15 DIAGNOSIS — C180 Malignant neoplasm of cecum: Secondary | ICD-10-CM

## 2023-01-15 NOTE — Patient Instructions (Signed)
We will get you scheduled for surveillance colonoscopy in June/July Please let me know if you have any new or concerning GI symptoms  Follow up 1 year

## 2023-01-15 NOTE — Progress Notes (Addendum)
Referring Provider: Carylon Perches, MD Primary Care Physician:  Carylon Perches, MD Primary GI Physician: Levon Hedger   Chief Complaint  Patient presents with   Colon Cancer    Follow up on colon cancer. Pt states [facility] advised him he needed colonoscopy before July.    HPI:   [name] Short is a 77 y.o. male with past medical history of arthritis, colon cancer, COPD, ED, GERD, glaucoma, HTN, lunch cancer, Prostate, sleep apnea (does not wears CPAP)  Patient presenting today to schedule surveillance colonoscopy for history of cecal adenocarcinoma  Cecal cancer discovered on colonoscopy in April 2023, as below, patient underwent surgery in June 2023 for robotic assisted partial right colectomy (stage II colon cancer), negative margins and lymph nodes. Last CEA in feb 2024 was 2.19.  He follows with Dr. Mosetta Putt with Oncology. Planned surveillance CT scan in June 2024, Recommended to have colonoscopy around June/July 2024.   Patient denies any changes in bowels. He has a BM every morning. Denies rectal bleeding or melena. No abdominal pain. No weight loss or changes in appetite. No nausea or vomiting. He reports he was advised to have Colonoscopy by July by his oncologist.    CT chest w contrast 01/2022: no pulmonary mets CT A/P w contrast 12/2021 No acute findings. No evidence of abdominal or pelvic metastatic disease.Prior prostatectomy. Last Colonoscopy:12/2021- Likely malignant tumor in the cecum. Biopsied (.invasive moderately differentiated adenocarcinoma arising out of 5 tubular adenoma with dysplasia)                            - One small polyp in the distal transverse colon.                            Biopsied.                           - Five small polyps in the sigmoid colon and in the                            descending colon.                           - One 5 to 10 mm polyp at the splenic flexure,                            removed with a cold snare. Resected and  retrieved.                           - One 7 mm polyp in the mid sigmoid colon, removed                            with a cold snare. Resected and retrieved.                           - Diverticulosis in the sigmoid colon.                           - External hemorrhoids.  Last Endoscopy:  Recommendations:    Past Medical  History:  Diagnosis Date   Arthritis    ESP IN HANDS   Cancer (HCC)    OCCULAR RIGHT EYE--SURGERY AND CHEMO  PT IS LEGALLY BLIND IN RT EYE   Cataract    LEFT EYE   Colon cancer (HCC)    COPD (chronic obstructive pulmonary disease) (HCC)    FORMER SMOKER   Erectile dysfunction    GERD (gastroesophageal reflux disease)    Glaucoma    ONLY IN RIGHT EYE   Hypertension    Lung cancer (HCC)    lung ca dx 02/2010   Paresthesia and pain of right extremity    RIGHT SHOULDER    Pneumonia 2011   Prostate cancer (HCC)    prostate ca dx 11/11   Shortness of breath    ONLY WITH EXERTION; HX OF LOBECTOMY FOR CANER   Sleep apnea     Past Surgical History:  Procedure Laterality Date   APPENDECTOMY  02/1999   BACK SURGERY     BIOPSY  12/19/2021   Procedure: BIOPSY;  Surgeon: Malissa Hippo, MD;  [facility];  Service: Endoscopy;;   CARDIAC CATHETERIZATION  2008   indication was due to LOC, chest tightness, diaphoreses; seen at [facility]; PER PATIENT , NORMAL ; no records in epic ; endoreses repeat intermittent sx since time of cath    COLONOSCOPY  08/06/2012   Procedure: COLONOSCOPY;  Surgeon: Malissa Hippo, MD;  [facility]: [facility];  Service: Endoscopy;  Laterality: N/A;  955   COLONOSCOPY N/A 08/08/2015   Procedure: COLONOSCOPY;  Surgeon: Malissa Hippo, MD;  [facility]: [facility];  Service: Endoscopy;  Laterality: N/A;  1200   COLONOSCOPY N/A 10/21/2018   Procedure: COLONOSCOPY;  Surgeon: Malissa Hippo, MD;  [facility];  Service: Endoscopy;  Laterality: N/A;  1030   COLONOSCOPY WITH PROPOFOL N/A 12/19/2021   Procedure:  COLONOSCOPY WITH PROPOFOL;  Surgeon: Malissa Hippo, MD;  [facility];  Service: Endoscopy;  Laterality: N/A;  1225   ELBOW SURGERY     EYE SURGERY     OCT 1999 DETACHED RETINA -RIGHT; MAY AND JUNE 2009 RIGHT EYE SURGERY FOR CANCER-LENS HAS BEEN REMOVED.   LEFT UPPER LOBECTOMY  03/2010   FOR CANCER   PENILE PROSTHESIS IMPLANT  08/10/2012   Procedure: PENILE PROTHESIS INFLATABLE;  Surgeon: Anner Crete, MD;  Location: [facility];  Service: Urology;  Laterality: N/A;  IMPLANT 3 PIECE PENILE PROSTHESIS    POLYPECTOMY  10/21/2018   Procedure: POLYPECTOMY;  Surgeon: Malissa Hippo, MD;  [facility];  Service: Endoscopy;;  colon   POLYPECTOMY  12/19/2021   Procedure: POLYPECTOMY;  Surgeon: Malissa Hippo, MD;  [facility];  Service: Endoscopy;;   PROSTATECTOMY  08/2010   FOR CANCER   TOTAL KNEE ARTHROPLASTY Left 09/10/2017   Procedure: LEFT TOTAL KNEE ARTHROPLASTY;  Surgeon: Jene Every, MD;  Location: [facility];  Service: Orthopedics;  Laterality: Left;  120 mins    Current Outpatient Medications  Medication Sig Dispense Refill   acetaminophen (TYLENOL) 650 MG CR tablet Take 1,300 mg by mouth every morning.     amLODipine (NORVASC) 5 MG tablet Take 5 mg by mouth daily.     aspirin EC 81 MG tablet Take 1 tablet (81 mg total) by mouth daily. 30 tablet 11   atorvastatin (LIPITOR) 20 MG tablet Take 20 mg by mouth daily. Takes twice a week     chlorthalidone (  HYGROTON) 25 MG tablet Take 25 mg by mouth daily.      Cholecalciferol (VITAMIN D) 50 MCG (2000 UT) tablet Take 2,000 Units by mouth daily.     losartan (COZAAR) 100 MG tablet Take 100 mg by mouth daily.     omeprazole (PRILOSEC) 20 MG capsule Take 20 mg by mouth every other day. OTC     triamcinolone cream (KENALOG) 0.1 % Apply 1 application  topically daily as needed for rash.     No current facility-administered medications for this visit.    Allergies as of 01/15/2023 - Review Complete 01/15/2023   Allergen Reaction Noted   Silicone  10/21/2018    Family History  Problem Relation Age of Onset   Cancer Mother        Lung   Colon polyps Brother    Colon polyps Brother    Colon polyps Brother    Colon polyps Brother    Cancer Maternal Grandmother        Stomach   Cancer Maternal Grandfather        Stomach   Colon cancer Neg Hx     Social History   Socioeconomic History   Marital status: Married    Spouse name: Not on file   Number of children: 3   Years of education: Not on file   Highest education level: Not on file  Occupational History   Not on file  Tobacco Use   Smoking status: Former    Packs/day: 1.00    Years: 45.00    Additional pack years: 0.00    Total pack years: 45.00    Types: Cigarettes    Quit date: 09/07/2008    Years since quitting: 14.3    Passive exposure: Past   Smokeless tobacco: Former    Types: Chew    Quit date: 10/08/1985  Vaping Use   Vaping Use: Never used  Substance and Sexual Activity   Alcohol use: Not Currently    Comment: Drink beer for 30 years, quit 1990s   Drug use: No   Sexual activity: Not on file  Other Topics Concern   Not on file  Social History Narrative   Not on file   Social Determinants of Health   Financial Resource Strain: Not on file  Food Insecurity: Not on file  Transportation Needs: Not on file  Physical Activity: Not on file  Stress: Not on file  Social Connections: Not on file    Review of systems General: negative for malaise, night sweats, fever, chills, weight loss Neck: Negative for lumps, goiter, pain and significant neck swelling Resp: Negative for cough, wheezing, dyspnea at rest CV: Negative for chest pain, leg swelling, palpitations, orthopnea GI: denies melena, hematochezia, nausea, vomiting, diarrhea, constipation, dysphagia, odyonophagia, early satiety or unintentional weight loss.  MSK: Negative for joint pain or swelling, back pain, and muscle pain. Derm: Negative for itching  or rash Psych: Denies depression, anxiety, memory loss, confusion. No homicidal or suicidal ideation.  Heme: Negative for prolonged bleeding, bruising easily, and swollen nodes. Endocrine: Negative for cold or heat intolerance, polyuria, polydipsia and goiter. Neuro: negative for tremor, gait imbalance, syncope and seizures. The remainder of the review of systems is noncontributory.  Physical Exam: Ht 5\' 8"  (1.727 m)   Wt 182 lb (82.6 kg)   BMI 27.67 kg/m  General:   Alert and oriented. No distress noted. Pleasant and cooperative.  Head:  Normocephalic and atraumatic. Eyes:  Conjuctiva clear without scleral icterus. Mouth:  Oral mucosa pink and moist. Good dentition. No lesions. Heart: Normal rate and rhythm, s1 and s2 heart sounds present.  Lungs: Clear lung sounds in all lobes. Respirations equal and unlabored. Abdomen:  +BS, soft, non-tender and non-distended. No rebound or guarding. No HSM or masses noted. Derm: No palmar erythema or jaundice Msk:  Symmetrical without gross deformities. Normal posture. Extremities:  Without edema. Neurologic:  Alert and  oriented x4 Psych:  Alert and cooperative. Normal mood and affect.  Invalid input(s): "6 MONTHS"   ASSESSMENT: [name] Short is a 77 y.o. male presenting today to schedule surveillance colonoscopy for history of cecal adenocarcinoma.  Cecal adenocarcinoma discovered on colonoscopy in April 2023, stage II, no chemo or radiation, patient underwent partial right colectomy, he is followed by Dr. Mosetta Putt with oncology and advised to have repeat Colonoscopy June/July 2024. He has no GI symptoms and is doing well today. Will get him scheduled for surveillance colonoscopy in June/July 2024. Indications, risks and benefits of procedure discussed in detail with patient. Patient verbalized understanding and is in agreement to proceed with Colonoscopy   PLAN:  Schedule colonoscopy (around June/July) ASA III   All questions were answered,  patient verbalized understanding and is in agreement with plan as outlined above.    Follow Up: 1 year   [name] Cannata L. Jeanmarie Hubert, MSN, APRN, AGNP-C Adult-Gerontology Nurse Practitioner [facility]  I have reviewed the note and agree with the APP's assessment as described in this progress note  Katrinka Blazing, MD Gastroenterology and Hepatology [facility]

## 2023-01-16 ENCOUNTER — Telehealth (INDEPENDENT_AMBULATORY_CARE_PROVIDER_SITE_OTHER): Payer: Self-pay | Admitting: Gastroenterology

## 2023-01-16 MED ORDER — PEG 3350-KCL-NA BICARB-NACL 420 G PO SOLR: 4000.0000 mL | Freq: Once | ORAL | 0 refills | Status: AC

## 2023-01-16 NOTE — Telephone Encounter (Signed)
Pt in office yesterday and needing colonoscopy scheduled. Pt contacted and scheduled. Prep sent to pharmacy. Instructions mailed to patient. Will contact pt with pre op appt.

## 2023-01-28 ENCOUNTER — Encounter (INDEPENDENT_AMBULATORY_CARE_PROVIDER_SITE_OTHER): Payer: Self-pay

## 2023-02-20 ENCOUNTER — Encounter (HOSPITAL_COMMUNITY): Payer: Self-pay

## 2023-02-20 ENCOUNTER — Ambulatory Visit (HOSPITAL_COMMUNITY)
Admission: RE | Admit: 2023-02-20 | Discharge: 2023-02-20 | Disposition: A | Discharge status: Home / Self Care | Payer: Medicare HMO | Source: Ambulatory Visit | Attending: Hematology | Admitting: Hematology

## 2023-02-20 DIAGNOSIS — C182 Malignant neoplasm of ascending colon: Secondary | ICD-10-CM | POA: Insufficient documentation

## 2023-02-20 LAB — POCT I-STAT CREATININE: Creatinine, Ser: 1.4 mg/dL — ABNORMAL HIGH (ref 0.61–1.24)

## 2023-02-20 MED ORDER — IOHEXOL 9 MG/ML PO SOLN
1000.0000 mL | ORAL | Status: AC
  Administered 2023-02-20: 1000 mL via ORAL

## 2023-02-20 MED ORDER — IOHEXOL 9 MG/ML PO SOLN
ORAL | Status: AC
  Filled 2023-02-20: qty 1000

## 2023-02-20 MED ORDER — IOHEXOL 300 MG/ML  SOLN
100.0000 mL | Freq: Once | INTRAMUSCULAR | Status: AC | PRN
  Administered 2023-02-20: 100 mL via INTRAVENOUS

## 2023-02-23 ENCOUNTER — Other Ambulatory Visit: Payer: Medicare HMO

## 2023-02-23 ENCOUNTER — Other Ambulatory Visit: Payer: Self-pay

## 2023-02-23 ENCOUNTER — Inpatient Hospital Stay: Payer: Medicare HMO

## 2023-02-23 ENCOUNTER — Encounter: Payer: Self-pay | Admitting: Hematology

## 2023-02-23 ENCOUNTER — Inpatient Hospital Stay: Payer: Medicare HMO | Attending: Hematology | Admitting: Hematology

## 2023-02-23 VITALS — BP 119/66 | HR 68 | Temp 98.1°F | Resp 18 | Ht 68.0 in | Wt 178.4 lb

## 2023-02-23 DIAGNOSIS — Z79899 Other long term (current) drug therapy: Secondary | ICD-10-CM | POA: Diagnosis not present

## 2023-02-23 DIAGNOSIS — C180 Malignant neoplasm of cecum: Secondary | ICD-10-CM | POA: Diagnosis present

## 2023-02-23 DIAGNOSIS — C182 Malignant neoplasm of ascending colon: Secondary | ICD-10-CM | POA: Diagnosis not present

## 2023-02-23 LAB — CMP (CANCER CENTER ONLY)
ALT: 14 U/L (ref 0–44)
AST: 15 U/L (ref 15–41)
Albumin: 4.5 g/dL (ref 3.5–5.0)
Alkaline Phosphatase: 69 U/L (ref 38–126)
Anion gap: 9 (ref 5–15)
BUN: 39 mg/dL — ABNORMAL HIGH (ref 8–23)
CO2: 25 mmol/L (ref 22–32)
Calcium: 9.4 mg/dL (ref 8.9–10.3)
Chloride: 106 mmol/L (ref 98–111)
Creatinine: 1.51 mg/dL — ABNORMAL HIGH (ref 0.61–1.24)
GFR, Estimated: 48 mL/min — ABNORMAL LOW (ref >60)
Glucose, Bld: 103 mg/dL — ABNORMAL HIGH (ref 70–99)
Potassium: 4.5 mmol/L (ref 3.5–5.1)
Sodium: 140 mmol/L (ref 135–145)
Total Bilirubin: 0.4 mg/dL (ref 0.3–1.2)
Total Protein: 8 g/dL (ref 6.5–8.1)

## 2023-02-23 LAB — CBC WITH DIFFERENTIAL (CANCER CENTER ONLY)
Abs Immature Granulocytes: 0.02 10*3/uL (ref 0.00–0.07)
Basophils Absolute: 0.1 10*3/uL (ref 0.0–0.1)
Basophils Relative: 1 %
Eosinophils Absolute: 0.2 10*3/uL (ref 0.0–0.5)
Eosinophils Relative: 3 %
HCT: 38.4 % — ABNORMAL LOW (ref 39.0–52.0)
Hemoglobin: 13.2 g/dL (ref 13.0–17.0)
Immature Granulocytes: 0 %
Lymphocytes Relative: 34 %
Lymphs Abs: 2.4 10*3/uL (ref 0.7–4.0)
MCH: 31.7 pg (ref 26.0–34.0)
MCHC: 34.4 g/dL (ref 30.0–36.0)
MCV: 92.1 fL (ref 80.0–100.0)
Monocytes Absolute: 0.8 10*3/uL (ref 0.1–1.0)
Monocytes Relative: 12 %
Neutro Abs: 3.5 10*3/uL (ref 1.7–7.7)
Neutrophils Relative %: 50 %
Platelet Count: 258 10*3/uL (ref 150–400)
RBC: 4.17 MIL/uL — ABNORMAL LOW (ref 4.22–5.81)
RDW: 13.2 % (ref 11.5–15.5)
WBC Count: 7 10*3/uL (ref 4.0–10.5)
nRBC: 0 % (ref 0.0–0.2)

## 2023-02-23 LAB — CEA (IN HOUSE-CHCC): CEA (CHCC-In House): 1.65 ng/mL (ref 0.00–5.00)

## 2023-02-23 NOTE — Assessment & Plan Note (Signed)
p(T3, N0) cM0 stage IIa, MSS -found to have a 3 cm cecal mass on routine colonoscopy on 12/19/21 by [name]. Biopsy revealed moderately differentiated adenocarcinoma -Staging CT [facility] 12/30/21 and chest 01/30/22 were both negative for metastatic disease. -baseline CEA 02/27/22 elevated to 44 -s/p right hemicolectomy by [name] 03/06/22, path 3.5 cm mass invading into pericolonic soft tissue. Margins and lymph nodes negative. MMR normal, MSI stable -postoperative CEA dropped to WNL. -Guardant Reveal from 05/22/22 was negative. -surveillance CT scan 02/20/2023 showed

## 2023-02-23 NOTE — Progress Notes (Signed)
[facility]   [phone] [phone]   Clinic Follow up Note   Patient Care Team: Carylon Perches, MD as PCP - General (Internal Medicine) Malissa Hippo, MD (Inactive) as Consulting Physician (Gastroenterology) [name], MD as Consulting Physician (General Surgery) [name], MD as Consulting Physician (Hematology) [name], NP as Nurse Practitioner (Nurse Practitioner)  Date of Service:  02/23/2023  CHIEF COMPLAINT: f/u of colon cancer   CURRENT THERAPY:  Surveillance   ASSESSMENT:  [name] Short is a 77 y.o. male with   Colon cancer (HCC) p(T3, N0) cM0 stage IIa, MSS -found to have a 3 cm cecal mass on routine colonoscopy on 12/19/21 by [name]. Biopsy revealed moderately differentiated adenocarcinoma -Staging CT [facility] 12/30/21 and chest 01/30/22 were both negative for metastatic disease. -baseline CEA 02/27/22 elevated to 44 -s/p right hemicolectomy by [name] 03/06/22, path 3.5 cm mass invading into pericolonic soft tissue. Margins and lymph nodes negative. MMR normal, MSI stable -postoperative CEA dropped to WNL. -Guardant Reveal from 05/22/22 was negative. -surveillance CT scan 02/20/2023 showed NED, I personally reviewed the CT images and discussed the findings with him  -continue cancer surveillance     PLAN: -lab reviewed -CMP-stable - I encourage the pt to drink plenty of water. -I reviewed the CT scan with pt - I repeat a CT Scan in 1 year -lab and f/u in 4 months   SUMMARY OF ONCOLOGIC HISTORY: Oncology History Overview Note   Cancer Staging  Cancer of left lung [facility]) Staging form: Lung, AJCC 7th Edition - Pathologic: pT2pN0cM0 - Signed by [name], MD on 05/27/2012 Cancer stage: pT2pN0cM0  Colon cancer [facility]) Staging form: Colon and Rectum, AJCC 8th Edition - Pathologic stage from 03/06/2022: Stage IIA (pT3, pN0, cM0) - Signed by [name], NP on 04/14/2022 Stage prefix: Initial diagnosis Total  positive nodes: 0 Histologic grading system: 4 grade system Histologic grade (G): G2     Colon cancer (HCC)  12/19/2021 Procedure   Colonoscopy impression, by [name] - Likely malignant tumor in the cecum. Biopsied. - One small polyp in the distal transverse colon. Biopsied. - Five small polyps in the sigmoid colon and in the descending colon. - One 5 to 10 mm polyp at the splenic flexure, removed with a cold snare. Resected and retrieved. - One 7 mm polyp in the mid sigmoid colon, removed with a cold snare. Resected and retrieved. - Diverticulosis in the sigmoid colon. - External hemorrhoids.   12/19/2021 Initial Biopsy   FINAL MICROSCOPIC DIAGNOSIS:  A. COLON, MASS, BIOPSY:  Invasive moderately differentiated adenocarcinoma  Tumor arises within ulcerated tubular adenoma with high-grade dysplasia  B. COLON, TRANSVERSE, DESCENDING, SIGMOID, POLYPECTOMY:  Tubular adenoma  Negative for high-grade dysplasia and carcinoma  C.  COLON, SPLENIC FLEXURE POLYP:  Tubular adenoma  Negative for high-grade dysplasia and carcinoma  D. COLON, DISTAL SIGMOID, POLYPECTOMY:  Tubular adenoma  Negative for high-grade dysplasia and carcinoma    12/30/2021 Imaging   CT AP IMPRESSION: No acute findings. No evidence of abdominal or pelvic metastatic disease.  Prior prostatectomy. Aortic Atherosclerosis (ICD10-I70.0).   01/30/2022 Imaging   CT chest IMPRESSION: 1. No evidence of pulmonary metastasis. 2. Coronary artery calcification and Aortic Atherosclerosis (ICD10-I70.0).     02/27/2022 Tumor Marker   CEA: 44.1   03/06/2022 Initial Diagnosis   Colon cancer (HCC)   03/06/2022 Cancer Staging   Staging form: Colon and Rectum, AJCC 8th Edition - Pathologic stage from 03/06/2022:  Stage IIA (pT3, pN0, cM0) - Signed by [name], NP on 04/14/2022 Stage prefix: Initial diagnosis Total positive nodes: 0 Histologic grading system: 4 grade system Histologic grade (G): G2   03/06/2022 Surgery    PROCEDURE:  XI ROBOT ASSISTED PARTIAL RIGHT COLECTOMY   03/06/2022 Pathology Results   FINAL MICROSCOPIC DIAGNOSIS:  A. COLON, RIGHT, RESECTION:  - Invasive moderately differentiated adenocarcinoma, 3.5 cm, involving  cecum and proximal ascending colon  - Carcinoma invades into pericolonic soft tissue  - Resection margins are negative for carcinoma  - Seventeen benign lymph nodes (0/17)  - Two separate tubular adenomas  - Unremarkable appendiceal stump  Procedure: Resection, right colon  Tumor Site: Cecum and proximal ascending colon  Tumor Size: 3.5 cm  Macroscopic Tumor Perforation: Not identified  Histologic Type: Adenocarcinoma  Histologic Grade: G2: Moderately differentiated  Multiple Primary Sites: Not applicable  Tumor Extension: Carcinoma invades into pericolonic soft tissue  Lymphovascular Invasion: Not identified  Perineural Invasion: Not identified  Treatment Effect: No known presurgical therapy  Margins:       Margin Status for Invasive Carcinoma: All margins negative for invasive carcinoma       Margin Status for Non-Invasive Tumor: All margins negative for high-grade dysplasia / intramucosal carcinoma and low-grade dysplasia  Regional Lymph Nodes:       Number of Lymph Nodes with Tumor: 0       Number of Lymph Nodes Examined: 17  Tumor Deposits: Not identified  Distant Metastasis:       Distant Site(s) Involved: Not applicable  Pathologic Stage Classification (pTNM, AJCC 8th Edition): pT3, pN0   MMR normal, MSI-stable   02/20/2023 Imaging    IMPRESSION: 1. No evidence of metastatic disease in the chest, abdomen, or pelvis. 2. Aortic Atherosclerosis (ICD10-I70.0) and Emphysema (ICD10-J43.9).        INTERVAL HISTORY:  [name] Short is here for a follow up of colon cancer . He was last seen by me on 10/30/2022. He presents to the clinic alone. Pt state that he is doing well. Pt state that his BM is regular, he's being watching what he eats. Pt is overall  clinically doing well. 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     All other systems were reviewed with the patient and are negative.  MEDICAL HISTORY:  Past Medical History:  Diagnosis Date   Arthritis    ESP IN HANDS   Cancer (HCC)    OCCULAR RIGHT EYE--SURGERY AND CHEMO  PT IS LEGALLY BLIND IN RT EYE   Cataract    LEFT EYE   Colon cancer (HCC)    COPD (chronic obstructive pulmonary disease) (HCC)    FORMER SMOKER   Erectile dysfunction    GERD (gastroesophageal reflux disease)    Glaucoma    ONLY IN RIGHT EYE   Hypertension    Lung cancer (HCC)    lung ca dx 02/2010   Paresthesia and pain of right extremity    RIGHT SHOULDER    Pneumonia 2011   Prostate cancer (HCC)    prostate ca dx 11/11   Shortness of breath    ONLY WITH EXERTION; HX OF LOBECTOMY FOR CANER   Sleep apnea     SURGICAL HISTORY: Past Surgical History:  Procedure Laterality Date   APPENDECTOMY  02/1999   BACK SURGERY     BIOPSY  12/19/2021   Procedure: BIOPSY;  Surgeon: Malissa Hippo, MD;  [facility];  Service: Endoscopy;;  CARDIAC CATHETERIZATION  2008   indication was due to LOC, chest tightness, diaphoreses; seen at [facility]; PER PATIENT , NORMAL ; no records in epic ; endoreses repeat intermittent sx since time of cath    COLONOSCOPY  08/06/2012   Procedure: COLONOSCOPY;  Surgeon: Malissa Hippo, MD;  [facility];  Service: Endoscopy;  Laterality: N/A;  955   COLONOSCOPY N/A 08/08/2015   Procedure: COLONOSCOPY;  Surgeon: Malissa Hippo, MD;  [facility];  Service: Endoscopy;  Laterality: N/A;  1200   COLONOSCOPY N/A 10/21/2018   Procedure: COLONOSCOPY;  Surgeon: Malissa Hippo, MD;  [facility];  Service: Endoscopy;  Laterality: N/A;  1030   COLONOSCOPY WITH PROPOFOL N/A 12/19/2021   Procedure: COLONOSCOPY WITH PROPOFOL;  Surgeon: Malissa Hippo, MD;  [facility];  Service: Endoscopy;  Laterality: N/A;  1225   ELBOW  SURGERY     EYE SURGERY     OCT 1999 DETACHED RETINA -RIGHT; MAY AND JUNE 2009 RIGHT EYE SURGERY FOR CANCER-LENS HAS BEEN REMOVED.   LEFT UPPER LOBECTOMY  03/2010   FOR CANCER   PENILE PROSTHESIS IMPLANT  08/10/2012   Procedure: PENILE PROTHESIS INFLATABLE;  Surgeon: Anner Crete, MD;  Location: [facility];  Service: Urology;  Laterality: N/A;  IMPLANT 3 PIECE PENILE PROSTHESIS    POLYPECTOMY  10/21/2018   Procedure: POLYPECTOMY;  Surgeon: Malissa Hippo, MD;  [facility];  Service: Endoscopy;;  colon   POLYPECTOMY  12/19/2021   Procedure: POLYPECTOMY;  Surgeon: Malissa Hippo, MD;  [facility];  Service: Endoscopy;;   PROSTATECTOMY  08/2010   FOR CANCER   TOTAL KNEE ARTHROPLASTY Left 09/10/2017   Procedure: LEFT TOTAL KNEE ARTHROPLASTY;  Surgeon: Jene Every, MD;  Location: [facility];  Service: Orthopedics;  Laterality: Left;  120 mins    I have reviewed the social history and family history with the patient and they are unchanged from previous note.  ALLERGIES:  is allergic to silicone.  MEDICATIONS:  Current Outpatient Medications  Medication Sig Dispense Refill   acetaminophen (TYLENOL) 650 MG CR tablet Take 1,300 mg by mouth every morning.     amLODipine (NORVASC) 5 MG tablet Take 5 mg by mouth daily.     aspirin EC 81 MG tablet Take 1 tablet (81 mg total) by mouth daily. 30 tablet 11   atorvastatin (LIPITOR) 20 MG tablet Take 20 mg by mouth daily. Takes twice a week     chlorthalidone (HYGROTON) 25 MG tablet Take 25 mg by mouth daily.      Cholecalciferol (VITAMIN D) 50 MCG (2000 UT) tablet Take 2,000 Units by mouth daily.     losartan (COZAAR) 100 MG tablet Take 100 mg by mouth daily.     omeprazole (PRILOSEC) 20 MG capsule Take 20 mg by mouth every other day. OTC     triamcinolone cream (KENALOG) 0.1 % Apply 1 application  topically daily as needed for rash.     No current facility-administered medications for this visit.    PHYSICAL  EXAMINATION: ECOG PERFORMANCE STATUS: 0 - Asymptomatic  Vitals:   02/23/23 1450  BP: 119/66  Pulse: 68  Resp: 18  Temp: 98.1 F (36.7 C)  SpO2: 98%   Wt Readings from Last 3 Encounters:  02/23/23 178 lb 6.4 oz (80.9 kg)  01/15/23 182 lb (82.6 kg)  10/30/22 184 lb 9.6 oz (83.7 kg)     GENERAL:alert, no distress and  comfortable SKIN: skin color normal, no rashes or significant lesions EYES: normal, Conjunctiva are pink and non-injected, sclera clear  NEURO: alert & oriented x 3 with fluent speech LABORATORY DATA:  I have reviewed the data as listed    Latest Ref Rng & Units 02/23/2023    2:00 PM 10/30/2022   10:13 AM 06/26/2022   10:19 AM  CBC  WBC 4.0 - 10.5 K/uL 7.0  7.8  6.9   Hemoglobin 13.0 - 17.0 g/dL 16.1  09.6  04.5   Hematocrit 39.0 - 52.0 % 38.4  38.8  36.1   Platelets 150 - 400 K/uL 258  298  297         Latest Ref Rng & Units 02/23/2023    2:00 PM 02/20/2023    3:05 PM 10/30/2022   10:13 AM  CMP  Glucose 70 - 99 mg/dL 409   811   BUN 8 - 23 mg/dL 39   33   Creatinine 9.14 - 1.24 mg/dL 7.82  9.56  2.13   Sodium 135 - 145 mmol/L 140   136   Potassium 3.5 - 5.1 mmol/L 4.5   4.1   Chloride 98 - 111 mmol/L 106   105   CO2 22 - 32 mmol/L 25   21   Calcium 8.9 - 10.3 mg/dL 9.4   9.3   Total Protein 6.5 - 8.1 g/dL 8.0   7.6   Total Bilirubin 0.3 - 1.2 mg/dL 0.4   0.7   Alkaline Phos 38 - 126 U/L 69   72   AST 15 - 41 U/L 15   15   ALT 0 - 44 U/L 14   15       RADIOGRAPHIC STUDIES: I have personally reviewed the radiological images as listed and agreed with the findings in the report. No results found.    No orders of the defined types were placed in this encounter.  All questions were answered. The patient knows to call the clinic with any problems, questions or concerns. No barriers to learning was detected. The total time spent in the appointment was 25 minutes.     [name], MD 02/23/2023   Carolin Coy, CMA, am acting as scribe for [name], MD.   I have reviewed the above documentation for accuracy and completeness, and I agree with the above.   \

## 2023-03-04 NOTE — Patient Instructions (Signed)
[name] Short  03/04/2023     @PREFPERIOPPHARMACY @   Your procedure is scheduled on 03/10/2023.  Report to [facility] at 8:30 A.M.  Call this number if you have problems the morning of surgery:  [phone]  If you experience any cold or flu symptoms such as cough, fever, chills, shortness of breath, etc. between now and your scheduled surgery, please notify us at the above number.   Remember:   Please Follow the diet and prep instructions given to you by Dr Wilburt Finlay office.   Take these medicines the morning of surgery with A SIP OF WATER : Amlodipine Omeprazole    Do not wear jewelry, make-up or nail polish, including gel polish,  artificial nails, or any other type of covering on natural nails (fingers and  toes).  Do not wear lotions, powders, or perfumes, or deodorant.  Do not shave 48 hours prior to surgery.  Men may shave face and neck.  Do not bring valuables to the hospital.  [facility] is not responsible for any belongings or valuables.  Contacts, dentures or bridgework may not be worn into surgery.  Leave your suitcase in the car.  After surgery it may be brought to your room.  For patients admitted to the hospital, discharge time will be determined by your treatment team.  Patients discharged the day of surgery will not be allowed to drive home.   Name and phone number of your driver:   Family Special instructions:  N/A  Please read over the following fact sheets that you were given. Care and Recovery After Surgery  Colonoscopy, Adult A colonoscopy is a procedure to look at the entire large intestine. This procedure is done using a long, thin, flexible tube that has a camera on the end. You may have a colonoscopy: As a part of normal colorectal screening. If you have certain symptoms, such as: A low number of red blood cells in your blood (anemia). Diarrhea that does not go away. Pain in your abdomen. Blood in your stool. A colonoscopy can help screen  for and diagnose medical problems, including: An abnormal growth of cells or tissue (tumor). Abnormal growths within the lining of your intestine (polyps). Inflammation. Areas of bleeding. Tell your health care provider about: Any allergies you have. All medicines you are taking, including vitamins, herbs, eye drops, creams, and over-the-counter medicines. Any problems you or family members have had with anesthetic medicines. Any bleeding problems you have. Any surgeries you have had. Any medical conditions you have. Any problems you have had with having bowel movements. Whether you are pregnant or may be pregnant. What are the risks? Generally, this is a safe procedure. However, problems may occur, including: Bleeding. Damage to your intestine. Allergic reactions to medicines given during the procedure. Infection. This is rare. What happens before the procedure? Eating and drinking restrictions Follow instructions from your health care provider about eating or drinking restrictions, which may include: A few days before the procedure: Follow a low-fiber diet. Avoid nuts, seeds, dried fruit, raw fruits, and vegetables. 1-3 days before the procedure: Eat only gelatin dessert or ice pops. Drink only clear liquids, such as water, clear juice, clear broth or bouillon, black coffee or tea, or clear soft drinks or sports drinks. Avoid liquids that contain red or purple dye. The day of the procedure: Do not eat solid foods. You may continue to drink clear liquids until up to 2 hours before the procedure. Do not eat or drink  anything starting 2 hours before the procedure, or within the time period that your health care provider recommends. Bowel prep If you were prescribed a bowel prep to take by mouth (orally) to clean out your colon: Take it as told by your health care provider. Starting the day before your procedure, you will need to drink a large amount of liquid medicine. The liquid  will cause you to have many bowel movements of loose stool until your stool becomes almost clear or light green. If your skin or the opening between the buttocks (anus) gets irritated from diarrhea, you may relieve the irritation using: Wipes with medicine in them, such as adult wet wipes with aloe and vitamin E. A product to soothe skin, such as petroleum jelly. If you vomit while drinking the bowel prep: Take a break for up to 60 minutes. Begin the bowel prep again. Call your health care provider if you keep vomiting or you cannot take the bowel prep without vomiting. To clean out your colon, you may also be given: Laxative medicines. These help you have a bowel movement. Instructions for enema use. An enema is liquid medicine injected into your rectum. Medicines Ask your health care provider about: Changing or stopping your regular medicines or supplements. This is especially important if you are taking iron supplements, diabetes medicines, or blood thinners. Taking medicines such as aspirin and ibuprofen. These medicines can thin your blood. Do not take these medicines unless your health care provider tells you to take them. Taking over-the-counter medicines, vitamins, herbs, and supplements. General instructions Ask your health care provider what steps will be taken to help prevent infection. These may include washing skin with a germ-killing soap. If you will be going home right after the procedure, plan to have a responsible adult: Take you home from the hospital or clinic. You will not be allowed to drive. Care for you for the time you are told. What happens during the procedure?  An IV will be inserted into one of your veins. You will be given a medicine to make you fall asleep (general anesthetic). You will lie on your side with your knees bent. A lubricant will be put on the tube. Then the tube will be: Inserted into your anus. Gently eased through all parts of your large  intestine. Air will be sent into your colon to keep it open. This may cause some pressure or cramping. Images will be taken with the camera and will appear on a screen. A small tissue sample may be removed to be looked at under a microscope (biopsy). The tissue may be sent to a lab for testing if any signs of problems are found. If small polyps are found, they may be removed and checked for cancer cells. When the procedure is finished, the tube will be removed. The procedure may vary among health care providers and hospitals. What happens after the procedure? Your blood pressure, heart rate, breathing rate, and blood oxygen level will be monitored until you leave the hospital or clinic. You may have a small amount of blood in your stool. You may pass gas and have mild cramping or bloating in your abdomen. This is caused by the air that was used to open your colon during the exam. If you were given a sedative during the procedure, it can affect you for several hours. Do not drive or operate machinery until your health care provider says that it is safe. It is up to you to get the  results of your procedure. Ask your health care provider, or the department that is doing the procedure, when your results will be ready. Summary A colonoscopy is a procedure to look at the entire large intestine. Follow instructions from your health care provider about eating and drinking before the procedure. If you were prescribed an oral bowel prep to clean out your colon, take it as told by your health care provider. During the colonoscopy, a flexible tube with a camera on its end is inserted into the anus and then passed into all parts of the large intestine. This information is not intended to replace advice given to you by your health care provider. Make sure you discuss any questions you have with your health care provider. Document Revised: 10/14/2022 Document Reviewed: 04/24/2021 Elsevier Patient Education   2024 [employer]  Monitored Anesthesia Care Anesthesia refers to the techniques, procedures, and medicines that help a person stay safe and comfortable during surgery. Monitored anesthesia care, or sedation, is one type of anesthesia. You may have sedation if you do not need to be asleep for your procedure. Procedures that use sedation may include: Surgery to remove cataracts from your eyes. A dental procedure. A biopsy. This is when a tissue sample is removed and looked at under a microscope. You will be watched closely during your procedure. Your level of sedation or type of anesthesia may be changed to fit your needs. Tell a health care provider about: Any allergies you have. All medicines you are taking, including vitamins, herbs, eye drops, creams, and over-the-counter medicines. Any problems you or family members have had with anesthesia. Any bleeding problems you have. Any surgeries you have had. Any medical conditions or illnesses you have. This includes sleep apnea, cough, fever, or the flu. Whether you are pregnant or may be pregnant. Whether you use cigarettes, alcohol, or drugs. Any use of steroids, whether by mouth or as a cream. What are the risks? Your health care provider will talk with you about risks. These may include: Getting too much medicine (oversedation). Nausea. Allergic reactions to medicines. Trouble breathing. If this happens, a breathing tube may be used to help you breathe. It will be removed when you are awake and breathing on your own. Heart trouble. Lung trouble. Confusion that gets better with time (emergence delirium). What happens before the procedure? When to stop eating and drinking Follow instructions from your health care provider about what you may eat and drink. These may include: 8 hours before your procedure Stop eating most foods. Do not eat meat, fried foods, or fatty foods. Eat only light foods, such as toast or crackers. All liquids  are okay except energy drinks and alcohol. 6 hours before your procedure Stop eating. Drink only clear liquids, such as water, clear fruit juice, black coffee, plain tea, and sports drinks. Do not drink energy drinks or alcohol. 2 hours before your procedure Stop drinking all liquids. You may be allowed to take medicines with small sips of water. If you do not follow your health care provider's instructions, your procedure may be delayed or canceled. Medicines Ask your health care provider about: Changing or stopping your regular medicines. These include any diabetes medicines or blood thinners you take. Taking medicines such as aspirin and ibuprofen. These medicines can thin your blood. Do not take them unless your health care provider tells you to. Taking over-the-counter medicines, vitamins, herbs, and supplements. Testing You may have an exam or testing. You may have a blood or  urine sample taken. General instructions Do not use any products that contain nicotine or tobacco for at least 4 weeks before the procedure. These products include cigarettes, chewing tobacco, and vaping devices, such as e-cigarettes. If you need help quitting, ask your health care provider. If you will be going home right after the procedure, plan to have a responsible adult: Take you home from the hospital or clinic. You will not be allowed to drive. Care for you for the time you are told. What happens during the procedure?  Your blood pressure, heart rate, breathing, level of pain, and blood oxygen level will be monitored. An IV will be inserted into one of your veins. You may be given: A sedative. This helps you relax. Anesthesia. This will: Numb certain areas of your body. Make you fall asleep for surgery. You will be given medicines as needed to keep you comfortable. The more medicine you are given, the deeper your level of sedation will be. Your level of sedation may be changed to fit your needs.  There are three levels of sedation: Mild sedation. At this level, you may feel awake and relaxed. You will be able to follow directions. Moderate sedation. At this level, you will be sleepy. You may not remember the procedure. Deep sedation. At this level, you will be asleep. You will not remember the procedure. How you get the medicines will depend on your age and the procedure. They may be given as: A pill. This may be taken by mouth (orally) or inserted into the rectum. An injection. This may be into a vein or muscle. A spray through the nose. After your procedure is over, the medicine will be stopped. The procedure may vary among health care providers and hospitals. What happens after the procedure? Your blood pressure, heart rate, breathing rate, and blood oxygen level will be monitored until you leave the hospital or clinic. You may feel sleepy, clumsy, or nauseous. You may not remember what happened during or after the procedure. Sedation can affect you for several hours. Do not drive or use machinery until your health care provider says that it is safe. This information is not intended to replace advice given to you by your health care provider. Make sure you discuss any questions you have with your health care provider. Document Revised: 01/27/2022 Document Reviewed: 01/27/2022 Elsevier Patient Education  2024 [employer].

## 2023-03-05 ENCOUNTER — Encounter (HOSPITAL_COMMUNITY): Payer: Self-pay

## 2023-03-05 ENCOUNTER — Encounter (HOSPITAL_COMMUNITY)
Admission: RE | Admit: 2023-03-05 | Discharge: 2023-03-05 | Disposition: A | Discharge status: Home / Self Care | Payer: Medicare HMO | Source: Ambulatory Visit | Attending: Gastroenterology | Admitting: Gastroenterology

## 2023-03-05 VITALS — BP 119/66 | HR 68 | Temp 98.1°F | Resp 18 | Ht 68.0 in | Wt 178.4 lb

## 2023-03-05 DIAGNOSIS — I10 Essential (primary) hypertension: Secondary | ICD-10-CM | POA: Insufficient documentation

## 2023-03-05 DIAGNOSIS — T502X5A Adverse effect of carbonic-anhydrase inhibitors, benzothiadiazides and other diuretics, initial encounter: Secondary | ICD-10-CM | POA: Insufficient documentation

## 2023-03-05 DIAGNOSIS — E876 Hypokalemia: Secondary | ICD-10-CM | POA: Insufficient documentation

## 2023-03-05 DIAGNOSIS — Z01818 Encounter for other preprocedural examination: Secondary | ICD-10-CM | POA: Insufficient documentation

## 2023-03-05 HISTORY — DX: Chronic kidney disease, unspecified: N18.9

## 2023-03-05 LAB — BASIC METABOLIC PANEL
Anion gap: 10 (ref 5–15)
BUN: 28 mg/dL — ABNORMAL HIGH (ref 8–23)
CO2: 23 mmol/L (ref 22–32)
Calcium: 8.7 mg/dL — ABNORMAL LOW (ref 8.9–10.3)
Chloride: 103 mmol/L (ref 98–111)
Creatinine, Ser: 1.24 mg/dL (ref 0.61–1.24)
GFR, Estimated: >60 mL/min (ref >60)
Glucose, Bld: 108 mg/dL — ABNORMAL HIGH (ref 70–99)
Potassium: 3.9 mmol/L (ref 3.5–5.1)
Sodium: 136 mmol/L (ref 135–145)

## 2023-03-10 ENCOUNTER — Ambulatory Visit (HOSPITAL_COMMUNITY): Payer: Medicare HMO | Admitting: Certified Registered"

## 2023-03-10 ENCOUNTER — Encounter (HOSPITAL_COMMUNITY)
Admission: RE | Disposition: A | Discharge status: Home / Self Care | Payer: Self-pay | Source: Ambulatory Visit | Attending: Gastroenterology

## 2023-03-10 ENCOUNTER — Ambulatory Visit (HOSPITAL_COMMUNITY)
Admission: RE | Admit: 2023-03-10 | Discharge: 2023-03-10 | Disposition: A | Discharge status: Home / Self Care | Payer: Medicare HMO | Source: Ambulatory Visit | Attending: Gastroenterology | Admitting: Gastroenterology

## 2023-03-10 ENCOUNTER — Encounter (HOSPITAL_COMMUNITY): Payer: Self-pay | Admitting: Gastroenterology

## 2023-03-10 ENCOUNTER — Ambulatory Visit (HOSPITAL_BASED_OUTPATIENT_CLINIC_OR_DEPARTMENT_OTHER): Payer: Medicare HMO | Admitting: Certified Registered"

## 2023-03-10 DIAGNOSIS — Z85038 Personal history of other malignant neoplasm of large intestine: Secondary | ICD-10-CM

## 2023-03-10 DIAGNOSIS — K649 Unspecified hemorrhoids: Secondary | ICD-10-CM | POA: Diagnosis not present

## 2023-03-10 DIAGNOSIS — K635 Polyp of colon: Secondary | ICD-10-CM

## 2023-03-10 DIAGNOSIS — Z86010 Personal history of colonic polyps: Secondary | ICD-10-CM

## 2023-03-10 DIAGNOSIS — D120 Benign neoplasm of cecum: Secondary | ICD-10-CM

## 2023-03-10 DIAGNOSIS — I129 Hypertensive chronic kidney disease with stage 1 through stage 4 chronic kidney disease, or unspecified chronic kidney disease: Secondary | ICD-10-CM | POA: Diagnosis not present

## 2023-03-10 DIAGNOSIS — K219 Gastro-esophageal reflux disease without esophagitis: Secondary | ICD-10-CM | POA: Insufficient documentation

## 2023-03-10 DIAGNOSIS — Z87891 Personal history of nicotine dependence: Secondary | ICD-10-CM | POA: Insufficient documentation

## 2023-03-10 DIAGNOSIS — J449 Chronic obstructive pulmonary disease, unspecified: Secondary | ICD-10-CM | POA: Insufficient documentation

## 2023-03-10 DIAGNOSIS — D124 Benign neoplasm of descending colon: Secondary | ICD-10-CM

## 2023-03-10 DIAGNOSIS — K5140 Inflammatory polyps of colon without complications: Secondary | ICD-10-CM | POA: Insufficient documentation

## 2023-03-10 DIAGNOSIS — Z85840 Personal history of malignant neoplasm of eye: Secondary | ICD-10-CM | POA: Insufficient documentation

## 2023-03-10 DIAGNOSIS — K5730 Diverticulosis of large intestine without perforation or abscess without bleeding: Secondary | ICD-10-CM | POA: Insufficient documentation

## 2023-03-10 DIAGNOSIS — Z08 Encounter for follow-up examination after completed treatment for malignant neoplasm: Secondary | ICD-10-CM | POA: Diagnosis present

## 2023-03-10 DIAGNOSIS — Z85118 Personal history of other malignant neoplasm of bronchus and lung: Secondary | ICD-10-CM | POA: Diagnosis not present

## 2023-03-10 DIAGNOSIS — N189 Chronic kidney disease, unspecified: Secondary | ICD-10-CM | POA: Insufficient documentation

## 2023-03-10 DIAGNOSIS — Z8546 Personal history of malignant neoplasm of prostate: Secondary | ICD-10-CM | POA: Insufficient documentation

## 2023-03-10 DIAGNOSIS — D123 Benign neoplasm of transverse colon: Secondary | ICD-10-CM | POA: Insufficient documentation

## 2023-03-10 DIAGNOSIS — Z980 Intestinal bypass and anastomosis status: Secondary | ICD-10-CM | POA: Diagnosis not present

## 2023-03-10 DIAGNOSIS — K648 Other hemorrhoids: Secondary | ICD-10-CM | POA: Diagnosis not present

## 2023-03-10 DIAGNOSIS — I10 Essential (primary) hypertension: Secondary | ICD-10-CM

## 2023-03-10 DIAGNOSIS — Z83719 Family history of colon polyps, unspecified: Secondary | ICD-10-CM | POA: Diagnosis not present

## 2023-03-10 DIAGNOSIS — G4730 Sleep apnea, unspecified: Secondary | ICD-10-CM | POA: Diagnosis not present

## 2023-03-10 HISTORY — PX: POLYPECTOMY: SHX5525

## 2023-03-10 HISTORY — PX: COLONOSCOPY WITH PROPOFOL: SHX5780

## 2023-03-10 HISTORY — PX: SUBMUCOSAL LIFTING INJECTION: SHX6855

## 2023-03-10 LAB — HM COLONOSCOPY

## 2023-03-10 SURGERY — COLONOSCOPY WITH PROPOFOL
Anesthesia: General

## 2023-03-10 MED ORDER — EPHEDRINE 5 MG/ML INJ
INTRAVENOUS | Status: AC
  Filled 2023-03-10: qty 5

## 2023-03-10 MED ORDER — GLYCOPYRROLATE PF 0.2 MG/ML IJ SOSY
PREFILLED_SYRINGE | INTRAMUSCULAR | Status: DC | PRN
  Administered 2023-03-10: .2 mg via INTRAVENOUS

## 2023-03-10 MED ORDER — LACTATED RINGERS IV SOLN: INTRAVENOUS | Status: DC

## 2023-03-10 MED ORDER — PROPOFOL 10 MG/ML IV BOLUS
INTRAVENOUS | Status: DC | PRN
  Administered 2023-03-10: 100 mg via INTRAVENOUS
  Administered 2023-03-10 (×2): 40 mg via INTRAVENOUS

## 2023-03-10 MED ORDER — PHENYLEPHRINE 80 MCG/ML (10ML) SYRINGE FOR IV PUSH (FOR BLOOD PRESSURE SUPPORT)
PREFILLED_SYRINGE | INTRAVENOUS | Status: AC
  Filled 2023-03-10: qty 10

## 2023-03-10 MED ORDER — PROPOFOL 500 MG/50ML IV EMUL
INTRAVENOUS | Status: DC | PRN
  Administered 2023-03-10: 150 ug/kg/min via INTRAVENOUS

## 2023-03-10 MED ORDER — EPHEDRINE SULFATE-NACL 50-0.9 MG/10ML-% IV SOSY
PREFILLED_SYRINGE | INTRAVENOUS | Status: DC | PRN
  Administered 2023-03-10 (×3): 5 mg via INTRAVENOUS

## 2023-03-10 MED ORDER — LIDOCAINE HCL (CARDIAC) PF 100 MG/5ML IV SOSY
PREFILLED_SYRINGE | INTRAVENOUS | Status: DC | PRN
  Administered 2023-03-10: 50 mg via INTRAVENOUS

## 2023-03-10 MED ORDER — PHENYLEPHRINE 80 MCG/ML (10ML) SYRINGE FOR IV PUSH (FOR BLOOD PRESSURE SUPPORT)
PREFILLED_SYRINGE | INTRAVENOUS | Status: DC | PRN
  Administered 2023-03-10: 160 ug via INTRAVENOUS
  Administered 2023-03-10: 120 ug via INTRAVENOUS
  Administered 2023-03-10: 80 ug via INTRAVENOUS
  Administered 2023-03-10: 160 ug via INTRAVENOUS

## 2023-03-10 NOTE — Anesthesia Procedure Notes (Signed)
Date/Time: 03/10/2023 10:12 AM  Performed by: Julian Reil, CRNAPre-anesthesia Checklist: Patient identified, Emergency Drugs available, Suction available and Patient being monitored Patient Re-evaluated:Patient Re-evaluated prior to induction Oxygen Delivery Method: Nasal cannula Induction Type: IV induction Placement Confirmation: positive ETCO2

## 2023-03-10 NOTE — H&P (Signed)
[name] Short is an 77 y.o. male.   Chief Complaint: history of colon cancer HPI: 77 y/o M with PMH colon cancer s/p resection, COPD, GERD, glaucoma, ocular cancer, lung cancer, prostate cancer and hypertension, who comes with colon cancer.  The patient denies having any nausea, vomiting, fever, chills, hematochezia, melena, hematemesis, abdominal distention, abdominal pain, diarrhea, jaundice, pruritus or weight loss.  No family history of colon cancer.  Past Medical History:  Diagnosis Date   Arthritis    ESP IN HANDS   Cancer (HCC)    OCCULAR RIGHT EYE--SURGERY AND CHEMO  PT IS LEGALLY BLIND IN RT EYE   Cataract    LEFT EYE   Chronic kidney disease    Colon cancer (HCC)    COPD (chronic obstructive pulmonary disease) (HCC)    FORMER SMOKER   Erectile dysfunction    GERD (gastroesophageal reflux disease)    Glaucoma    ONLY IN RIGHT EYE   Hypertension    Lung cancer (HCC)    lung ca dx 02/2010   Paresthesia and pain of right extremity    RIGHT SHOULDER    Pneumonia 2011   Prostate cancer (HCC)    prostate ca dx 11/11   Shortness of breath    ONLY WITH EXERTION; HX OF LOBECTOMY FOR CANER   Sleep apnea     Past Surgical History:  Procedure Laterality Date   APPENDECTOMY  02/1999   BACK SURGERY     BIOPSY  12/19/2021   Procedure: BIOPSY;  Surgeon: Malissa Hippo, MD;  Location: [facility];  Service: Endoscopy;;   CARDIAC CATHETERIZATION  2008   indication was due to LOC, chest tightness, diaphoreses; seen at [facility]; PER PATIENT , NORMAL ; no records in epic ; endoreses repeat intermittent sx since time of cath    COLONOSCOPY  08/06/2012   Procedure: COLONOSCOPY;  Surgeon: Malissa Hippo, MD;  Location: [facility];  Service: Endoscopy;  Laterality: N/A;  955   COLONOSCOPY N/A 08/08/2015   Procedure: COLONOSCOPY;  Surgeon: Malissa Hippo, MD;  Location: [facility];  Service: Endoscopy;  Laterality: N/A;  1200   COLONOSCOPY N/A 10/21/2018    Procedure: COLONOSCOPY;  Surgeon: Malissa Hippo, MD;  Location: [facility];  Service: Endoscopy;  Laterality: N/A;  1030   COLONOSCOPY WITH PROPOFOL N/A 12/19/2021   Procedure: COLONOSCOPY WITH PROPOFOL;  Surgeon: Malissa Hippo, MD;  [facility]: [facility];  Service: Endoscopy;  Laterality: N/A;  1225   ELBOW SURGERY     EYE SURGERY     OCT 1999 DETACHED RETINA -RIGHT; MAY AND JUNE 2009 RIGHT EYE SURGERY FOR CANCER-LENS HAS BEEN REMOVED.   LEFT UPPER LOBECTOMY  03/2010   FOR CANCER   PENILE PROSTHESIS IMPLANT  08/10/2012   Procedure: PENILE PROTHESIS INFLATABLE;  Surgeon: Anner Crete, MD;  Location: [facility];  Service: Urology;  Laterality: N/A;  IMPLANT 3 PIECE PENILE PROSTHESIS    POLYPECTOMY  10/21/2018   Procedure: POLYPECTOMY;  Surgeon: Malissa Hippo, MD;  [facility]: [facility];  Service: Endoscopy;;  colon   POLYPECTOMY  12/19/2021   Procedure: POLYPECTOMY;  Surgeon: Malissa Hippo, MD;  [facility]: [facility];  Service: Endoscopy;;   PROSTATECTOMY  08/2010   FOR CANCER   TOTAL KNEE ARTHROPLASTY Left 09/10/2017   Procedure: LEFT TOTAL KNEE ARTHROPLASTY;  Surgeon: Jene Every, MD;  Location: [facility];  Service: Orthopedics;  Laterality: Left;  120 mins  Family History  Problem Relation Age of Onset   Cancer Mother        Lung   Colon polyps Brother    Colon polyps Brother    Colon polyps Brother    Colon polyps Brother    Cancer Maternal Grandmother        Stomach   Cancer Maternal Grandfather        Stomach   Colon cancer Neg Hx    Social History:  reports that he quit smoking about 14 years ago. His smoking use included cigarettes. He has a 45.00 pack-year smoking history. He has been exposed to tobacco smoke. He quit smokeless tobacco use about 37 years ago.  His smokeless tobacco use included chew. He reports that he does not currently use alcohol. He reports that he does not use drugs.  Allergies:  Allergies  Allergen Reactions   Silicone     Caused  redness    Medications Prior to Admission  Medication Sig Dispense Refill   acetaminophen (TYLENOL) 650 MG CR tablet Take 1,300 mg by mouth every morning.     amLODipine (NORVASC) 5 MG tablet Take 5 mg by mouth daily.     aspirin EC 81 MG tablet Take 1 tablet (81 mg total) by mouth daily. 30 tablet 11   atorvastatin (LIPITOR) 20 MG tablet Take 20 mg by mouth daily. Takes twice a week     chlorthalidone (HYGROTON) 25 MG tablet Take 25 mg by mouth daily.      Cholecalciferol (VITAMIN D) 50 MCG (2000 UT) tablet Take 2,000 Units by mouth daily.     losartan (COZAAR) 100 MG tablet Take 100 mg by mouth daily.     Omeprazole 20 MG TBEC Take 20 mg by mouth every other day.     triamcinolone cream (KENALOG) 0.1 % Apply 1 application  topically daily as needed for rash.      No results found for this or any previous visit (from the past 48 hour(s)). No results found.  Review of Systems  All other systems reviewed and are negative.   Blood pressure (!) 146/75, pulse 72, temperature 97.9 F (36.6 C), resp. rate 16, SpO2 97 %. Physical Exam  GENERAL: The patient is AO x3, in no acute distress. HEENT: Head is normocephalic and atraumatic. EOMI are intact. Mouth is well hydrated and without lesions. NECK: Supple. No masses LUNGS: Clear to auscultation. No presence of rhonchi/wheezing/rales. Adequate chest expansion HEART: RRR, normal s1 and s2. ABDOMEN: Soft, nontender, no guarding, no peritoneal signs, and nondistended. BS +. No masses. EXTREMITIES: Without any cyanosis, clubbing, rash, lesions or edema. NEUROLOGIC: AOx3, no focal motor deficit. SKIN: no jaundice, no rashes  Assessment/Plan 77 y/o M with PMH colon cancer s/p resection, COPD, GERD, glaucoma, ocular cancer, lung cancer, prostate cancer and hypertension, who comes with colon cancer.  Will proceed with colonoscopy.  Dolores Frame, MD 03/10/2023, 10:02 AM

## 2023-03-10 NOTE — Transfer of Care (Signed)
Immediate Anesthesia Transfer of Care Note  Patient: [name] Short  Procedure(s) Performed: COLONOSCOPY WITH PROPOFOL POLYPECTOMY SUBMUCOSAL LIFTING INJECTION  Patient Location: Short Stay  Anesthesia Type:General  Level of Consciousness: drowsy  Airway & Oxygen Therapy: Patient Spontanous Breathing and Patient connected to nasal cannula oxygen  Post-op Assessment: Report given to RN and Post -op Vital signs reviewed and stable  Post vital signs: Reviewed and stable  Last Vitals:  Vitals Value Taken Time  BP    Temp    Pulse    Resp    SpO2      Last Pain:  Vitals:   03/10/23 1007  PainSc: 0-No pain         Complications: No notable events documented.

## 2023-03-10 NOTE — Discharge Instructions (Addendum)
You are being discharged to home.  Resume your previous diet.  We are waiting for your pathology results.  Your physician has recommended a repeat colonoscopy for surveillance based on pathology results.  

## 2023-03-10 NOTE — Anesthesia Preprocedure Evaluation (Signed)
Anesthesia Evaluation  Patient identified by MRN, date of birth, ID band Patient awake    Reviewed: Allergy & Precautions, H&P , NPO status , Patient's Chart, lab work & pertinent test results, reviewed documented beta blocker date and time   Airway Mallampati: II  TM Distance: >3 FB Neck ROM: full    Dental no notable dental hx.    Pulmonary neg pulmonary ROS, shortness of breath, sleep apnea , pneumonia, COPD, former smoker   Pulmonary exam normal breath sounds clear to auscultation       Cardiovascular Exercise Tolerance: Good hypertension, negative cardio ROS  Rhythm:regular Rate:Normal     Neuro/Psych negative neurological ROS  negative psych ROS   GI/Hepatic negative GI ROS, Neg liver ROS,GERD  ,,  Endo/Other  negative endocrine ROS    Renal/GU Renal diseasenegative Renal ROS  negative genitourinary   Musculoskeletal   Abdominal   Peds  Hematology negative hematology ROS (+)   Anesthesia Other Findings   Reproductive/Obstetrics negative OB ROS                             Anesthesia Physical Anesthesia Plan  ASA: 3  Anesthesia Plan: General   Post-op Pain Management:    Induction:   PONV Risk Score and Plan: Propofol infusion  Airway Management Planned:   Additional Equipment:   Intra-op Plan:   Post-operative Plan:   Informed Consent: I have reviewed the patients History and Physical, chart, labs and discussed the procedure including the risks, benefits and alternatives for the proposed anesthesia with the patient or authorized representative who has indicated his/her understanding and acceptance.     Dental Advisory Given  Plan Discussed with: CRNA  Anesthesia Plan Comments:        Anesthesia Quick Evaluation

## 2023-03-11 LAB — SURGICAL PATHOLOGY

## 2023-03-11 NOTE — Op Note (Signed)
[facility] Patient Name: [name] Short Procedure Date: 03/10/2023 9:59 AM MRN: [id number] Date of Birth: 1946/05/10 Attending MD: Katrinka Blazing , , [id number] CSN: [id number] Age: 77 Admit Type: Outpatient Procedure:                Colonoscopy Indications:              High risk colon cancer surveillance: Personal                            history of colon cancer Providers:                Katrinka Blazing, Buel Ream. Thomasena Edis RN, RN, Cyril Mourning, Technician Referring MD:              Medicines:                Monitored Anesthesia Care Complications:            No immediate complications. Estimated Blood Loss:     Estimated blood loss: none. Procedure:                Pre-Anesthesia Assessment:                           - Prior to the procedure, a History and Physical                            was performed, and patient medications, allergies                            and sensitivities were reviewed. The patient's                            tolerance of previous anesthesia was reviewed.                           - The risks and benefits of the procedure and the                            sedation options and risks were discussed with the                            patient. All questions were answered and informed                            consent was obtained.                           - ASA Grade Assessment: III - A patient with severe                            systemic disease.                           After obtaining informed consent, the colonoscope  was passed under direct vision. Throughout the                            procedure, the patient's blood pressure, pulse, and                            oxygen saturations were monitored continuously. The                            [id number] ([id number]) was introduced through the                            anus and advanced to the the terminal ileum. The                             colonoscopy was performed without difficulty. The                            patient tolerated the procedure well. The quality                            of the bowel preparation was excellent. Scope In: 10:12:23 AM Scope Out: 10:43:09 AM Scope Withdrawal Time: 0 hours 27 minutes 2 seconds  Total Procedure Duration: 0 hours 30 minutes 46 seconds  Findings:      The perianal and digital rectal examinations were normal.      The neo-terminal ileum appeared normal.      There was evidence of a prior side-to-side ileo-colonic anastomosis in       the ascending colon. This was patent and was characterized by healthy       appearing mucosa. The anastomosis was traversed.      An 8 mm polyp was found in the cecum, in proximity to a surgical scar.       The polyp was semi-sessile. Area was successfully injected with 2.5 mL       [place] for a lift polypectomy. Imaging was performed using white light       and narrow band imaging to visualize the mucosa and demarcate the polyp       site after injection for EMR purposes. The polyp was removed with a cold       snare in most of its entirety. However, there was presence of a very       flat area which had to be removed with a Jumbo forceps - I noticed       presence of sutures in the submucosa (granulation tissue?) . Resection       and retrieval were complete.      Two sessile polyps were found in the descending colon and transverse       colon. The polyps were 3 to 4 mm in size. These polyps were removed with       a cold snare. Resection and retrieval were complete.      Scattered medium-mouthed and small-mouthed diverticula were found in the       entire colon.      Non-bleeding internal hemorrhoids were found during retroflexion. The       hemorrhoids were small. Impression:               -  The examined portion of the ileum was normal.                           - Patent side-to-side ileo-colonic anastomosis,                             characterized by healthy appearing mucosa.                           - One 8 mm polyp in the cecum, removed with a cold                            snare. Resected and retrieved via EMR. ?Possible                            granulation tissue.                           - Two 3 to 4 mm polyps in the descending colon and                            in the transverse colon, removed with a cold snare.                            Resected and retrieved.                           - Diverticulosis in the entire examined colon.                           - Non-bleeding internal hemorrhoids. Moderate Sedation:      Per Anesthesia Care Recommendation:           - Discharge patient to home (ambulatory).                           - Resume previous diet.                           - Await pathology results.                           - Repeat colonoscopy for surveillance based on                            pathology results. If cecal polyp was adenomatous,                            will need to repeat in one year. Procedure Code(s):        --- Professional ---                           [phone], 59, Colonoscopy, flexible; with removal of  tumor(s), polyp(s), or other lesion(s) by snare                            technique                           [id number], Colonoscopy, flexible; with directed                            submucosal injection(s), any substance Diagnosis Code(s):        --- Professional ---                           N02.725, Personal history of other malignant                            neoplasm of large intestine                           K64.8, Other hemorrhoids                           Z98.0, Intestinal bypass and anastomosis status                           D12.0, Benign neoplasm of cecum                           D12.4, Benign neoplasm of descending colon                           D12.3, Benign neoplasm of transverse colon (hepatic                             flexure or splenic flexure)                           K57.30, Diverticulosis of large intestine without                            perforation or abscess without bleeding CPT copyright 2022 [employer]. All rights reserved. The codes documented in this report are preliminary and upon coder review may  be revised to meet current compliance requirements. Katrinka Blazing, MD Katrinka Blazing,  03/11/2023 11:11:52 AM This report has been signed electronically. Number of Addenda: 0

## 2023-03-12 NOTE — Anesthesia Postprocedure Evaluation (Signed)
Anesthesia Post Note  Patient: [name] Short  Procedure(s) Performed: COLONOSCOPY WITH PROPOFOL POLYPECTOMY SUBMUCOSAL LIFTING INJECTION  Patient location during evaluation: Phase II Anesthesia Type: General Level of consciousness: awake Pain management: pain level controlled Vital Signs Assessment: post-procedure vital signs reviewed and stable Respiratory status: spontaneous breathing and respiratory function stable Cardiovascular status: blood pressure returned to baseline and stable Postop Assessment: no headache and no apparent nausea or vomiting Anesthetic complications: no Comments: Late entry   No notable events documented.   Last Vitals:  Vitals:   03/10/23 1048 03/10/23 1052  BP: (!) 96/52 102/68  Pulse: 86   Resp: (!) 9   Temp: (!) 36.4 C   SpO2: 100%     Last Pain:  Vitals:   03/11/23 1408  PainSc: 0-No pain                 Windell Norfolk

## 2023-03-13 ENCOUNTER — Encounter (HOSPITAL_COMMUNITY): Payer: Self-pay | Admitting: Gastroenterology

## 2023-03-16 ENCOUNTER — Encounter (INDEPENDENT_AMBULATORY_CARE_PROVIDER_SITE_OTHER): Payer: Self-pay | Admitting: *Deleted

## 2023-06-16 DIAGNOSIS — R2230 Localized swelling, mass and lump, unspecified upper limb: Secondary | ICD-10-CM | POA: Insufficient documentation

## 2023-06-19 ENCOUNTER — Other Ambulatory Visit: Payer: Self-pay

## 2023-06-19 DIAGNOSIS — C182 Malignant neoplasm of ascending colon: Secondary | ICD-10-CM

## 2023-06-19 DIAGNOSIS — C180 Malignant neoplasm of cecum: Secondary | ICD-10-CM

## 2023-06-19 DIAGNOSIS — C3492 Malignant neoplasm of unspecified part of left bronchus or lung: Secondary | ICD-10-CM

## 2023-06-21 ENCOUNTER — Other Ambulatory Visit: Payer: Self-pay | Admitting: Hematology

## 2023-06-21 NOTE — Assessment & Plan Note (Signed)
p(T3, N0) cM0 stage IIa, MSS -found to have a 3 cm cecal mass on routine colonoscopy on 12/19/21 by [name]. Biopsy revealed moderately differentiated adenocarcinoma -Staging CT AP 12/30/21 and chest 01/30/22 were both negative for metastatic disease. -baseline CEA 02/27/22 elevated to 44 -s/p right hemicolectomy by [name] 03/06/22, path 3.5 cm mass invading into pericolonic soft tissue. Margins and lymph nodes negative. MMR normal, MSI stable -postoperative CEA dropped to WNL. -Guardant Reveal from 05/22/22 and 07/2022 were negative. -surveillance CT scan 02/20/2023 showed NED

## 2023-06-22 ENCOUNTER — Inpatient Hospital Stay: Payer: Medicare HMO | Attending: Hematology

## 2023-06-22 ENCOUNTER — Encounter: Payer: Self-pay | Admitting: Hematology

## 2023-06-22 ENCOUNTER — Inpatient Hospital Stay (HOSPITAL_BASED_OUTPATIENT_CLINIC_OR_DEPARTMENT_OTHER): Payer: Medicare HMO | Admitting: Hematology

## 2023-06-22 VITALS — BP 137/73 | HR 57 | Temp 97.8°F | Resp 18 | Ht 68.0 in | Wt 182.5 lb

## 2023-06-22 DIAGNOSIS — C182 Malignant neoplasm of ascending colon: Secondary | ICD-10-CM

## 2023-06-22 DIAGNOSIS — M25512 Pain in left shoulder: Secondary | ICD-10-CM | POA: Insufficient documentation

## 2023-06-22 DIAGNOSIS — C180 Malignant neoplasm of cecum: Secondary | ICD-10-CM | POA: Insufficient documentation

## 2023-06-22 DIAGNOSIS — M25511 Pain in right shoulder: Secondary | ICD-10-CM | POA: Insufficient documentation

## 2023-06-22 DIAGNOSIS — M542 Cervicalgia: Secondary | ICD-10-CM | POA: Diagnosis not present

## 2023-06-22 DIAGNOSIS — G8929 Other chronic pain: Secondary | ICD-10-CM | POA: Diagnosis not present

## 2023-06-22 DIAGNOSIS — Z79899 Other long term (current) drug therapy: Secondary | ICD-10-CM | POA: Insufficient documentation

## 2023-06-22 DIAGNOSIS — C3492 Malignant neoplasm of unspecified part of left bronchus or lung: Secondary | ICD-10-CM

## 2023-06-22 LAB — CBC WITH DIFFERENTIAL (CANCER CENTER ONLY)
Abs Immature Granulocytes: 0.02 10*3/uL (ref 0.00–0.07)
Basophils Absolute: 0.1 10*3/uL (ref 0.0–0.1)
Basophils Relative: 1 %
Eosinophils Absolute: 0.2 10*3/uL (ref 0.0–0.5)
Eosinophils Relative: 3 %
HCT: 36.3 % — ABNORMAL LOW (ref 39.0–52.0)
Hemoglobin: 12.6 g/dL — ABNORMAL LOW (ref 13.0–17.0)
Immature Granulocytes: 0 %
Lymphocytes Relative: 38 %
Lymphs Abs: 2.4 10*3/uL (ref 0.7–4.0)
MCH: 32.1 pg (ref 26.0–34.0)
MCHC: 34.7 g/dL (ref 30.0–36.0)
MCV: 92.6 fL (ref 80.0–100.0)
Monocytes Absolute: 0.5 10*3/uL (ref 0.1–1.0)
Monocytes Relative: 8 %
Neutro Abs: 3.2 10*3/uL (ref 1.7–7.7)
Neutrophils Relative %: 50 %
Platelet Count: 247 10*3/uL (ref 150–400)
RBC: 3.92 MIL/uL — ABNORMAL LOW (ref 4.22–5.81)
RDW: 13.2 % (ref 11.5–15.5)
WBC Count: 6.5 10*3/uL (ref 4.0–10.5)
nRBC: 0 % (ref 0.0–0.2)

## 2023-06-22 LAB — CMP (CANCER CENTER ONLY)
ALT: 14 U/L (ref 0–44)
AST: 14 U/L — ABNORMAL LOW (ref 15–41)
Albumin: 4.3 g/dL (ref 3.5–5.0)
Alkaline Phosphatase: 66 U/L (ref 38–126)
Anion gap: 8 (ref 5–15)
BUN: 36 mg/dL — ABNORMAL HIGH (ref 8–23)
CO2: 24 mmol/L (ref 22–32)
Calcium: 9.3 mg/dL (ref 8.9–10.3)
Chloride: 108 mmol/L (ref 98–111)
Creatinine: 1.37 mg/dL — ABNORMAL HIGH (ref 0.61–1.24)
GFR, Estimated: 53 mL/min — ABNORMAL LOW (ref >60)
Glucose, Bld: 91 mg/dL (ref 70–99)
Potassium: 4.3 mmol/L (ref 3.5–5.1)
Sodium: 140 mmol/L (ref 135–145)
Total Bilirubin: 0.5 mg/dL (ref 0.3–1.2)
Total Protein: 7.5 g/dL (ref 6.5–8.1)

## 2023-06-22 LAB — CEA (ACCESS): CEA (CHCC): 1.95 ng/mL (ref 0.00–5.00)

## 2023-06-22 NOTE — Progress Notes (Signed)
[name]   [phone] [phone]   Clinic Follow up Note   Patient Care Team: Carylon Perches, MD as PCP - General (Internal Medicine) Malissa Hippo, MD (Inactive) as Consulting Physician (Gastroenterology) [name], MD as Consulting Physician (General Surgery) [name], MD as Consulting Physician (Hematology) [name], NP as Nurse Practitioner (Nurse Practitioner)  Date of Service:  06/22/2023  CHIEF COMPLAINT: f/u of colon cancer   CURRENT THERAPY:  Cancer surveillance  Oncology History   Colon cancer (HCC) p(T3, N0) cM0 stage IIa, MSS -found to have a 3 cm cecal mass on routine colonoscopy on 12/19/21 by [name]. Biopsy revealed moderately differentiated adenocarcinoma -Staging CT AP 12/30/21 and chest 01/30/22 were both negative for metastatic disease. -baseline CEA 02/27/22 elevated to 44 -s/p right hemicolectomy by [name] 03/06/22, path 3.5 cm mass invading into pericolonic soft tissue. Margins and lymph nodes negative. MMR normal, MSI stable -postoperative CEA dropped to WNL. -Guardant Reveal from 05/22/22 and 07/2022 were negative. -surveillance CT scan 02/20/2023 showed NED     Assessment and Plan    Colon Cancer Stable since diagnosis in April 2023. No new symptoms. Regular bowel movements without blood. Stable weight. Last colonoscopy in June 2024 with benign polyps removed. -Plan for next scan in June 2025. -Continue regular follow-ups every four months.  Neck and Shoulder Pain Chronic pain in neck and shoulders, likely due to arthritis. Pain is tolerable. -Continue current management with Tylenol arthritis pills.  Possible Abdominal Hernia Patient reports bulging in the stomach, possibly due to a hernia or muscle separation from previous surgery. No associated pain or discomfort. -No immediate intervention required. Monitor for changes or worsening symptoms.  General Health Maintenance -Regular follow-ups  with primary care physician -Next appointment with nurse practitioner in four months        SUMMARY OF ONCOLOGIC HISTORY: Oncology History Overview Note   Cancer Staging  Cancer of left lung [facility]) Staging form: Lung, AJCC 7th Edition - Pathologic: pT2pN0cM0 - Signed by [name], MD on 05/27/2012 Cancer stage: pT2pN0cM0  Colon cancer [facility] Staging form: Colon and Rectum, AJCC 8th Edition - Pathologic stage from 03/06/2022: Stage IIA (pT3, pN0, cM0) - Signed by [name], NP on 04/14/2022 Stage prefix: Initial diagnosis Total positive nodes: 0 Histologic grading system: 4 grade system Histologic grade (G): G2     Colon cancer (HCC)  12/19/2021 Procedure   Colonoscopy impression, by [name] - Likely malignant tumor in the cecum. Biopsied. - One small polyp in the distal transverse colon. Biopsied. - Five small polyps in the sigmoid colon and in the descending colon. - One 5 to 10 mm polyp at the splenic flexure, removed with a cold snare. Resected and retrieved. - One 7 mm polyp in the mid sigmoid colon, removed with a cold snare. Resected and retrieved. - Diverticulosis in the sigmoid colon. - External hemorrhoids.   12/19/2021 Initial Biopsy   FINAL MICROSCOPIC DIAGNOSIS:  A. COLON, MASS, BIOPSY:  Invasive moderately differentiated adenocarcinoma  Tumor arises within ulcerated tubular adenoma with high-grade dysplasia  B. COLON, TRANSVERSE, DESCENDING, SIGMOID, POLYPECTOMY:  Tubular adenoma  Negative for high-grade dysplasia and carcinoma  C.  COLON, SPLENIC FLEXURE POLYP:  Tubular adenoma  Negative for high-grade dysplasia and carcinoma  D. COLON, DISTAL SIGMOID, POLYPECTOMY:  Tubular adenoma  Negative for high-grade dysplasia and carcinoma    12/30/2021 Imaging   CT AP IMPRESSION: No acute findings. No evidence of abdominal or pelvic metastatic  disease.  Prior prostatectomy. Aortic Atherosclerosis (ICD10-I70.0).   01/30/2022 Imaging   CT chest  IMPRESSION: 1. No evidence of pulmonary metastasis. 2. Coronary artery calcification and Aortic Atherosclerosis (ICD10-I70.0).     02/27/2022 Tumor Marker   CEA: 44.1   03/06/2022 Initial Diagnosis   Colon cancer (HCC)   03/06/2022 Cancer Staging   Staging form: Colon and Rectum, AJCC 8th Edition - Pathologic stage from 03/06/2022: Stage IIA (pT3, pN0, cM0) - Signed by [name], NP on 04/14/2022 Stage prefix: Initial diagnosis Total positive nodes: 0 Histologic grading system: 4 grade system Histologic grade (G): G2   03/06/2022 Surgery   PROCEDURE:  XI ROBOT ASSISTED PARTIAL RIGHT COLECTOMY   03/06/2022 Pathology Results   FINAL MICROSCOPIC DIAGNOSIS:  A. COLON, RIGHT, RESECTION:  - Invasive moderately differentiated adenocarcinoma, 3.5 cm, involving  cecum and proximal ascending colon  - Carcinoma invades into pericolonic soft tissue  - Resection margins are negative for carcinoma  - Seventeen benign lymph nodes (0/17)  - Two separate tubular adenomas  - Unremarkable appendiceal stump  Procedure: Resection, right colon  Tumor Site: Cecum and proximal ascending colon  Tumor Size: 3.5 cm  Macroscopic Tumor Perforation: Not identified  Histologic Type: Adenocarcinoma  Histologic Grade: G2: Moderately differentiated  Multiple Primary Sites: Not applicable  Tumor Extension: Carcinoma invades into pericolonic soft tissue  Lymphovascular Invasion: Not identified  Perineural Invasion: Not identified  Treatment Effect: No known presurgical therapy  Margins:       Margin Status for Invasive Carcinoma: All margins negative for invasive carcinoma       Margin Status for Non-Invasive Tumor: All margins negative for high-grade dysplasia / intramucosal carcinoma and low-grade dysplasia  Regional Lymph Nodes:       Number of Lymph Nodes with Tumor: 0       Number of Lymph Nodes Examined: 17  Tumor Deposits: Not identified  Distant Metastasis:       Distant Site(s) Involved: Not  applicable  Pathologic Stage Classification (pTNM, AJCC 8th Edition): pT3, pN0   MMR normal, MSI-stable   02/20/2023 Imaging    IMPRESSION: 1. No evidence of metastatic disease in the chest, abdomen, or pelvis. 2. Aortic Atherosclerosis (ICD10-I70.0) and Emphysema (ICD10-J43.9).        Discussed the use of AI scribe software for clinical note transcription with the patient, who gave verbal consent to proceed.  History of Present Illness   The patient, a 77 year old male with a history of colon cancer, presents for a routine follow-up. He reports no new symptoms or changes in his health status. He denies any abdominal discomfort, bloating, or changes in bowel habits. He has regular bowel movements and denies any rectal bleeding. His energy level remains stable, and he has gained a few pounds since his last visit. He reports some limitations in physical activity due to age but is generally able to do most things he wants to do. He has been experiencing some neck and shoulder pain for the past couple of months, which he describes as tolerable. He denies any radiation of the pain down his arms. He has been managing the pain with over-the-counter Tylenol for arthritis. He also reports a bulge in his abdomen, which he believes to be a hernia, although his primary care physician has suggested it may be due to pulled muscles.         All other systems were reviewed with the patient and are negative.  MEDICAL HISTORY:  Past Medical History:  Diagnosis Date  Arthritis    ESP IN HANDS   Cancer (HCC)    OCCULAR RIGHT EYE--SURGERY AND CHEMO  PT IS LEGALLY BLIND IN RT EYE   Cataract    LEFT EYE   Chronic kidney disease    Colon cancer (HCC)    COPD (chronic obstructive pulmonary disease) (HCC)    FORMER SMOKER   Erectile dysfunction    GERD (gastroesophageal reflux disease)    Glaucoma    ONLY IN RIGHT EYE   Hypertension    Lung cancer (HCC)    lung ca dx 02/2010   Paresthesia and pain  of right extremity    RIGHT SHOULDER    Pneumonia 2011   Prostate cancer (HCC)    prostate ca dx 11/11   Shortness of breath    ONLY WITH EXERTION; HX OF LOBECTOMY FOR CANER   Sleep apnea     SURGICAL HISTORY: Past Surgical History:  Procedure Laterality Date   APPENDECTOMY  02/1999   BACK SURGERY     BIOPSY  12/19/2021   Procedure: BIOPSY;  Surgeon: Malissa Hippo, MD;  [facility];  Service: Endoscopy;;   CARDIAC CATHETERIZATION  2008   indication was due to LOC, chest tightness, diaphoreses; seen at [facility]; PER PATIENT , NORMAL ; no records in epic ; endoreses repeat intermittent sx since time of cath    COLONOSCOPY  08/06/2012   Procedure: COLONOSCOPY;  Surgeon: Malissa Hippo, MD;  [facility]: [facility];  Service: Endoscopy;  Laterality: N/A;  955   COLONOSCOPY N/A 08/08/2015   Procedure: COLONOSCOPY;  Surgeon: Malissa Hippo, MD;  [facility]: [facility];  Service: Endoscopy;  Laterality: N/A;  1200   COLONOSCOPY N/A 10/21/2018   Procedure: COLONOSCOPY;  Surgeon: Malissa Hippo, MD;  [facility]: [facility];  Service: Endoscopy;  Laterality: N/A;  1030   COLONOSCOPY WITH PROPOFOL N/A 12/19/2021   Procedure: COLONOSCOPY WITH PROPOFOL;  Surgeon: Malissa Hippo, MD;  [facility];  Service: Endoscopy;  Laterality: N/A;  1225   COLONOSCOPY WITH PROPOFOL N/A 03/10/2023   Procedure: COLONOSCOPY WITH PROPOFOL;  Surgeon: Dolores Frame, MD;  [facility]: [facility];  Service: Gastroenterology;  Laterality: N/A;  10:30am;asa 3   ELBOW SURGERY     EYE SURGERY     OCT 1999 DETACHED RETINA -RIGHT; MAY AND JUNE 2009 RIGHT EYE SURGERY FOR CANCER-LENS HAS BEEN REMOVED.   LEFT UPPER LOBECTOMY  03/2010   FOR CANCER   PENILE PROSTHESIS IMPLANT  08/10/2012   Procedure: PENILE PROTHESIS INFLATABLE;  Surgeon: Anner Crete, MD;  Location: [facility];  Service: Urology;  Laterality: N/A;  IMPLANT 3 PIECE PENILE PROSTHESIS    POLYPECTOMY  10/21/2018    Procedure: POLYPECTOMY;  Surgeon: Malissa Hippo, MD;  [facility];  Service: Endoscopy;;  colon   POLYPECTOMY  12/19/2021   Procedure: POLYPECTOMY;  Surgeon: Malissa Hippo, MD;  [facility];  Service: Endoscopy;;   POLYPECTOMY  03/10/2023   Procedure: POLYPECTOMY;  Surgeon: Dolores Frame, MD;  [facility];  Service: Gastroenterology;;   PROSTATECTOMY  08/2010   FOR CANCER   SUBMUCOSAL LIFTING INJECTION  03/10/2023   Procedure: SUBMUCOSAL LIFTING INJECTION;  Surgeon: Dolores Frame, MD;  [facility];  Service: Gastroenterology;;   TOTAL KNEE ARTHROPLASTY Left 09/10/2017   Procedure: LEFT TOTAL KNEE ARTHROPLASTY;  Surgeon: Jene Every, MD;  Location: [facility];  Service: Orthopedics;  Laterality: Left;  120 mins    I have reviewed the social history and family history with the patient and they are unchanged from previous note.  ALLERGIES:  is allergic to silicone.  MEDICATIONS:  Current Outpatient Medications  Medication Sig Dispense Refill   acetaminophen (TYLENOL) 650 MG CR tablet Take 1,300 mg by mouth every morning.     amLODipine (NORVASC) 5 MG tablet Take 5 mg by mouth daily.     aspirin EC 81 MG tablet Take 1 tablet (81 mg total) by mouth daily. 30 tablet 11   atorvastatin (LIPITOR) 20 MG tablet Take 20 mg by mouth daily. Takes twice a week     chlorthalidone (HYGROTON) 25 MG tablet Take 25 mg by mouth daily.      Cholecalciferol (VITAMIN D) 50 MCG (2000 UT) tablet Take 2,000 Units by mouth daily.     losartan (COZAAR) 100 MG tablet Take 100 mg by mouth daily.     Omeprazole 20 MG TBEC Take 20 mg by mouth every other day.     triamcinolone cream (KENALOG) 0.1 % Apply 1 application  topically daily as needed for rash.     No current facility-administered medications for this visit.    PHYSICAL EXAMINATION: ECOG PERFORMANCE STATUS: 0 - Asymptomatic  Vitals:   06/22/23 1345  BP: 137/73  Pulse: (!) 57   Resp: 18  Temp: 97.8 F (36.6 C)  SpO2: 98%   Wt Readings from Last 3 Encounters:  06/22/23 182 lb 8 oz (82.8 kg)  03/05/23 178 lb 6.4 oz (80.9 kg)  02/23/23 178 lb 6.4 oz (80.9 kg)     GENERAL:alert, no distress and comfortable SKIN: skin color, texture, turgor are normal, no rashes or significant lesions EYES: normal, Conjunctiva are pink and non-injected, sclera clear NECK: supple, thyroid normal size, non-tender, without nodularity LYMPH:  no palpable lymphadenopathy in the cervical, axillary  LUNGS: clear to auscultation and percussion with normal breathing effort HEART: regular rate & rhythm and no murmurs and no lower extremity edema ABDOMEN:abdomen soft, non-tender and normal bowel sounds Musculoskeletal:no cyanosis of digits and no clubbing  NEURO: alert & oriented x 3 with fluent speech, no focal motor/sensory deficits   LABORATORY DATA:  I have reviewed the data as listed    Latest Ref Rng & Units 06/22/2023    1:20 PM 02/23/2023    2:00 PM 10/30/2022   10:13 AM  CBC  WBC 4.0 - 10.5 K/uL 6.5  7.0  7.8   Hemoglobin 13.0 - 17.0 g/dL 16.1  09.6  04.5   Hematocrit 39.0 - 52.0 % 36.3  38.4  38.8   Platelets 150 - 400 K/uL 247  258  298         Latest Ref Rng & Units 06/22/2023    1:20 PM 03/05/2023   11:10 AM 02/23/2023    2:00 PM  CMP  Glucose 70 - 99 mg/dL 91  409  811   BUN 8 - 23 mg/dL 36  28  39   Creatinine 0.61 - 1.24 mg/dL 9.14  7.82  9.56   Sodium 135 - 145 mmol/L 140  136  140   Potassium 3.5 - 5.1 mmol/L 4.3  3.9  4.5   Chloride 98 - 111 mmol/L 108  103  106   CO2 22 - 32 mmol/L 24  23  25    Calcium 8.9 - 10.3 mg/dL 9.3  8.7  9.4   Total Protein 6.5 - 8.1 g/dL 7.5   8.0  Total Bilirubin 0.3 - 1.2 mg/dL 0.5   0.4   Alkaline Phos 38 - 126 U/L 66   69   AST 15 - 41 U/L 14   15   ALT 0 - 44 U/L 14   14       RADIOGRAPHIC STUDIES: I have personally reviewed the radiological images as listed and agreed with the findings in the report. No results  found.    No orders of the defined types were placed in this encounter.  All questions were answered. The patient knows to call the clinic with any problems, questions or concerns. No barriers to learning was detected. The total time spent in the appointment was 20 minutes.     [name], MD 06/22/2023

## 2023-06-29 ENCOUNTER — Other Ambulatory Visit: Payer: Self-pay

## 2023-06-30 LAB — SURGICAL PATHOLOGY

## 2023-10-01 DIAGNOSIS — N5231 Erectile dysfunction following radical prostatectomy: Secondary | ICD-10-CM | POA: Diagnosis not present

## 2023-10-01 DIAGNOSIS — Z8546 Personal history of malignant neoplasm of prostate: Secondary | ICD-10-CM | POA: Diagnosis not present

## 2023-10-13 DIAGNOSIS — Z79899 Other long term (current) drug therapy: Secondary | ICD-10-CM | POA: Diagnosis not present

## 2023-10-13 DIAGNOSIS — E785 Hyperlipidemia, unspecified: Secondary | ICD-10-CM | POA: Diagnosis not present

## 2023-10-13 DIAGNOSIS — C61 Malignant neoplasm of prostate: Secondary | ICD-10-CM | POA: Diagnosis not present

## 2023-10-13 DIAGNOSIS — I700 Atherosclerosis of aorta: Secondary | ICD-10-CM | POA: Diagnosis not present

## 2023-10-18 ENCOUNTER — Other Ambulatory Visit: Payer: Self-pay | Admitting: Nurse Practitioner

## 2023-10-18 DIAGNOSIS — C182 Malignant neoplasm of ascending colon: Secondary | ICD-10-CM

## 2023-10-18 NOTE — Assessment & Plan Note (Signed)
p(T3, N0) cM0 stage IIa, MSS -found to have a 3 cm cecal mass on routine colonoscopy on 12/19/21 by [name]. Biopsy revealed moderately differentiated adenocarcinoma -Staging CT AP 12/30/21 and chest 01/30/22 were both negative for metastatic disease. -baseline CEA 02/27/22 elevated to 44 -s/p right hemicolectomy by [name] 03/06/22, path 3.5 cm mass invading into pericolonic soft tissue. Margins and lymph nodes negative. MMR normal, MSI stable -postoperative CEA dropped to WNL. -Guardant Reveal from 05/22/22 and 07/2022 were negative. -surveillance CT scan 02/20/2023 showed NED -Surveillance CT CAP should be ordered and scheduled for June 2025. -Surveillance colonoscopy should be scheduled for June 2027. -Routine follow-up with labs should be done every 4 months.

## 2023-10-18 NOTE — Progress Notes (Unsigned)
Patient Care Team: Carylon Perches, MD as PCP - General (Internal Medicine) Malissa Hippo, MD (Inactive) as Consulting Physician (Gastroenterology) [name], MD as Consulting Physician (General Surgery) [name], MD as Consulting Physician (Hematology) [name], NP as Nurse Practitioner (Nurse Practitioner)  Clinic Day:  10/19/2023  Referring physician: Carylon Perches, MD  ASSESSMENT & PLAN:   Assessment & Plan: Colon cancer (HCC) p(T3, N0) cM0 stage IIa, MSS -found to have a 3 cm cecal mass on routine colonoscopy on 12/19/21 by [name]. Biopsy revealed moderately differentiated adenocarcinoma -Staging CT AP 12/30/21 and chest 01/30/22 were both negative for metastatic disease. -baseline CEA 02/27/22 elevated to 44 -s/p right hemicolectomy by [name] 03/06/22, path 3.5 cm mass invading into pericolonic soft tissue. Margins and lymph nodes negative. MMR normal, MSI stable -postoperative CEA dropped to WNL. -Guardant Reveal from 05/22/22 and 07/2022 were negative. -surveillance CT scan 02/20/2023 showed NED -Surveillance CT CAP should be ordered and scheduled for June 2025. -Surveillance colonoscopy should be scheduled for June 2027. -Routine follow-up with labs should be done every 4 months.    Plan: Labs reviewed  -CBC showing WBC 7.4; Hgb 13.2; Hct 37.2; Plt 292; Anc 4.2 -CMP - K 4.2; glucose 106; BUN 26; Creatinine 1.41; eGFR 52; Ca 9.1; LFTs normal.   -CEA -pending -Surveillance CT CAP ordered today.  Should be done towards the end of June 2025. -Surveillance colonoscopy done 03/10/2023.  Recall expected in 3 years as 8 mm polyp in cecum was inflammatory only. -Labs with follow-up in 4 months.  The patient understands the plans discussed today and is in agreement with them.  He knows to contact our office if he develops concerns prior to his next appointment.  I provided 20 minutes of face-to-face time during this encounter and > 50% was spent counseling as documented  under my assessment and plan.    Carlean Jews, NP  [facility] - A DEPT OF [facility] [address] [place] [address] Dept: [phone] Dept Fax: [phone]   Orders Placed This Encounter  Procedures   CT CHEST ABDOMEN PELVIS W CONTRAST    Standing Status:   Future    Expected Date:   03/07/2024    Expiration Date:   10/18/2024    If indicated for the ordered procedure, I authorize the administration of contrast media per Radiology protocol:   Yes    Does the patient have a contrast media/X-ray dye allergy?:   No    Preferred imaging location?:   [facility]    If indicated for the ordered procedure, I authorize the administration of oral contrast media per Radiology protocol:   Yes      CHIEF COMPLAINT:  CC: Malignant neoplasm of ascending colon  Current Treatment: Surveillance  INTERVAL HISTORY:  [name] Short is here today for repeat clinical assessment.  He was last seen by Dr. Mosetta Putt on 06/22/2023.  Next surveillance CT CAP should be scheduled for June 2025.  This was ordered today.  Should continue with cancer surveillance follow-ups every 4 months.  Next surveillance colonoscopy should be June 2027.  Patient reports no new problems or concerns related to cancer.  He denies nausea and vomiting.  He denies diarrhea or constipation.  He denies presence of blood in the stool.  He denies new evidence of heartburn.  Not taking acid reflux medicine every other day. He denies chest pain, chest pressure, or  shortness of breath. He denies headaches or visual disturbances. He denies abdominal pain, nausea, vomiting, or changes in bowel or bladder habits. He denies fevers or chills. He denies pain. His appetite is good. His weight has been stable.  I have reviewed the past medical history, past surgical history, social history and family history with the patient and they are unchanged from previous note.  ALLERGIES:   is allergic to silicone.  MEDICATIONS:  Current Outpatient Medications  Medication Sig Dispense Refill   acetaminophen (TYLENOL) 650 MG CR tablet Take 1,300 mg by mouth every morning.     amLODipine (NORVASC) 5 MG tablet Take 5 mg by mouth daily.     aspirin EC 81 MG tablet Take 1 tablet (81 mg total) by mouth daily. 30 tablet 11   atorvastatin (LIPITOR) 20 MG tablet Take 20 mg by mouth daily. Takes twice a week     chlorthalidone (HYGROTON) 25 MG tablet Take 25 mg by mouth 2 (two) times a week. Per pt, takes only twice a week.     Cholecalciferol (VITAMIN D) 50 MCG (2000 UT) tablet Take 2,000 Units by mouth daily.     losartan (COZAAR) 100 MG tablet Take 100 mg by mouth daily.     Omeprazole 20 MG TBEC Take 20 mg by mouth every other day.     triamcinolone cream (KENALOG) 0.1 % Apply 1 application  topically daily as needed for rash.     No current facility-administered medications for this visit.    HISTORY OF PRESENT ILLNESS:   Oncology History Overview Note   Cancer Staging  Cancer of left lung [facility]) Staging form: Lung, AJCC 7th Edition - Pathologic: pT2pN0cM0 - Signed by [name], MD on 05/27/2012 Cancer stage: pT2pN0cM0  Colon cancer [facility]) Staging form: Colon and Rectum, AJCC 8th Edition - Pathologic stage from 03/06/2022: Stage IIA (pT3, pN0, cM0) - Signed by [name], NP on 04/14/2022 Stage prefix: Initial diagnosis Total positive nodes: 0 Histologic grading system: 4 grade system Histologic grade (G): G2     Colon cancer (HCC)  12/19/2021 Procedure   Colonoscopy impression, by [name] - Likely malignant tumor in the cecum. Biopsied. - One small polyp in the distal transverse colon. Biopsied. - Five small polyps in the sigmoid colon and in the descending colon. - One 5 to 10 mm polyp at the splenic flexure, removed with a cold snare. Resected and retrieved. - One 7 mm polyp in the mid sigmoid colon, removed with a cold snare. Resected  and retrieved. - Diverticulosis in the sigmoid colon. - External hemorrhoids.   12/19/2021 Initial Biopsy   FINAL MICROSCOPIC DIAGNOSIS:  A. COLON, MASS, BIOPSY:  Invasive moderately differentiated adenocarcinoma  Tumor arises within ulcerated tubular adenoma with high-grade dysplasia  B. COLON, TRANSVERSE, DESCENDING, SIGMOID, POLYPECTOMY:  Tubular adenoma  Negative for high-grade dysplasia and carcinoma  C.  COLON, SPLENIC FLEXURE POLYP:  Tubular adenoma  Negative for high-grade dysplasia and carcinoma  D. COLON, DISTAL SIGMOID, POLYPECTOMY:  Tubular adenoma  Negative for high-grade dysplasia and carcinoma    12/30/2021 Imaging   CT AP IMPRESSION: No acute findings. No evidence of abdominal or pelvic metastatic disease.  Prior prostatectomy. Aortic Atherosclerosis (ICD10-I70.0).   01/30/2022 Imaging   CT chest IMPRESSION: 1. No evidence of pulmonary metastasis. 2. Coronary artery calcification and Aortic Atherosclerosis (ICD10-I70.0).     02/27/2022 Tumor Marker   CEA: 44.1   03/06/2022 Initial Diagnosis   Colon cancer (HCC)   03/06/2022 Cancer  Staging   Staging form: Colon and Rectum, AJCC 8th Edition - Pathologic stage from 03/06/2022: Stage IIA (pT3, pN0, cM0) - Signed by [name], NP on 04/14/2022 Stage prefix: Initial diagnosis Total positive nodes: 0 Histologic grading system: 4 grade system Histologic grade (G): G2   03/06/2022 Surgery   PROCEDURE:  XI ROBOT ASSISTED PARTIAL RIGHT COLECTOMY   03/06/2022 Pathology Results   FINAL MICROSCOPIC DIAGNOSIS:  A. COLON, RIGHT, RESECTION:  - Invasive moderately differentiated adenocarcinoma, 3.5 cm, involving  cecum and proximal ascending colon  - Carcinoma invades into pericolonic soft tissue  - Resection margins are negative for carcinoma  - Seventeen benign lymph nodes (0/17)  - Two separate tubular adenomas  - Unremarkable appendiceal stump  Procedure: Resection, right colon  Tumor Site: Cecum and proximal  ascending colon  Tumor Size: 3.5 cm  Macroscopic Tumor Perforation: Not identified  Histologic Type: Adenocarcinoma  Histologic Grade: G2: Moderately differentiated  Multiple Primary Sites: Not applicable  Tumor Extension: Carcinoma invades into pericolonic soft tissue  Lymphovascular Invasion: Not identified  Perineural Invasion: Not identified  Treatment Effect: No known presurgical therapy  Margins:       Margin Status for Invasive Carcinoma: All margins negative for invasive carcinoma       Margin Status for Non-Invasive Tumor: All margins negative for high-grade dysplasia / intramucosal carcinoma and low-grade dysplasia  Regional Lymph Nodes:       Number of Lymph Nodes with Tumor: 0       Number of Lymph Nodes Examined: 17  Tumor Deposits: Not identified  Distant Metastasis:       Distant Site(s) Involved: Not applicable  Pathologic Stage Classification (pTNM, AJCC 8th Edition): pT3, pN0   MMR normal, MSI-stable   02/20/2023 Imaging    IMPRESSION: 1. No evidence of metastatic disease in the chest, abdomen, or pelvis. 2. Aortic Atherosclerosis (ICD10-I70.0) and Emphysema (ICD10-J43.9).     03/10/2023 Procedure   Colonoscopy Impression: Examined ileum normal Patent end-to-end anastomosis characterized by healthy colonic mucosa. 8 mm polyp in the cecum.  Pathology showed inflammatory polyp only. 4.  Two, 3 to 4 mm polyps in the descending colon 5.  Diverticulosis 6.  Nonbleeding, internal hemorrhoids       REVIEW OF SYSTEMS:   Constitutional: Denies fevers, chills or abnormal weight loss Eyes: Denies blurriness of vision Ears, nose, mouth, throat, and face: Denies mucositis or sore throat Respiratory: Denies cough, dyspnea or wheezes Cardiovascular: Denies palpitation, chest discomfort or lower extremity swelling Gastrointestinal:  Denies nausea, heartburn or change in bowel habits Skin: Denies abnormal skin rashes Lymphatics: Denies new lymphadenopathy or easy  bruising Neurological:Denies numbness, tingling or new weaknesses Behavioral/Psych: Mood is stable, no new changes  All other systems were reviewed with the patient and are negative.   VITALS:   Today's Vitals   10/19/23 1318 10/19/23 1330  BP: 119/63   Pulse: 66   Resp: 16   Temp: 97.6 F (36.4 C)   TempSrc: Temporal   SpO2: 99%   Weight: 181 lb 9.6 oz (82.4 kg)   Height: 5\' 8"  (1.727 m)   PainSc:  0-No pain   Body mass index is 27.61 kg/m.   Wt Readings from Last 3 Encounters:  10/19/23 181 lb 9.6 oz (82.4 kg)  06/22/23 182 lb 8 oz (82.8 kg)  03/05/23 178 lb 6.4 oz (80.9 kg)    Body mass index is 27.61 kg/m.  Performance status (ECOG): 0 - Asymptomatic  PHYSICAL EXAM:   GENERAL:alert, no  distress and comfortable SKIN: skin color, texture, turgor are normal, no rashes or significant lesions EYES: normal, Conjunctiva are pink and non-injected, sclera clear OROPHARYNX:no exudate, no erythema and lips, buccal mucosa, and tongue normal  NECK: supple, thyroid normal size, non-tender, without nodularity LYMPH:  no palpable lymphadenopathy in the cervical, axillary or inguinal LUNGS: clear to auscultation and percussion with normal breathing effort HEART: regular rate & rhythm and no murmurs and no lower extremity edema ABDOMEN:abdomen soft, non-tender and normal bowel sounds Musculoskeletal:no cyanosis of digits and no clubbing  NEURO: alert & oriented x 3 with fluent speech, no focal motor/sensory deficits  LABORATORY DATA:  I have reviewed the data as listed    Component Value Date/Time   NA 138 10/19/2023 1254   NA 138 01/30/2016 1144   K 4.2 10/19/2023 1254   K 4.0 01/30/2016 1144   CL 105 10/19/2023 1254   CL 108 (H) 02/01/2013 0758   CO2 23 10/19/2023 1254   CO2 22 01/30/2016 1144   GLUCOSE 106 (H) 10/19/2023 1254   GLUCOSE 91 01/30/2016 1144   GLUCOSE 106 (H) 02/01/2013 0758   BUN 26 (H) 10/19/2023 1254   BUN 32.7 (H) 01/30/2016 1144   CREATININE  1.41 (H) 10/19/2023 1254   CREATININE 1.4 (H) 01/30/2016 1144   CALCIUM 9.1 10/19/2023 1254   CALCIUM 9.1 01/30/2016 1144   PROT 7.4 10/19/2023 1254   PROT 7.7 01/30/2016 1144   ALBUMIN 4.4 10/19/2023 1254   ALBUMIN 4.2 01/30/2016 1144   AST 20 10/19/2023 1254   AST 17 01/30/2016 1144   ALT 21 10/19/2023 1254   ALT 21 01/30/2016 1144   ALKPHOS 68 10/19/2023 1254   ALKPHOS 75 01/30/2016 1144   BILITOT 0.6 10/19/2023 1254   BILITOT 0.42 01/30/2016 1144   GFRNONAA 52 (L) 10/19/2023 1254   GFRAA 55 (L) 11/22/2018 2050     Lab Results  Component Value Date   WBC 7.4 10/19/2023   NEUTROABS 4.2 10/19/2023   HGB 13.2 10/19/2023   HCT 37.2 (L) 10/19/2023   MCV 89.9 10/19/2023   PLT 292 10/19/2023

## 2023-10-19 ENCOUNTER — Encounter: Payer: Self-pay | Admitting: Nurse Practitioner

## 2023-10-19 ENCOUNTER — Inpatient Hospital Stay (HOSPITAL_BASED_OUTPATIENT_CLINIC_OR_DEPARTMENT_OTHER): Payer: Self-pay | Admitting: Nurse Practitioner

## 2023-10-19 ENCOUNTER — Inpatient Hospital Stay: Payer: HMO | Attending: Nurse Practitioner

## 2023-10-19 VITALS — BP 119/63 | HR 66 | Temp 97.6°F | Resp 16 | Ht 68.0 in | Wt 181.6 lb

## 2023-10-19 DIAGNOSIS — Z79899 Other long term (current) drug therapy: Secondary | ICD-10-CM | POA: Insufficient documentation

## 2023-10-19 DIAGNOSIS — C182 Malignant neoplasm of ascending colon: Secondary | ICD-10-CM

## 2023-10-19 LAB — CBC WITH DIFFERENTIAL (CANCER CENTER ONLY)
Abs Immature Granulocytes: 0.02 10*3/uL (ref 0.00–0.07)
Basophils Absolute: 0.1 10*3/uL (ref 0.0–0.1)
Basophils Relative: 1 %
Eosinophils Absolute: 0.2 10*3/uL (ref 0.0–0.5)
Eosinophils Relative: 2 %
HCT: 37.2 % — ABNORMAL LOW (ref 39.0–52.0)
Hemoglobin: 13.2 g/dL (ref 13.0–17.0)
Immature Granulocytes: 0 %
Lymphocytes Relative: 31 %
Lymphs Abs: 2.2 10*3/uL (ref 0.7–4.0)
MCH: 31.9 pg (ref 26.0–34.0)
MCHC: 35.5 g/dL (ref 30.0–36.0)
MCV: 89.9 fL (ref 80.0–100.0)
Monocytes Absolute: 0.7 10*3/uL (ref 0.1–1.0)
Monocytes Relative: 9 %
Neutro Abs: 4.2 10*3/uL (ref 1.7–7.7)
Neutrophils Relative %: 57 %
Platelet Count: 292 10*3/uL (ref 150–400)
RBC: 4.14 MIL/uL — ABNORMAL LOW (ref 4.22–5.81)
RDW: 13.3 % (ref 11.5–15.5)
WBC Count: 7.4 10*3/uL (ref 4.0–10.5)
nRBC: 0 % (ref 0.0–0.2)

## 2023-10-19 LAB — CMP (CANCER CENTER ONLY)
ALT: 21 U/L (ref 0–44)
AST: 20 U/L (ref 15–41)
Albumin: 4.4 g/dL (ref 3.5–5.0)
Alkaline Phosphatase: 68 U/L (ref 38–126)
Anion gap: 10 (ref 5–15)
BUN: 26 mg/dL — ABNORMAL HIGH (ref 8–23)
CO2: 23 mmol/L (ref 22–32)
Calcium: 9.1 mg/dL (ref 8.9–10.3)
Chloride: 105 mmol/L (ref 98–111)
Creatinine: 1.41 mg/dL — ABNORMAL HIGH (ref 0.61–1.24)
GFR, Estimated: 52 mL/min — ABNORMAL LOW (ref >60)
Glucose, Bld: 106 mg/dL — ABNORMAL HIGH (ref 70–99)
Potassium: 4.2 mmol/L (ref 3.5–5.1)
Sodium: 138 mmol/L (ref 135–145)
Total Bilirubin: 0.6 mg/dL (ref 0.0–1.2)
Total Protein: 7.4 g/dL (ref 6.5–8.1)

## 2023-10-19 LAB — CEA (ACCESS): CEA (CHCC): 1.94 ng/mL (ref 0.00–5.00)

## 2023-10-20 DIAGNOSIS — I10 Essential (primary) hypertension: Secondary | ICD-10-CM | POA: Diagnosis not present

## 2023-10-20 DIAGNOSIS — N1831 Chronic kidney disease, stage 3a: Secondary | ICD-10-CM | POA: Diagnosis not present

## 2023-10-20 DIAGNOSIS — M25511 Pain in right shoulder: Secondary | ICD-10-CM | POA: Diagnosis not present

## 2023-10-26 DIAGNOSIS — M25511 Pain in right shoulder: Secondary | ICD-10-CM | POA: Diagnosis not present

## 2023-10-26 DIAGNOSIS — M25512 Pain in left shoulder: Secondary | ICD-10-CM | POA: Insufficient documentation

## 2023-12-09 DIAGNOSIS — M25511 Pain in right shoulder: Secondary | ICD-10-CM | POA: Diagnosis not present

## 2023-12-09 DIAGNOSIS — M47812 Spondylosis without myelopathy or radiculopathy, cervical region: Secondary | ICD-10-CM | POA: Diagnosis not present

## 2024-01-18 ENCOUNTER — Ambulatory Visit (INDEPENDENT_AMBULATORY_CARE_PROVIDER_SITE_OTHER): Payer: Medicare HMO | Admitting: Gastroenterology

## 2024-01-18 ENCOUNTER — Encounter (INDEPENDENT_AMBULATORY_CARE_PROVIDER_SITE_OTHER): Payer: Self-pay | Admitting: Gastroenterology

## 2024-01-18 VITALS — BP 131/66 | HR 67 | Temp 97.8°F | Ht 66.5 in | Wt 183.7 lb

## 2024-01-18 DIAGNOSIS — Z85038 Personal history of other malignant neoplasm of large intestine: Secondary | ICD-10-CM

## 2024-01-18 DIAGNOSIS — Z860101 Personal history of adenomatous and serrated colon polyps: Secondary | ICD-10-CM | POA: Diagnosis not present

## 2024-01-18 DIAGNOSIS — Z87891 Personal history of nicotine dependence: Secondary | ICD-10-CM

## 2024-01-18 DIAGNOSIS — Z08 Encounter for follow-up examination after completed treatment for malignant neoplasm: Secondary | ICD-10-CM

## 2024-01-18 NOTE — Patient Instructions (Signed)
 I'm glad you are doing well! We will plan to see you as needed in the clinic, follow up for colonoscopy in 3 years, we will reach out to you closer to time to schedule this

## 2024-01-18 NOTE — Progress Notes (Addendum)
 Referring Provider: Artemisa Bile, MD Primary Care Physician:  Artemisa Bile, MD Primary GI Physician: Dr. Sammi Crick   Chief Complaint  Patient presents with   Follow-up    Pt arrives for follow up. No questions/concerns.    HPI:   [name] Short is a 78 y.o. male with past medical history of arthritis, colon cancer, COPD, ED, GERD, glaucoma, HTN, lunch cancer, Prostate, sleep apnea (does not wears CPAP)   Patient presenting today for:  Follow up for history of cecal adenocarcinoma  Last seen may 2024, at that time denied any GI issues. Recently advised to have updated colonoscopy by his oncologist.  Recommended to proceed with colonoscopy   Present: Doing well today, no GI complaints. Has follow up with Dr. Maryalice Smaller in June. Having a BM usually daily.  No red flag symptoms. Patient denies melena, hematochezia, nausea, vomiting, diarrhea, constipation, dysphagia, odyonophagia, early satiety or weight loss.    Pertinent History:  p(T3, N0) cM0 stage IIa, MSS - 3 cm cecal mass on routine colonoscopy on 12/19/21 by Dr. Homero Luster.  Biopsy revealed moderately differentiated adenocarcinoma -Staging CT AP 12/30/21 and chest 01/30/22 both negative for metastatic disease. -baseline CEA 02/27/22 elevated to 44 -s/p right hemicolectomy by Dr. Andy Bannister 03/06/22, path 3.5 cm mass invading into pericolonic soft tissue. Margins and lymph nodes negative. MMR normal, MSI stable -postoperative CEA dropped to WNL, last in feb 2025 was 1.94 -Guardant Reveal from 05/22/22 and 07/2022 were negative. -surveillance CT scan 02/20/2023 showed no metastatic disease, repeat due June 2025  Last Colonoscopy:02/2023 - The examined portion of the ileum was normal.                           - Patent side-to-side ileo-colonic anastomosis,                            characterized by healthy appearing mucosa.                           - One 8 mm polyp in the cecum, removed with a cold                            snare. Resected and  retrieved via EMR. ?Possible                            granulation tissue.                           - Two 3 to 4 mm polyps in the descending colon and                            in the transverse colon, removed with a cold snare.                            Resected and retrieved.                           - Diverticulosis in the entire examined colon.                           -  Non-bleeding internal hemorrhoids.   A. COLON, CECUM, POLYPECTOMY:  - Inflammatory polyp  - Negative for dysplasia   B. COLON, TRANSVERSE, POLYPECTOMY:  - Tubular adenoma  - Negative for high-grade dysplasia or malignancy   C. COLON, DESCENDIND, POLYPECTOMY:  - Food material only  - Colonic mucosa is not identified   Repeat Colonoscopy 3 years   Past Medical History:  Diagnosis Date   Arthritis    ESP IN HANDS   Cancer (HCC)    OCCULAR RIGHT EYE--SURGERY AND CHEMO  PT IS LEGALLY BLIND IN RT EYE   Cataract    LEFT EYE   Chronic kidney disease    Colon cancer (HCC)    COPD (chronic obstructive pulmonary disease) (HCC)    FORMER SMOKER   Erectile dysfunction    GERD (gastroesophageal reflux disease)    Glaucoma    ONLY IN RIGHT EYE   Hypertension    Lung cancer (HCC)    lung ca dx 02/2010   Paresthesia and pain of right extremity    RIGHT SHOULDER    Pneumonia 2011   Prostate cancer (HCC)    prostate ca dx 11/11   Shortness of breath    ONLY WITH EXERTION; HX OF LOBECTOMY FOR CANER   Sleep apnea     Past Surgical History:  Procedure Laterality Date   APPENDECTOMY  02/1999   BACK SURGERY     BIOPSY  12/19/2021   Procedure: BIOPSY;  Surgeon: Ruby Corporal, MD;  [facility];  Service: Endoscopy;;   CARDIAC CATHETERIZATION  2008   indication was due to LOC, chest tightness, diaphoreses; seen at [facility]; PER PATIENT , NORMAL ; no records in epic ; endoreses repeat intermittent sx since time of cath    COLONOSCOPY  08/06/2012   Procedure: COLONOSCOPY;  Surgeon:  Ruby Corporal, MD;  [facility]: [facility];  Service: Endoscopy;  Laterality: N/A;  955   COLONOSCOPY N/A 08/08/2015   Procedure: COLONOSCOPY;  Surgeon: Ruby Corporal, MD;  [facility];  Service: Endoscopy;  Laterality: N/A;  1200   COLONOSCOPY N/A 10/21/2018   Procedure: COLONOSCOPY;  Surgeon: Ruby Corporal, MD;  [facility];  Service: Endoscopy;  Laterality: N/A;  1030   COLONOSCOPY WITH PROPOFOL  N/A 12/19/2021   Procedure: COLONOSCOPY WITH PROPOFOL ;  Surgeon: Ruby Corporal, MD;  [facility];  Service: Endoscopy;  Laterality: N/A;  1225   COLONOSCOPY WITH PROPOFOL  N/A 03/10/2023   Procedure: COLONOSCOPY WITH PROPOFOL ;  Surgeon: Urban Garden, MD;  Location: [facility];  Service: Gastroenterology;  Laterality: N/A;  10:30am;asa 3   ELBOW SURGERY     EYE SURGERY     OCT 1999 DETACHED RETINA -RIGHT; MAY AND JUNE 2009 RIGHT EYE SURGERY FOR CANCER-LENS HAS BEEN REMOVED.   LEFT UPPER LOBECTOMY  03/2010   FOR CANCER   PENILE PROSTHESIS IMPLANT  08/10/2012   Procedure: PENILE PROTHESIS INFLATABLE;  Surgeon: Willye Harvey, MD;  Location: [facility];  Service: Urology;  Laterality: N/A;  IMPLANT 3 PIECE PENILE PROSTHESIS    POLYPECTOMY  10/21/2018   Procedure: POLYPECTOMY;  Surgeon: Ruby Corporal, MD;  [facility];  Service: Endoscopy;;  colon   POLYPECTOMY  12/19/2021   Procedure: POLYPECTOMY;  Surgeon: Ruby Corporal, MD;  [facility];  Service: Endoscopy;;   POLYPECTOMY  03/10/2023   Procedure: POLYPECTOMY;  Surgeon: Urban Garden, MD;  [facility];  Service:  Gastroenterology;;   PROSTATECTOMY  08/2010   FOR CANCER   SUBMUCOSAL LIFTING INJECTION  03/10/2023   Procedure: SUBMUCOSAL LIFTING INJECTION;  Surgeon: Urban Garden, MD;  [facility];  Service: Gastroenterology;;   TOTAL KNEE ARTHROPLASTY Left 09/10/2017   Procedure: LEFT TOTAL KNEE ARTHROPLASTY;  Surgeon:  Orvan Blanch, MD;  Location: [facility];  Service: Orthopedics;  Laterality: Left;  120 mins    Current Outpatient Medications  Medication Sig Dispense Refill   acetaminophen  (TYLENOL ) 650 MG CR tablet Take 1,300 mg by mouth every morning.     amLODipine  (NORVASC ) 5 MG tablet Take 5 mg by mouth daily.     aspirin  EC 81 MG tablet Take 1 tablet (81 mg total) by mouth daily. 30 tablet 11   atorvastatin (LIPITOR) 20 MG tablet Take 20 mg by mouth daily. Takes twice a week     chlorthalidone  (HYGROTON ) 25 MG tablet Take 25 mg by mouth 2 (two) times a week. Per pt, takes only twice a week.     Cholecalciferol (VITAMIN D) 50 MCG (2000 UT) tablet Take 2,000 Units by mouth daily.     losartan  (COZAAR ) 100 MG tablet Take 100 mg by mouth daily.     Omeprazole 20 MG TBEC Take 20 mg by mouth every other day.     triamcinolone  cream (KENALOG ) 0.1 % Apply 1 application  topically daily as needed for rash.     No current facility-administered medications for this visit.    Allergies as of 01/18/2024 - Review Complete 01/18/2024  Allergen Reaction Noted   Silicone  10/21/2018    Social History   Socioeconomic History   Marital status: Married    Spouse name: Not on file   Number of children: 3   Years of education: Not on file   Highest education level: Not on file  Occupational History   Not on file  Tobacco Use   Smoking status: Former    Current packs/day: 0.00    Average packs/day: 1 pack/day for 45.0 years (45.0 ttl pk-yrs)    Types: Cigarettes    Start date: 09/08/1963    Quit date: 09/07/2008    Years since quitting: 15.3    Passive exposure: Past   Smokeless tobacco: Former    Types: Chew    Quit date: 10/08/1985  Vaping Use   Vaping status: Never Used  Substance and Sexual Activity   Alcohol use: Not Currently    Comment: Drink beer for 30 years, quit 1990s   Drug use: No   Sexual activity: Not on file  Other Topics Concern   Not on file  Social History Narrative   Not on  file   Social Drivers of Health   Financial Resource Strain: Not on file  Food Insecurity: Not on file  Transportation Needs: Not on file  Physical Activity: Not on file  Stress: Not on file  Social Connections: Not on file    Review of systems General: negative for malaise, night sweats, fever, chills, weight loss Neck: Negative for lumps, goiter, pain and significant neck swelling Resp: Negative for cough, wheezing, dyspnea at rest CV: Negative for chest pain, leg swelling, palpitations, orthopnea GI: denies melena, hematochezia, nausea, vomiting, diarrhea, constipation, dysphagia, odyonophagia, early satiety or unintentional weight loss.  The remainder of the review of systems is noncontributory.  Physical Exam: BP 131/66   Pulse 67   Temp 97.8 F (36.6 C)   Ht 5' 6.5" (1.689 m)   Wt 183  lb 11.2 oz (83.3 kg)   BMI 29.21 kg/m  General:   Alert and oriented. No distress noted. Pleasant and cooperative.  Head:  Normocephalic and atraumatic. Eyes:  Conjuctiva clear without scleral icterus. Mouth:  Oral mucosa pink and moist. Good dentition. No lesions. Heart: Normal rate and rhythm, s1 and s2 heart sounds present.  Lungs: Clear lung sounds in all lobes. Respirations equal and unlabored. Abdomen:  +BS, soft, non-tender and non-distended. No rebound or guarding. No HSM or masses noted. Neurologic:  Alert and  oriented x4 Psych:  Alert and cooperative. Normal mood and affect.  Invalid input(s): "6 MONTHS"   ASSESSMENT: [name] Short is a 78 y.o. male presenting today for follow up for history of cecal adenocarcinoma  Cecal adenocarcinoma discovered on colonoscopy in April 2023, stage II, no chemo or radiation, patient underwent partial right colectomy, he is followed by Dr. Maryalice Smaller with oncology. Most recent Colonoscopy June 2024 with 1 TA, recommended 3 year recall. No GI complaints today. He has up coming appt with Dr. Maryalice Smaller with repeat CT planned for June 2025. We will plan to  see patient on PRN basis, he will let me know if he has any new GI issues for which we will be happy to see him back in clinic for.    PLAN:  -pt to make us  aware of any new GI issues  -plan for repeat Colonoscopy in 3 years  -continue to follow with Dr. Maryalice Smaller  All questions were answered, patient verbalized understanding and is in agreement with plan as outlined above.   Follow Up: PRN   Sima Lindenberger L. Adrien Alberta, MSN, APRN, AGNP-C Adult-Gerontology Nurse Practitioner [facility]  I have reviewed the note and agree with the APP's assessment as described in this progress note  Samantha Cress, MD Gastroenterology and Hepatology [facility]

## 2024-02-10 DIAGNOSIS — N1831 Chronic kidney disease, stage 3a: Secondary | ICD-10-CM | POA: Diagnosis not present

## 2024-02-10 DIAGNOSIS — I10 Essential (primary) hypertension: Secondary | ICD-10-CM | POA: Diagnosis not present

## 2024-02-16 ENCOUNTER — Inpatient Hospital Stay: Payer: HMO | Admitting: Hematology

## 2024-02-16 ENCOUNTER — Telehealth: Payer: Self-pay

## 2024-02-16 ENCOUNTER — Inpatient Hospital Stay: Payer: HMO

## 2024-02-16 NOTE — Assessment & Plan Note (Deleted)
p(T3, N0) cM0 stage IIa, MSS -found to have a 3 cm cecal mass on routine colonoscopy on 12/19/21 by [name]. Biopsy revealed moderately differentiated adenocarcinoma -Staging CT AP 12/30/21 and chest 01/30/22 were both negative for metastatic disease. -baseline CEA 02/27/22 elevated to 44 -s/p right hemicolectomy by [name] 03/06/22, path 3.5 cm mass invading into pericolonic soft tissue. Margins and lymph nodes negative. MMR normal, MSI stable -postoperative CEA dropped to WNL. -Guardant Reveal from 05/22/22 and 07/2022 were negative. -surveillance CT scan 02/20/2023 showed NED

## 2024-02-16 NOTE — Telephone Encounter (Signed)
 Spoke with pt to make pt aware that Dr. Maryalice Smaller is cancelling his appt today.  Stated that Dr. Maryalice Smaller ordered a CT CAP w/contrast that the pt needs to complete before his next appt.  Stated someone from our [employer] will contact the pt to get him scheduled for his CT Scan.  Pt verbalized understanding and had no further questions or concerns.

## 2024-02-17 DIAGNOSIS — M5412 Radiculopathy, cervical region: Secondary | ICD-10-CM | POA: Diagnosis not present

## 2024-02-17 DIAGNOSIS — I10 Essential (primary) hypertension: Secondary | ICD-10-CM | POA: Diagnosis not present

## 2024-02-17 DIAGNOSIS — N1831 Chronic kidney disease, stage 3a: Secondary | ICD-10-CM | POA: Diagnosis not present

## 2024-02-25 ENCOUNTER — Ambulatory Visit (HOSPITAL_COMMUNITY)
Admission: RE | Admit: 2024-02-25 | Discharge: 2024-02-25 | Disposition: A | Discharge status: Home / Self Care | Source: Ambulatory Visit | Attending: Nurse Practitioner | Admitting: Nurse Practitioner

## 2024-02-25 ENCOUNTER — Inpatient Hospital Stay: Attending: Hematology

## 2024-02-25 DIAGNOSIS — C182 Malignant neoplasm of ascending colon: Secondary | ICD-10-CM | POA: Insufficient documentation

## 2024-02-25 DIAGNOSIS — C180 Malignant neoplasm of cecum: Secondary | ICD-10-CM

## 2024-02-25 DIAGNOSIS — C189 Malignant neoplasm of colon, unspecified: Secondary | ICD-10-CM | POA: Diagnosis not present

## 2024-02-25 DIAGNOSIS — C3492 Malignant neoplasm of unspecified part of left bronchus or lung: Secondary | ICD-10-CM

## 2024-02-25 DIAGNOSIS — I700 Atherosclerosis of aorta: Secondary | ICD-10-CM | POA: Diagnosis not present

## 2024-02-25 DIAGNOSIS — J439 Emphysema, unspecified: Secondary | ICD-10-CM | POA: Diagnosis not present

## 2024-02-25 DIAGNOSIS — K5730 Diverticulosis of large intestine without perforation or abscess without bleeding: Secondary | ICD-10-CM | POA: Diagnosis not present

## 2024-02-25 LAB — CBC WITH DIFFERENTIAL (CANCER CENTER ONLY)
Abs Immature Granulocytes: 0.03 10*3/uL (ref 0.00–0.07)
Basophils Absolute: 0.1 10*3/uL (ref 0.0–0.1)
Basophils Relative: 1 %
Eosinophils Absolute: 0.1 10*3/uL (ref 0.0–0.5)
Eosinophils Relative: 2 %
HCT: 38.2 % — ABNORMAL LOW (ref 39.0–52.0)
Hemoglobin: 13.8 g/dL (ref 13.0–17.0)
Immature Granulocytes: 1 %
Lymphocytes Relative: 37 %
Lymphs Abs: 2.5 10*3/uL (ref 0.7–4.0)
MCH: 32.3 pg (ref 26.0–34.0)
MCHC: 36.1 g/dL — ABNORMAL HIGH (ref 30.0–36.0)
MCV: 89.5 fL (ref 80.0–100.0)
Monocytes Absolute: 0.5 10*3/uL (ref 0.1–1.0)
Monocytes Relative: 8 %
Neutro Abs: 3.5 10*3/uL (ref 1.7–7.7)
Neutrophils Relative %: 51 %
Platelet Count: 276 10*3/uL (ref 150–400)
RBC: 4.27 MIL/uL (ref 4.22–5.81)
RDW: 12.8 % (ref 11.5–15.5)
WBC Count: 6.6 10*3/uL (ref 4.0–10.5)
nRBC: 0 % (ref 0.0–0.2)

## 2024-02-25 LAB — CMP (CANCER CENTER ONLY)
ALT: 18 U/L (ref 0–44)
AST: 20 U/L (ref 15–41)
Albumin: 4.4 g/dL (ref 3.5–5.0)
Alkaline Phosphatase: 65 U/L (ref 38–126)
Anion gap: 12 (ref 5–15)
BUN: 36 mg/dL — ABNORMAL HIGH (ref 8–23)
CO2: 21 mmol/L — ABNORMAL LOW (ref 22–32)
Calcium: 9.2 mg/dL (ref 8.9–10.3)
Chloride: 104 mmol/L (ref 98–111)
Creatinine: 1.54 mg/dL — ABNORMAL HIGH (ref 0.61–1.24)
GFR, Estimated: 46 mL/min — ABNORMAL LOW (ref >60)
Glucose, Bld: 113 mg/dL — ABNORMAL HIGH (ref 70–99)
Potassium: 4.2 mmol/L (ref 3.5–5.1)
Sodium: 137 mmol/L (ref 135–145)
Total Bilirubin: 0.7 mg/dL (ref 0.0–1.2)
Total Protein: 7.9 g/dL (ref 6.5–8.1)

## 2024-02-25 MED ORDER — SODIUM CHLORIDE (PF) 0.9 % IJ SOLN
INTRAMUSCULAR | Status: AC
  Filled 2024-02-25: qty 50

## 2024-02-25 MED ORDER — IOHEXOL 300 MG/ML  SOLN
100.0000 mL | Freq: Once | INTRAMUSCULAR | Status: AC | PRN
  Administered 2024-02-25: 100 mL via INTRAVENOUS

## 2024-02-26 LAB — CEA (ACCESS): CEA (CHCC): 2.05 ng/mL (ref 0.00–5.00)

## 2024-03-03 ENCOUNTER — Inpatient Hospital Stay (HOSPITAL_BASED_OUTPATIENT_CLINIC_OR_DEPARTMENT_OTHER): Admitting: Nurse Practitioner

## 2024-03-03 ENCOUNTER — Telehealth: Payer: Self-pay | Admitting: Nurse Practitioner

## 2024-03-03 ENCOUNTER — Encounter: Payer: Self-pay | Admitting: Nurse Practitioner

## 2024-03-03 DIAGNOSIS — M542 Cervicalgia: Secondary | ICD-10-CM | POA: Diagnosis not present

## 2024-03-03 DIAGNOSIS — Z85038 Personal history of other malignant neoplasm of large intestine: Secondary | ICD-10-CM | POA: Diagnosis not present

## 2024-03-03 DIAGNOSIS — C182 Malignant neoplasm of ascending colon: Secondary | ICD-10-CM

## 2024-03-03 NOTE — Telephone Encounter (Signed)
 Scheduled appointments per 6/19 los. Talked with the patient and he is aware of the made appointments.

## 2024-03-03 NOTE — Progress Notes (Signed)
 [facility]   [phone] [phone]    Patient Care Team: Artemisa Bile, MD as PCP - General (Internal Medicine) Ruby Corporal, MD (Inactive) as Consulting Physician (Gastroenterology) Joyce Nixon, MD as Consulting Physician (General Surgery) Sonja Buckatunna, MD as Consulting Physician (Hematology) Loni Delbridge K, NP as Nurse Practitioner (Nurse Practitioner)   I connected with [name] Short on 03/03/24 at  8:30 AM EDT by telephone visit and verified that I am speaking with the correct person using two identifiers.   I discussed the limitations, risks, security and privacy concerns of performing an evaluation and management service by telemedicine and the availability of in-person appointments. I also discussed with the patient that there may be a patient responsible charge related to this service. The patient expressed understanding and agreed to proceed.   Other persons participating in the visit and their role in the encounter: None   Patient's location: Home  Provider's location: [facility] office    Chief Complaint: CT review  Oncology History Overview Note   Cancer Staging  Cancer of left lung [facility]) Staging form: Lung, AJCC 7th Edition - Pathologic: pT2pN0cM0 - Signed by Norita Beauvais, MD on 05/27/2012 Cancer stage: pT2pN0cM0  Colon cancer [facility]) Staging form: Colon and Rectum, AJCC 8th Edition - Pathologic stage from 03/06/2022: Stage IIA (pT3, pN0, cM0) - Signed by Hennessey Cantrell K, NP on 04/14/2022 Stage prefix: Initial diagnosis Total positive nodes: 0 Histologic grading system: 4 grade system Histologic grade (G): G2     Colon cancer (HCC)  12/19/2021 Procedure   Colonoscopy impression, by Dr. Homero Luster - Likely malignant tumor in the cecum. Biopsied. - One small polyp in the distal transverse colon. Biopsied. - Five small polyps in the sigmoid colon and in the descending colon. - One 5 to 10 mm polyp at the splenic flexure,  removed with a cold snare. Resected and retrieved. - One 7 mm polyp in the mid sigmoid colon, removed with a cold snare. Resected and retrieved. - Diverticulosis in the sigmoid colon. - External hemorrhoids.   12/19/2021 Initial Biopsy   FINAL MICROSCOPIC DIAGNOSIS:  A. COLON, MASS, BIOPSY:  Invasive moderately differentiated adenocarcinoma  Tumor arises within ulcerated tubular adenoma with high-grade dysplasia  B. COLON, TRANSVERSE, DESCENDING, SIGMOID, POLYPECTOMY:  Tubular adenoma  Negative for high-grade dysplasia and carcinoma  C.  COLON, SPLENIC FLEXURE POLYP:  Tubular adenoma  Negative for high-grade dysplasia and carcinoma  D. COLON, DISTAL SIGMOID, POLYPECTOMY:  Tubular adenoma  Negative for high-grade dysplasia and carcinoma    12/30/2021 Imaging   CT AP IMPRESSION: No acute findings. No evidence of abdominal or pelvic metastatic disease.  Prior prostatectomy. Aortic Atherosclerosis (ICD10-I70.0).   01/30/2022 Imaging   CT chest IMPRESSION: 1. No evidence of pulmonary metastasis. 2. Coronary artery calcification and Aortic Atherosclerosis (ICD10-I70.0).     02/27/2022 Tumor Marker   CEA: 44.1   03/06/2022 Initial Diagnosis   Colon cancer (HCC)   03/06/2022 Cancer Staging   Staging form: Colon and Rectum, AJCC 8th Edition - Pathologic stage from 03/06/2022: Stage IIA (pT3, pN0, cM0) - Signed by Dannica Bickham K, NP on 04/14/2022 Stage prefix: Initial diagnosis Total positive nodes: 0 Histologic grading system: 4 grade system Histologic grade (G): G2   03/06/2022 Surgery   PROCEDURE:  XI ROBOT ASSISTED PARTIAL RIGHT COLECTOMY   03/06/2022 Pathology Results   FINAL MICROSCOPIC DIAGNOSIS:  A. COLON, RIGHT, RESECTION:  - Invasive moderately differentiated adenocarcinoma, 3.5 cm, involving  cecum  and proximal ascending colon  - Carcinoma invades into pericolonic soft tissue  - Resection margins are negative for carcinoma  - Seventeen benign lymph nodes (0/17)  -  Two separate tubular adenomas  - Unremarkable appendiceal stump  Procedure: Resection, right colon  Tumor Site: Cecum and proximal ascending colon  Tumor Size: 3.5 cm  Macroscopic Tumor Perforation: Not identified  Histologic Type: Adenocarcinoma  Histologic Grade: G2: Moderately differentiated  Multiple Primary Sites: Not applicable  Tumor Extension: Carcinoma invades into pericolonic soft tissue  Lymphovascular Invasion: Not identified  Perineural Invasion: Not identified  Treatment Effect: No known presurgical therapy  Margins:       Margin Status for Invasive Carcinoma: All margins negative for invasive carcinoma       Margin Status for Non-Invasive Tumor: All margins negative for high-grade dysplasia / intramucosal carcinoma and low-grade dysplasia  Regional Lymph Nodes:       Number of Lymph Nodes with Tumor: 0       Number of Lymph Nodes Examined: 17  Tumor Deposits: Not identified  Distant Metastasis:       Distant Site(s) Involved: Not applicable  Pathologic Stage Classification (pTNM, AJCC 8th Edition): pT3, pN0   MMR normal, MSI-stable   02/20/2023 Imaging    IMPRESSION: 1. No evidence of metastatic disease in the chest, abdomen, or pelvis. 2. Aortic Atherosclerosis (ICD10-I70.0) and Emphysema (ICD10-J43.9).     03/10/2023 Procedure   Colonoscopy Impression: Examined ileum normal Patent end-to-end anastomosis characterized by healthy colonic mucosa. 8 mm polyp in the cecum.  Pathology showed inflammatory polyp only. 4.  Two, 3 to 4 mm polyps in the descending colon 5.  Diverticulosis 6.  Nonbleeding, internal hemorrhoids      CURRENT THERAPY: Colon cancer surveillance  INTERVAL HISTORY [name] Short presents for phone follow-up as scheduled, last seen in office 10/2023.  He had a surveillance scan and labs last week.  Has been feeling well in general except aggravating pain in his right elbow/shoulder and up to neck and the top of his head.  Seen by Ortho who gave a  shoulder injection which has helped but has not alleviated all of the pain.  Overall his energy and appetite are normal with similar weight.  Bowels moving normally with no blood.  Denies abdominal pain or bloating or other specific concerns.  ROS  All other systems reviewed and negative  Past Medical History:  Diagnosis Date   Arthritis    ESP IN HANDS   Cancer (HCC)    OCCULAR RIGHT EYE--SURGERY AND CHEMO  PT IS LEGALLY BLIND IN RT EYE   Cataract    LEFT EYE   Chronic kidney disease    Colon cancer (HCC)    COPD (chronic obstructive pulmonary disease) (HCC)    FORMER SMOKER   Erectile dysfunction    GERD (gastroesophageal reflux disease)    Glaucoma    ONLY IN RIGHT EYE   Hypertension    Lung cancer (HCC)    lung ca dx 02/2010   Paresthesia and pain of right extremity    RIGHT SHOULDER    Pneumonia 2011   Prostate cancer (HCC)    prostate ca dx 11/11   Shortness of breath    ONLY WITH EXERTION; HX OF LOBECTOMY FOR CANER   Sleep apnea      Past Surgical History:  Procedure Laterality Date   APPENDECTOMY  02/1999   BACK SURGERY     BIOPSY  12/19/2021   Procedure: BIOPSY;  Surgeon: Homero Luster,  Mathews Solomons, MD;  [facility];  Service: Endoscopy;;   CARDIAC CATHETERIZATION  2008   indication was due to LOC, chest tightness, diaphoreses; seen at [place]; PER PATIENT , NORMAL ; no records in epic ; endoreses repeat intermittent sx since time of cath    COLONOSCOPY  08/06/2012   Procedure: COLONOSCOPY;  Surgeon: Ruby Corporal, MD;  [facility];  Service: Endoscopy;  Laterality: N/A;  955   COLONOSCOPY N/A 08/08/2015   Procedure: COLONOSCOPY;  Surgeon: Ruby Corporal, MD;  [facility];  Service: Endoscopy;  Laterality: N/A;  1200   COLONOSCOPY N/A 10/21/2018   Procedure: COLONOSCOPY;  Surgeon: Ruby Corporal, MD;  [facility];  Service: Endoscopy;  Laterality: N/A;  1030   COLONOSCOPY WITH PROPOFOL  N/A 12/19/2021    Procedure: COLONOSCOPY WITH PROPOFOL ;  Surgeon: Ruby Corporal, MD;  [facility];  Service: Endoscopy;  Laterality: N/A;  1225   COLONOSCOPY WITH PROPOFOL  N/A 03/10/2023   Procedure: COLONOSCOPY WITH PROPOFOL ;  Surgeon: Urban Garden, MD;  [facility];  Service: Gastroenterology;  Laterality: N/A;  10:30am;asa 3   ELBOW SURGERY     EYE SURGERY     OCT 1999 DETACHED RETINA -RIGHT; MAY AND JUNE 2009 RIGHT EYE SURGERY FOR CANCER-LENS HAS BEEN REMOVED.   LEFT UPPER LOBECTOMY  03/2010   FOR CANCER   PENILE PROSTHESIS IMPLANT  08/10/2012   Procedure: PENILE PROTHESIS INFLATABLE;  Surgeon: Willye Harvey, MD;  [facility]: [facility];  Service: Urology;  Laterality: N/A;  IMPLANT 3 PIECE PENILE PROSTHESIS    POLYPECTOMY  10/21/2018   Procedure: POLYPECTOMY;  Surgeon: Ruby Corporal, MD;  [facility];  Service: Endoscopy;;  colon   POLYPECTOMY  12/19/2021   Procedure: POLYPECTOMY;  Surgeon: Ruby Corporal, MD;  [facility];  Service: Endoscopy;;   POLYPECTOMY  03/10/2023   Procedure: POLYPECTOMY;  Surgeon: Urban Garden, MD;  [facility];  Service: Gastroenterology;;   PROSTATECTOMY  08/2010   FOR CANCER   SUBMUCOSAL LIFTING INJECTION  03/10/2023   Procedure: SUBMUCOSAL LIFTING INJECTION;  Surgeon: Urban Garden, MD;  [facility];  Service: Gastroenterology;;   TOTAL KNEE ARTHROPLASTY Left 09/10/2017   Procedure: LEFT TOTAL KNEE ARTHROPLASTY;  Surgeon: Orvan Blanch, MD;  [facility];  Service: Orthopedics;  Laterality: Left;  120 mins     Outpatient Encounter Medications as of 03/03/2024  Medication Sig   acetaminophen  (TYLENOL ) 650 MG CR tablet Take 1,300 mg by mouth every morning.   amLODipine  (NORVASC ) 5 MG tablet Take 5 mg by mouth daily.   aspirin  EC 81 MG tablet Take 1 tablet (81 mg total) by mouth daily.   atorvastatin (LIPITOR) 20 MG tablet Take 20 mg by mouth daily. Takes twice a  week   chlorthalidone  (HYGROTON ) 25 MG tablet Take 25 mg by mouth 2 (two) times a week. Per pt, takes only twice a week.   Cholecalciferol (VITAMIN D) 50 MCG (2000 UT) tablet Take 2,000 Units by mouth daily.   losartan  (COZAAR ) 100 MG tablet Take 100 mg by mouth daily.   Omeprazole 20 MG TBEC Take 20 mg by mouth every other day.   triamcinolone  cream (KENALOG ) 0.1 % Apply 1 application  topically daily as needed for rash.   No facility-administered encounter medications on file as of 03/03/2024.     There were no vitals filed for this visit. There  is no height or weight on file to calculate BMI.   ECOG PERFORMANCE STATUS: 0 - Asymptomatic  PHYSICAL EXAM Patient appears well by phone.  Voice is strong, speech is clear.  Mood/affect appear normal for situation.  No cough or conversational dyspnea  CBC    Latest Ref Rng & Units 02/25/2024    3:01 PM 10/19/2023   12:54 PM 06/22/2023    1:20 PM  CBC  WBC 4.0 - 10.5 K/uL 6.6  7.4  6.5   Hemoglobin 13.0 - 17.0 g/dL 03.4  74.2  59.5   Hematocrit 39.0 - 52.0 % 38.2  37.2  36.3   Platelets 150 - 400 K/uL 276  292  247       CMP     Latest Ref Rng & Units 02/25/2024    3:01 PM 10/19/2023   12:54 PM 06/22/2023    1:20 PM  CMP  Glucose 70 - 99 mg/dL 638  756  91   BUN 8 - 23 mg/dL 36  26  36   Creatinine 0.61 - 1.24 mg/dL 4.33  2.95  1.88   Sodium 135 - 145 mmol/L 137  138  140   Potassium 3.5 - 5.1 mmol/L 4.2  4.2  4.3   Chloride 98 - 111 mmol/L 104  105  108   CO2 22 - 32 mmol/L 21  23  24    Calcium 8.9 - 10.3 mg/dL 9.2  9.1  9.3   Total Protein 6.5 - 8.1 g/dL 7.9  7.4  7.5   Total Bilirubin 0.0 - 1.2 mg/dL 0.7  0.6  0.5   Alkaline Phos 38 - 126 U/L 65  68  66   AST 15 - 41 U/L 20  20  14    ALT 0 - 44 U/L 18  21  14        ASSESSMENT & PLAN: 78 yo male   Adenocarcinoma of the cecum/Right colon cancer, p(T3, N0) cM0 G2 stage IIa, MSS -Screening detected in 12/19/21 by Dr. Homero Luster. Staging CT 12/30/21 negative. -Baseline CEA 02/27/22  elevated to 44 -S/p right hemicolectomy by Dr. Andy Bannister 03/06/22. Path showed no high risk features such as perforation, LVI, or PNI. Postoperative CEA dropped to WNL. -Guardant Reveal from 05/22/22 and 07/2022 were negative. -For surveillance colonoscopy 03/11/2023 was benign, 3-year recall - [name] Short is clinically doing well.  I reviewed the recent CEA which is normal and surveillance CT which shows no evidence of disease.  He remains in clinical remission - Other labs reviewed, we discussed his renal function.  He continues PCP follow-up with next visit 06/2024.  Will also follow-up with Ortho for neck pain - Continue colon cancer surveillance, we reviewed signs and symptoms of recurrence - He is 2 years out, return for in person visit in 6 months, or sooner if needed    PLAN: -Recent labs and surveillance CT reviewed, NED -Continue colon cancer surveillance -F/up PCP in 06/2024, see us  in 08/2024   I discussed the assessment and treatment plan with the patient. The patient was provided an opportunity to ask questions and all were answered. The patient agreed with the plan and demonstrated an understanding of the instructions.   The patient was advised to call back or seek an in-person evaluation if the symptoms worsen or if the condition fails to improve as anticipated. No barriers to learning were detected. I spent 10 minutes counseling the patient non face to face. The total time spent in the appointment was  15 minutes and more than 50% was on counseling, review of test results, and coordination of care.   Nymir Ringler K Ahnya Akre, NP 03/03/2024

## 2024-06-01 ENCOUNTER — Emergency Department (HOSPITAL_COMMUNITY)
Admission: EM | Admit: 2024-06-01 | Discharge: 2024-06-01 | Disposition: A | Discharge status: Home / Self Care | Attending: Emergency Medicine | Admitting: Emergency Medicine

## 2024-06-01 ENCOUNTER — Encounter (HOSPITAL_COMMUNITY): Payer: Self-pay | Admitting: Emergency Medicine

## 2024-06-01 ENCOUNTER — Emergency Department (HOSPITAL_COMMUNITY)

## 2024-06-01 ENCOUNTER — Other Ambulatory Visit: Payer: Self-pay

## 2024-06-01 DIAGNOSIS — Z79899 Other long term (current) drug therapy: Secondary | ICD-10-CM | POA: Insufficient documentation

## 2024-06-01 DIAGNOSIS — R569 Unspecified convulsions: Secondary | ICD-10-CM | POA: Insufficient documentation

## 2024-06-01 DIAGNOSIS — R55 Syncope and collapse: Secondary | ICD-10-CM | POA: Insufficient documentation

## 2024-06-01 DIAGNOSIS — I700 Atherosclerosis of aorta: Secondary | ICD-10-CM | POA: Diagnosis not present

## 2024-06-01 DIAGNOSIS — I6782 Cerebral ischemia: Secondary | ICD-10-CM | POA: Diagnosis not present

## 2024-06-01 DIAGNOSIS — R140 Abdominal distension (gaseous): Secondary | ICD-10-CM | POA: Diagnosis not present

## 2024-06-01 DIAGNOSIS — R410 Disorientation, unspecified: Secondary | ICD-10-CM | POA: Diagnosis not present

## 2024-06-01 DIAGNOSIS — N3000 Acute cystitis without hematuria: Secondary | ICD-10-CM | POA: Diagnosis not present

## 2024-06-01 DIAGNOSIS — Z7982 Long term (current) use of aspirin: Secondary | ICD-10-CM | POA: Insufficient documentation

## 2024-06-01 DIAGNOSIS — I10 Essential (primary) hypertension: Secondary | ICD-10-CM | POA: Insufficient documentation

## 2024-06-01 DIAGNOSIS — R4182 Altered mental status, unspecified: Secondary | ICD-10-CM | POA: Diagnosis not present

## 2024-06-01 LAB — URINALYSIS, ROUTINE W REFLEX MICROSCOPIC
Bilirubin Urine: NEGATIVE
Glucose, UA: NEGATIVE mg/dL
Hgb urine dipstick: NEGATIVE
Ketones, ur: NEGATIVE mg/dL
Nitrite: NEGATIVE
Protein, ur: NEGATIVE mg/dL
Specific Gravity, Urine: 1.01 (ref 1.005–1.030)
pH: 5 (ref 5.0–8.0)

## 2024-06-01 LAB — COMPREHENSIVE METABOLIC PANEL WITH GFR
ALT: 15 U/L (ref 0–44)
AST: 16 U/L (ref 15–41)
Albumin: 4 g/dL (ref 3.5–5.0)
Alkaline Phosphatase: 70 U/L (ref 38–126)
Anion gap: 15 (ref 5–15)
BUN: 38 mg/dL — ABNORMAL HIGH (ref 8–23)
CO2: 19 mmol/L — ABNORMAL LOW (ref 22–32)
Calcium: 8.7 mg/dL — ABNORMAL LOW (ref 8.9–10.3)
Chloride: 105 mmol/L (ref 98–111)
Creatinine, Ser: 1.51 mg/dL — ABNORMAL HIGH (ref 0.61–1.24)
GFR, Estimated: 47 mL/min — ABNORMAL LOW (ref >60)
Glucose, Bld: 126 mg/dL — ABNORMAL HIGH (ref 70–99)
Potassium: 3.9 mmol/L (ref 3.5–5.1)
Sodium: 139 mmol/L (ref 135–145)
Total Bilirubin: 0.9 mg/dL (ref 0.0–1.2)
Total Protein: 7.6 g/dL (ref 6.5–8.1)

## 2024-06-01 LAB — CBC
HCT: 36.7 % — ABNORMAL LOW (ref 39.0–52.0)
Hemoglobin: 12.5 g/dL — ABNORMAL LOW (ref 13.0–17.0)
MCH: 32 pg (ref 26.0–34.0)
MCHC: 34.1 g/dL (ref 30.0–36.0)
MCV: 93.9 fL (ref 80.0–100.0)
Platelets: 339 K/uL (ref 150–400)
RBC: 3.91 MIL/uL — ABNORMAL LOW (ref 4.22–5.81)
RDW: 13.1 % (ref 11.5–15.5)
WBC: 8.3 K/uL (ref 4.0–10.5)
nRBC: 0 % (ref 0.0–0.2)

## 2024-06-01 LAB — TROPONIN I (HIGH SENSITIVITY)
Troponin I (High Sensitivity): 3 ng/L (ref <18)
Troponin I (High Sensitivity): 3 ng/L (ref <18)

## 2024-06-01 LAB — CBG MONITORING, ED: Glucose-Capillary: 124 mg/dL — ABNORMAL HIGH (ref 70–99)

## 2024-06-01 MED ORDER — SODIUM CHLORIDE 0.9 % IV BOLUS
1000.0000 mL | Freq: Once | INTRAVENOUS | Status: AC
  Administered 2024-06-01: 1000 mL via INTRAVENOUS

## 2024-06-01 MED ORDER — CEPHALEXIN 500 MG PO CAPS
500.0000 mg | ORAL_CAPSULE | Freq: Four times a day (QID) | ORAL | 0 refills | Status: DC
Start: 2024-06-01 — End: 2024-06-06

## 2024-06-01 NOTE — ED Notes (Signed)
 Pt color wnl at this time. Pt was taken to bathroom, cbg done and cleaned up. Pt denies dizziness now but states prior to the episode he was briefly. Steady gait from w/c to stretcher. Non diaphoretic at this time.

## 2024-06-01 NOTE — Discharge Instructions (Signed)
 Follow-up next week with your family doctor for your urinary tract infection and follow-up with the neurologist for seizures.  Do not do any driving

## 2024-06-01 NOTE — ED Triage Notes (Addendum)
 Pt waiting in day surgery due to daughter having a procedure and wife stated pt had a staring episode then went back in chair with upper body shaking and stiffness, eyes rolled back per wife. Did not fall. Lasted approx 10 sec. 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 Pt arrived pale/diaphoretic a/o and was incontinent of bowel.

## 2024-06-02 NOTE — Progress Notes (Signed)
 Cardiology Office Note Date:  06/06/2024  ID:  [name] Short, [name] Short [name] Short 20, 1947, MRN [id number] PCP:  Sheryle Carwin, MD  Cardiologist:  Joelle VEAR Ren Donley, MD  Chief Complaint  Patient presents with   PVC      Problems Bigeminy on ECG HTN on LN100 and CE25 2x/weekly HLD LDL 111 05/2023  Visits  09/22: TTE, ziopatch 72 hrs, AAA duplex, XL25, LP/HA1C    History of Present Illness: [name] Short is a 78 y.o. male who presents for PVCs.  He was admitted last week for seizures and while in the ED, he started having high burden PVCs and episodes of NSVT. He was asymptomatic during that time. He denies any chest palpitations or pre-syncope. He denies CP, orthopnea, PND or LE edema but reports stable dyspnea while doing the daily walk with his daughter. His BP at other offices had been controlled but since his seizure last week, it has been more elevated.   ROS: Please see the history of present illness. All other systems are reviewed and negative.   Past Medical History:  Diagnosis Date   Arthritis    ESP IN HANDS   Cancer (HCC)    OCCULAR RIGHT EYE--SURGERY AND CHEMO  PT IS LEGALLY BLIND IN RT EYE   Cataract    LEFT EYE   Chronic kidney disease    Colon cancer (HCC)    COPD (chronic obstructive pulmonary disease) (HCC)    FORMER SMOKER   Erectile dysfunction    GERD (gastroesophageal reflux disease)    Glaucoma    ONLY IN RIGHT EYE   Hypertension    Lung cancer (HCC)    lung ca dx 02/2010   Paresthesia and pain of right extremity    RIGHT SHOULDER    Pneumonia 2011   Prostate cancer (HCC)    prostate ca dx 11/11   Shortness of breath    ONLY WITH EXERTION; HX OF LOBECTOMY FOR CANER   Sleep apnea     Past Surgical History:  Procedure Laterality Date   APPENDECTOMY  02/1999   BACK SURGERY     BIOPSY  12/19/2021   Procedure: BIOPSY;  Surgeon: Golda Claudis PENNER, MD;  [facility];  Service: Endoscopy;;   CARDIAC CATHETERIZATION  2008   indication was due  to LOC, chest tightness, diaphoreses; seen at [facility]; PER PATIENT , NORMAL ; no records in epic ; endoreses repeat intermittent sx since time of cath    COLONOSCOPY  08/06/2012   Procedure: COLONOSCOPY;  Surgeon: Claudis PENNER Golda, MD;  [facility];  Service: Endoscopy;  Laterality: N/A;  955   COLONOSCOPY N/A 08/08/2015   Procedure: COLONOSCOPY;  Surgeon: Claudis PENNER Golda, MD;  [facility];  Service: Endoscopy;  Laterality: N/A;  1200   COLONOSCOPY N/A 10/21/2018   Procedure: COLONOSCOPY;  Surgeon: Golda Claudis PENNER, MD;  [facility];  Service: Endoscopy;  Laterality: N/A;  1030   COLONOSCOPY WITH PROPOFOL  N/A 12/19/2021   Procedure: COLONOSCOPY WITH PROPOFOL ;  Surgeon: Golda Claudis PENNER, MD;  [facility];  Service: Endoscopy;  Laterality: N/A;  1225   COLONOSCOPY WITH PROPOFOL  N/A 03/10/2023   Procedure: COLONOSCOPY WITH PROPOFOL ;  Surgeon: Eartha Angelia Sieving, MD;  [facility];  Service: Gastroenterology;  Laterality: N/A;  10:30am;asa 3   ELBOW SURGERY     EYE SURGERY     OCT 1999 DETACHED RETINA -RIGHT; MAY AND JUNE 2009 RIGHT EYE  SURGERY FOR CANCER-LENS HAS BEEN REMOVED.   LEFT UPPER LOBECTOMY  03/2010   FOR CANCER   PENILE PROSTHESIS IMPLANT  08/10/2012   Procedure: PENILE PROTHESIS INFLATABLE;  Surgeon: Norleen JINNY Seltzer, MD;  [facility];  Service: Urology;  Laterality: N/A;  IMPLANT 3 PIECE PENILE PROSTHESIS    POLYPECTOMY  10/21/2018   Procedure: POLYPECTOMY;  Surgeon: Golda Claudis PENNER, MD;  [facility];  Service: Endoscopy;;  colon   POLYPECTOMY  12/19/2021   Procedure: POLYPECTOMY;  Surgeon: Golda Claudis PENNER, MD;  [facility];  Service: Endoscopy;;   POLYPECTOMY  03/10/2023   Procedure: POLYPECTOMY;  Surgeon: Eartha Angelia Sieving, MD;  [facility];  Service: Gastroenterology;;   PROSTATECTOMY  08/2010   FOR CANCER   SUBMUCOSAL LIFTING INJECTION  03/10/2023   Procedure:  SUBMUCOSAL LIFTING INJECTION;  Surgeon: Eartha Angelia Sieving, MD;  [facility];  Service: Gastroenterology;;   TOTAL KNEE ARTHROPLASTY Left 09/10/2017   Procedure: LEFT TOTAL KNEE ARTHROPLASTY;  Surgeon: Duwayne Purchase, MD;  [facility];  Service: Orthopedics;  Laterality: Left;  120 mins    Current Outpatient Medications  Medication Sig Dispense Refill   acetaminophen  (TYLENOL ) 650 MG CR tablet Take 1,300 mg by mouth every morning.     amLODipine  (NORVASC ) 5 MG tablet Take 5 mg by mouth daily.     aspirin  EC 81 MG tablet Take 1 tablet (81 mg total) by mouth daily. 30 tablet 11   atorvastatin (LIPITOR) 20 MG tablet Take 20 mg by mouth 2 (two) times a week. Takes twice a week     cefpodoxime (VANTIN) 200 MG tablet Take 200 mg by mouth 2 (two) times daily.     chlorthalidone  (HYGROTON ) 25 MG tablet Take 25 mg by mouth 2 (two) times a week. Per pt, takes only twice a week.     Cholecalciferol (VITAMIN D) 50 MCG (2000 UT) tablet Take 2,000 Units by mouth daily.     diphenhydrAMINE  (BENADRYL ) 25 mg capsule Take 25-50 mg by mouth every 6 (six) hours as needed for allergies.     guaiFENesin (MUCINEX) 600 MG 12 hr tablet Take 600 mg by mouth 2 (two) times daily.     losartan  (COZAAR ) 100 MG tablet Take 100 mg by mouth daily.     Omeprazole 20 MG TBEC Take 20 mg by mouth every other day.     triamcinolone  cream (KENALOG ) 0.1 % Apply 1 application  topically daily as needed for rash.     No current facility-administered medications for this visit.    Allergies:   Silicone   Social History:  Former tobacco use 45 pack year  Family History:  Noncontributory  PHYSICAL EXAM: VS:  BP (!) 164/54   Pulse 64   Ht 5' 7 (1.702 m)   Wt 173 lb (78.5 kg)   SpO2 98%   BMI 27.10 kg/m  , BMI Body mass index is 27.1 kg/m. GEN: Well nourished, well developed, in no acute distress HEENT: normal Neck: no JVD, carotid bruits, or masses Cardiac: RRR w/ ectopic beats; no murmurs, rubs,  or gallops,no edema  Respiratory:  CTAB bilaterally, normal work of breathing GI: soft, nontender, nondistended, + BS Extremities: No LE edema Skin: warm and dry, no rash Neuro:  Strength and sensation are intact  EKG: NSR w/ PVCs  Recent Labs: Reviewed Studies: Reviewed  ASSESSMENT AND PLAN: [name] Short is a 78 y.o. male who presents for syncope.   #PVCs #  Former tobacco use 45-pack year #HTN #HLD - Presenting with new diagnosis of PVCs while admitting for seizure. He denies any symptoms from PVC standpoint. In the hospital, he was having bigeminy and continues to have high burden on ECG today. Will order ziopatch for 72 hrs to quantify burden, TTE to assess function, and start metop XL 25 mg daily - AAA screening with U/S given tobacco Hx - LP/HA1C; has been on lipitor 20 mg 2x/week - Follow up in 3 months  Signed, Joelle VEAR Ren Donley, MD  06/06/2024 3:30 PM    [facility]

## 2024-06-02 NOTE — ED Provider Notes (Signed)
 [facility] AT [facility] Provider Note   CSN: [id number] Arrival date & time: 06/01/24  9296     Patient presents with: Loss of Consciousness   [name] Short is a 78 y.o. male.   Patient has a history of hypertension and was at the hospital with a family member who was going to have surgery.  Patient had a seizure.  When he arrived in the ED he was awake and mildly confused  The history is provided by the patient and medical records. No language interpreter was used.  Loss of Consciousness Episode history:  Single Most recent episode:  Today Timing:  Rare Progression:  Resolved Chronicity:  New Context: not blood draw   Relieved by:  Nothing Ineffective treatments:  None tried Associated symptoms: no anxiety, no chest pain, no headaches and no seizures        Prior to Admission medications   Medication Sig Start Date End Date Taking? Authorizing Provider  acetaminophen  (TYLENOL ) 650 MG CR tablet Take 1,300 mg by mouth every morning.   Yes [provider]  amLODipine  (NORVASC ) 5 MG tablet Take 5 mg by mouth daily.   Yes [provider]  aspirin  EC 81 MG tablet Take 1 tablet (81 mg total) by mouth daily. 12/22/21  Yes Rehman, Claudis PENNER, MD  atorvastatin (LIPITOR) 20 MG tablet Take 20 mg by mouth daily. Takes twice a week   Yes [provider]  cephALEXin  (KEFLEX ) 500 MG capsule Take 1 capsule (500 mg total) by mouth 4 (four) times daily. 06/01/24  Yes Suzette Pac, MD  chlorthalidone  (HYGROTON ) 25 MG tablet Take 25 mg by mouth 2 (two) times a week. Per pt, takes only twice a week.   Yes Sheryle Carwin, MD  Cholecalciferol (VITAMIN D) 50 MCG (2000 UT) tablet Take 2,000 Units by mouth daily.   Yes [provider]  diphenhydrAMINE  (BENADRYL ) 25 mg capsule Take 25-50 mg by mouth every 6 (six) hours as needed for allergies.   Yes [provider]  guaiFENesin (MUCINEX) 600 MG 12 hr tablet Take 600 mg by mouth 2  (two) times daily.   Yes [provider]  losartan  (COZAAR ) 100 MG tablet Take 100 mg by mouth daily.   Yes [provider]  Omeprazole 20 MG TBEC Take 20 mg by mouth every other day.   Yes [provider]  triamcinolone  cream (KENALOG ) 0.1 % Apply 1 application  topically daily as needed for rash. 01/29/22  Yes [provider]    Allergies: Silicone    Review of Systems  Constitutional:  Negative for appetite change and fatigue.  HENT:  Negative for congestion, ear discharge and sinus pressure.   Eyes:  Negative for discharge.  Respiratory:  Negative for cough.   Cardiovascular:  Positive for syncope. Negative for chest pain.  Gastrointestinal:  Negative for abdominal pain and diarrhea.  Genitourinary:  Negative for frequency and hematuria.  Musculoskeletal:  Negative for back pain.  Skin:  Negative for rash.  Neurological:  Negative for seizures and headaches.  Psychiatric/Behavioral:  Negative for hallucinations.     Updated Vital Signs BP 122/68   Pulse 64   Temp 97.7 F (36.5 C) (Oral)   Resp 14   SpO2 94%   Physical Exam Vitals and nursing note reviewed.  Constitutional:      Appearance: He is well-developed.     Comments: Mildly confused  HENT:     Head: Normocephalic.  Nose: Nose normal.     Mouth/Throat:     Mouth: Mucous membranes are moist.  Eyes:     General: No scleral icterus.    Conjunctiva/sclera: Conjunctivae normal.  Neck:     Thyroid: No thyromegaly.  Cardiovascular:     Rate and Rhythm: Normal rate and regular rhythm.     Heart sounds: No murmur heard.    No friction rub. No gallop.  Pulmonary:     Breath sounds: No stridor. No wheezing or rales.  Chest:     Chest wall: No tenderness.  Abdominal:     General: There is no distension.     Tenderness: There is no abdominal tenderness. There is no rebound.  Musculoskeletal:        General: Normal range of motion.     Cervical back: Neck supple.   Lymphadenopathy:     Cervical: No cervical adenopathy.  Skin:    Findings: No erythema or rash.  Neurological:     Mental Status: He is oriented to person, place, and time.     Motor: No abnormal muscle tone.     Coordination: Coordination normal.  Psychiatric:        Behavior: Behavior normal.     (all labs ordered are listed, but only abnormal results are displayed) Labs Reviewed  COMPREHENSIVE METABOLIC PANEL WITH GFR - Abnormal; Notable for the following components:      Result Value   CO2 19 (*)    Glucose, Bld 126 (*)    BUN 38 (*)    Creatinine, Ser 1.51 (*)    Calcium 8.7 (*)    GFR, Estimated 47 (*)    All other components within normal limits  CBC - Abnormal; Notable for the following components:   RBC 3.91 (*)    Hemoglobin 12.5 (*)    HCT 36.7 (*)    All other components within normal limits  URINALYSIS, ROUTINE W REFLEX MICROSCOPIC - Abnormal; Notable for the following components:   Color, Urine STRAW (*)    Leukocytes,Ua TRACE (*)    Bacteria, UA MANY (*)    All other components within normal limits  CBG MONITORING, ED - Abnormal; Notable for the following components:   Glucose-Capillary 124 (*)    All other components within normal limits  URINE CULTURE  TROPONIN I (HIGH SENSITIVITY)  TROPONIN I (HIGH SENSITIVITY)    EKG: EKG Interpretation Date/Time:  Wednesday June 01 2024 10:45:25 EDT Ventricular Rate:  84 PR Interval:  158 QRS Duration:  101 QT Interval:  428 QTC Calculation: 395 R Axis:   63  Text Interpretation: Sinus rhythm Ventricular bigeminy Low voltage, extremity leads Confirmed by Tonia Chew [phone]) on 06/02/2024 10:35:12 AM  Radiology: CT Head Wo Contrast Result Date: 06/01/2024 CLINICAL DATA:  Seizure, new-onset, no history of trauma. EXAM: CT HEAD WITHOUT CONTRAST TECHNIQUE: Contiguous axial images were obtained from the base of the skull through the vertex without intravenous contrast. RADIATION DOSE REDUCTION: This exam  was performed according to the departmental dose-optimization program which includes automated exposure control, adjustment of the mA and/or kV according to patient size and/or use of iterative reconstruction technique. COMPARISON:  Head CT 03/05/2010 FINDINGS: Brain: There is no evidence of an acute infarct, intracranial hemorrhage, mass, midline shift, hydrocephalus, or extra-axial fluid collection. Mild cerebral atrophy is within normal limits for age. Cerebral white matter hypodensities are new and nonspecific but compatible with mild chronic small vessel ischemic disease. Vascular: Calcified atherosclerosis at the skull base. No  hyperdense vessel. Skull: No acute calvarial fracture or suspicious lesion. Partially visualized chronic nasal bone deformity. Sinuses/Orbits: Old medial left orbital fracture. Bilateral cataract extraction and right scleral buckle. No significant inflammatory changes in the included paranasal sinuses. Clear mastoid air cells. Other: None. IMPRESSION: 1. No evidence of acute intracranial abnormality. 2. Mild chronic small vessel ischemic disease. Electronically Signed   By: Dasie Hamburg M.D.   On: 06/01/2024 08:22   DG Chest Port 1 View Result Date: 06/01/2024 CLINICAL DATA:  78 year old male with acute onset altered mental status, shaking and stiffness while waiting on family member in surgery. History of colon cancer, left upper lobectomy. EXAM: PORTABLE CHEST 1 VIEW COMPARISON:  Chest radiographs 05/31/2019. CT Chest, Abdomen, and Pelvis 02/25/2024 FINDINGS: Portable AP upright view at 0752 hours. Stable volume loss in the left hemithorax from upper lobectomy. Calcified aortic atherosclerosis. Other mediastinal contours are within normal limits. Visualized tracheal air column is within normal limits. Allowing for portable technique the lungs are clear. No pneumothorax or pleural effusion. Paucity of visible bowel gas. No acute osseous abnormality identified. IMPRESSION: 1. Stable  postoperative appearance of the chest status post left upper lobectomy. No acute cardiopulmonary abnormality. 2. Aortic Atherosclerosis (ICD10-I70.0). Electronically Signed   By: VEAR Hurst M.D.   On: 06/01/2024 08:05     Procedures   Medications Ordered in the ED  sodium chloride  0.9 % bolus 1,000 mL (0 mLs Intravenous Stopped 06/01/24 1103)                                    Medical Decision Making Amount and/or Complexity of Data Reviewed Labs: ordered. Radiology: ordered.  Risk Prescription drug management.   Patient with new onset seizures.  Labs and CT scan unremarkable.  Patient also has a urinary tract infection.  He will be placed on antibiotics and will follow-up with his family doctor for the UTI and neurology for the seizures     Final diagnoses:  Seizure [facility])  Acute cystitis without hematuria    ED Discharge Orders          Ordered    cephALEXin  (KEFLEX ) 500 MG capsule  4 times daily        06/01/24 1302    Ambulatory referral to Neurology       Comments: An appointment is requested in approximately: 2 weeks   06/01/24 1303               Suzette Pac, MD 06/02/24 1725

## 2024-06-03 ENCOUNTER — Encounter: Payer: Self-pay | Admitting: Neurology

## 2024-06-03 LAB — URINE CULTURE: Culture: >=100000 — AB

## 2024-06-04 ENCOUNTER — Telehealth (HOSPITAL_BASED_OUTPATIENT_CLINIC_OR_DEPARTMENT_OTHER): Payer: Self-pay

## 2024-06-04 NOTE — Telephone Encounter (Signed)
 Post ED Visit - Positive Culture Follow-up: Successful Patient Follow-Up  Culture assessed and recommendations reviewed by:  [x]  Dorn Poot, Pharm.D. []  Venetia Gully, [address].D., BCPS AQ-ID []  Garrel Crews, Pharm.D., BCPS []  Almarie Lunger, Pharm.D., BCPS []  Colbert, [place].D., BCPS, AAHIVP []  Rosaline Bihari, Pharm.D., BCPS, AAHIVP []  Vernell Meier, PharmD, BCPS []  Latanya Hint, PharmD, BCPS []  Donald Medley, PharmD, BCPS []  Rocky Bold, PharmD  Positive urine culture  []  Patient discharged without antimicrobial prescription and treatment is now indicated [x]  Organism is resistant to prescribed ED discharge antimicrobial []  Patient with positive blood cultures  Changes discussed with ED provider: [name],  New antibiotic prescription Cefpodoxime 200 mg po BID x 7 days Called to [facility] in [place]  Contacted patient, date 06/04/2024, time 12:09 am   [name] Short 06/04/2024, 12:10 PM

## 2024-06-04 NOTE — Progress Notes (Signed)
 ED Antimicrobial Stewardship Positive Culture Follow Up   [name] Short is an 78 y.o. male who presented to [facility] on 06/01/2024 with a chief complaint of  Chief Complaint  Patient presents with   Loss of Consciousness    Recent Results (from the past 720 hours)  Urine Culture     Status: Abnormal   Collection Time: 06/01/24 12:20 PM   Specimen: Urine, Clean Catch  Result Value Ref Range Status   Specimen Description   Final    URINE, CLEAN CATCH Performed at [facility], [address] [place], [place] [address]    Special Requests   Final    NONE Performed at [facility], [address] [place], [place] [address]    Culture >=100,000 COLONIES/mL KLEBSIELLA OXYTOCA (A)  Final   Report Status 06/03/2024 FINAL  Final   Organism ID, Bacteria KLEBSIELLA OXYTOCA (A)  Final      Susceptibility   Klebsiella oxytoca - MIC*    AMPICILLIN >=32 RESISTANT Resistant     CEFEPIME <=0.12 SENSITIVE Sensitive     ERTAPENEM <=0.12 SENSITIVE Sensitive     CEFTRIAXONE <=0.25 SENSITIVE Sensitive     CIPROFLOXACIN  <=0.06 SENSITIVE Sensitive     GENTAMICIN <=1 SENSITIVE Sensitive     NITROFURANTOIN <=16 SENSITIVE Sensitive     TRIMETH/SULFA <=20 SENSITIVE Sensitive     AMPICILLIN/SULBACTAM 8 SENSITIVE Sensitive     PIP/TAZO Value in next row Sensitive      <=4 SENSITIVEThis is a modified FDA-approved test that has been validated and its performance characteristics determined by the reporting laboratory.  This laboratory is certified under the Clinical Laboratory Improvement Amendments CLIA as qualified to perform high complexity clinical laboratory testing.    MEROPENEM Value in next row Sensitive      <=4 SENSITIVEThis is a modified FDA-approved test that has been validated and its performance characteristics determined by the reporting laboratory.  This laboratory is certified under the Clinical Laboratory Improvement Amendments CLIA as qualified to perform high complexity clinical laboratory  testing.    * >=100,000 COLONIES/mL KLEBSIELLA OXYTOCA    [x]  Treated with Keflex , organism resistant to prescribed antimicrobial []  Patient discharged originally without antimicrobial agent and treatment is now indicated  New antibiotic prescription: Cefpodoxime  ED Provider: Darice Showers, PA-C   Dorn Poot 06/04/2024, 10:12 AM Clinical Pharmacist Monday - Friday phone -  [phone] Saturday - Sunday phone - [phone]

## 2024-06-06 ENCOUNTER — Ambulatory Visit

## 2024-06-06 VITALS — BP 164/54 | HR 64 | Ht 67.0 in | Wt 173.0 lb

## 2024-06-06 DIAGNOSIS — Z139 Encounter for screening, unspecified: Secondary | ICD-10-CM

## 2024-06-06 DIAGNOSIS — I498 Other specified cardiac arrhythmias: Secondary | ICD-10-CM

## 2024-06-06 DIAGNOSIS — I493 Ventricular premature depolarization: Secondary | ICD-10-CM

## 2024-06-06 DIAGNOSIS — I10 Essential (primary) hypertension: Secondary | ICD-10-CM

## 2024-06-06 DIAGNOSIS — R55 Syncope and collapse: Secondary | ICD-10-CM

## 2024-06-06 DIAGNOSIS — E785 Hyperlipidemia, unspecified: Secondary | ICD-10-CM

## 2024-06-06 DIAGNOSIS — Z87891 Personal history of nicotine dependence: Secondary | ICD-10-CM | POA: Diagnosis not present

## 2024-06-06 LAB — LIPID PANEL

## 2024-06-06 MED ORDER — METOPROLOL SUCCINATE ER 25 MG PO TB24: 25.0000 mg | ORAL_TABLET | Freq: Every day | ORAL | 3 refills | Status: AC

## 2024-06-06 NOTE — Patient Instructions (Addendum)
 Medication Instructions:  Your physician has recommended you make the following change in your medication:  Start toprol  XL 25mg  daily  Lab Work: A1C, Lipid panel -- Today  You may go to any [facility] Location for your lab work:  [facility] - [address] ([facility]) - [address] [phone] [address]  [place] - [address]   [place]  - [address]   [place] - [address] - [address] [employer]  - [address] ([facility]   If you have labs (blood work) drawn today and your tests are completely normal, you will receive your results only by: [employer] (if you have MyChart)  If you have any lab test that is abnormal or we need to change your treatment, we will call you or send a MyChart message to review the results.  Testing/Procedures:  Your physician has requested that you have an abdominal aorta duplex. During this test, an ultrasound is used to evaluate the aorta. Allow 30 minutes for this exam. Do not eat after midnight the day before and avoid carbonated beverages. This will take place at [address] 4th floor.  Please note: We ask at that you not bring children with you during ultrasound (echo/ vascular) testing. Due to room size and safety concerns, children are not allowed in the ultrasound rooms during exams. Our front office staff cannot provide observation of children in our lobby area while testing is being conducted. An adult accompanying a patient to their appointment will only be allowed in the ultrasound room at the discretion of the ultrasound technician under special circumstances. We apologize for any inconvenience.    Your physician has requested that you have an echocardiogram. Echocardiography is a painless test that uses sound waves to create images of your heart. It provides your doctor with  information about the size and shape of your heart and how well your heart's chambers and valves are working. This procedure takes approximately one hour. There are no restrictions for this procedure. Please do NOT wear cologne, perfume, aftershave, or lotions (deodorant is allowed). Please arrive 15 minutes prior to your appointment time.  Please note: We ask at that you not bring children with you during ultrasound (echo/ vascular) testing. Due to room size and safety concerns, children are not allowed in the ultrasound rooms during exams. Our front office staff cannot provide observation of children in our lobby area while testing is being conducted. An adult accompanying a patient to their appointment will only be allowed in the ultrasound room at the discretion of the ultrasound technician under special circumstances. We apologize for any inconvenience.    ZIO XT- Long Term Monitor Instructions  Your physician has requested you wear a ZIO patch monitor for 3 days.   This is a single patch monitor. [employer] supplies one patch monitor per enrollment. Additional  stickers are not available. Please do not apply patch if you will be having a Nuclear Stress Test,  Echocardiogram, Cardiac CT, MRI, or Chest Xray during the period you would be wearing the  monitor. The patch cannot be worn during these tests. You cannot remove and re-apply the  ZIO XT patch monitor.   Your ZIO patch monitor will be mailed 3 day USPS to your address on file. It may take 3-5 days  to receive your  monitor after you have been enrolled.   Once you have received your monitor, please review the enclosed instructions. Your monitor  has already been registered assigning a specific monitor serial # to you.     Billing and Patient Assistance Program Information  We have supplied [employer] with any of your insurance information on file for billing purposes.  [employer] offers a sliding scale Patient Assistance Program for patients  that do not have  insurance, or whose insurance does not completely cover the cost of the ZIO monitor.  You must apply for the Patient Assistance Program to qualify for this discounted rate.   To apply, please call [employer] at [phone], select option 4, select option 2, ask to apply for  Patient Assistance Program. Meredeth will ask your household income, and how many people  are in your household. They will quote your out-of-pocket cost based on that information.  [employer] will also be able to set up a 51-month, interest-free payment plan if needed.     Applying the monitor  Shave hair from upper left chest.  Hold abrader disc by orange tab. Rub abrader in 40 strokes over the upper left chest as  indicated in your monitor instructions.  Clean area with 4 enclosed alcohol pads. Let dry.  Apply patch as indicated in monitor instructions. Patch will be placed under collarbone on left  side of chest with arrow pointing upward.  Rub patch adhesive wings for 2 minutes. Remove white label marked 1. Remove the white  label marked 2. Rub patch adhesive wings for 2 additional minutes.  While looking in a mirror, press and release button in center of patch. A small green light will  flash 3-4 times. This will be your only indicator that the monitor has been turned on.  Do not shower for the first 24 hours. You may shower after the first 24 hours.  Press the button if you feel a symptom. You will hear a small click. Record Date, Time and  Symptom in the Patient Logbook.  When you are ready to remove the patch, follow instructions on the last 2 pages of Patient  Logbook. Stick patch monitor onto the last page of Patient Logbook.   Place Patient Logbook in the blue and white box. Use locking tab on box and tape box closed  securely. The blue and white box has prepaid postage on it. Please place it in the mailbox as  soon as possible. Your physician should have your test results approximately 7  days after the  monitor has been mailed back to [facility].   Call [facility] Customer Care at [phone] if you have questions regarding  your ZIO XT patch monitor. Call them immediately if you see an orange light blinking on your  monitor.   If your monitor falls off in less than 4 days, contact our Monitor department at [phone].   If your monitor becomes loose or falls off after 4 days call [employer] at [phone] for  suggestions on securing your monitor.      Follow-Up: At [facility], you and your health needs are our priority.  As part of our continuing mission to provide you with exceptional heart care, we have created designated Provider Care Teams.  These Care Teams include your primary Cardiologist (physician) and Advanced Practice Providers (APPs -  Physician Assistants and Nurse Practitioners) who all work together to provide you with the care you need, when you need it.   Your next appointment:  3 month  The format for your next appointment:   In Person  Provider:   Joelle Cedars, MD

## 2024-06-06 NOTE — Progress Notes (Unsigned)
 Enrolled for [employer] to mail a ZIO XT long term holter monitor to the patients address on file.

## 2024-06-07 ENCOUNTER — Ambulatory Visit: Payer: Self-pay

## 2024-06-07 LAB — HEMOGLOBIN A1C
Est. average glucose Bld gHb Est-mCnc: 128 mg/dL
Hgb A1c MFr Bld: 6.1 % — ABNORMAL HIGH (ref 4.8–5.6)

## 2024-06-07 LAB — LIPID PANEL
Cholesterol, Total: 154 mg/dL (ref 100–199)
HDL: 36 mg/dL — AB (ref >39)
LDL CALC COMMENT:: 4.3 ratio (ref 0.0–5.0)
LDL Chol Calc (NIH): 96 mg/dL (ref 0–99)
Triglycerides: 124 mg/dL (ref 0–149)
VLDL Cholesterol Cal: 22 mg/dL (ref 5–40)

## 2024-06-08 ENCOUNTER — Encounter: Payer: Self-pay | Admitting: Neurology

## 2024-06-08 ENCOUNTER — Ambulatory Visit: Admitting: Neurology

## 2024-06-08 VITALS — BP 98/54 | HR 67 | Ht 67.0 in | Wt 175.5 lb

## 2024-06-08 DIAGNOSIS — G44229 Chronic tension-type headache, not intractable: Secondary | ICD-10-CM

## 2024-06-08 DIAGNOSIS — M5412 Radiculopathy, cervical region: Secondary | ICD-10-CM

## 2024-06-08 DIAGNOSIS — R569 Unspecified convulsions: Secondary | ICD-10-CM

## 2024-06-08 DIAGNOSIS — M542 Cervicalgia: Secondary | ICD-10-CM

## 2024-06-08 NOTE — Progress Notes (Signed)
 [facility]  PATIENT: [name] Short DOB: March 02, 1946  REQUESTING CLINICIAN: Sheryle Carwin, MD HISTORY FROM: Patient/Spouse and Daughter  REASON FOR VISIT: Seizure    HISTORICAL  CHIEF COMPLAINT:  Chief Complaint  Patient presents with   RM13/SEIZURE    Pt is here with his Wife and Daughter. Pt states he was sitting in the hospital waiting for is daughter to go to surgery and then he had a seizure. Negative hx of seizures. Pt states that he is a little out of it. PVC's are elevated. Pt states that his neck down to his shoulders hurt and would like to discuss it.     HISTORY OF PRESENT ILLNESS:  This is a 78 year old gentleman past medical history of history of right eye cancer, prostate cancer, hypertension, hyperlipidemia, GERD, who is presenting after his first lifetime seizure on September 17.  Patient reports that he was at the surgery waiting area, her daughter was having a right knee surgery and while they were waiting he did have a breakthrough seizure.  She does not remember the event but wife tells me that he started by lifting both arms and shaking.  Stated that he was responding to her initially then a few minutes later he had a whole body stiffness, arms lifted up eyes wide open and shaking for about 1 minute.  This was followed by bladder and bowel incontinence.  He did report some tongue soreness not sure if he bite the side of his tongue.  He was taken to the hospital where his workup including head CT was negative. EEG was not done at that time.  He denies previous history of seizures, denies any seizure risk factors, no history of  head trauma Since discharge from the hospital he has not had any additional seizure or seizure-like event.  On top of his seizures, he also complained of chronic cervical spine pain, decreased range of motion and lately has been experiencing headaches mainly in the occipital regions.   Handedness: Right handed   Onset:  06/01/2024  Seizure Type: Generalized convulsion   Current frequency: Only once   Any injuries from seizures: Back pain, tongue biting   Seizure risk factors: Denies  Previous ASMs: None   Currenty ASMs: None   ASMs side effects: N/A   Brain Images: No acute abnormalities   Previous EEGs: Not previously done    OTHER MEDICAL CONDITIONS: History, history of prostate cancer, hypertension, hyperlipidemia, GERD,  REVIEW OF SYSTEMS: Full 14 system review of systems performed and negative with exception of: As noted in the HPI  ALLERGIES: Allergies  Allergen Reactions   Silicone     Caused redness    HOME MEDICATIONS: Outpatient Medications Prior to Visit  Medication Sig Dispense Refill   acetaminophen  (TYLENOL ) 650 MG CR tablet Take 1,300 mg by mouth every morning.     amLODipine  (NORVASC ) 5 MG tablet Take 5 mg by mouth daily.     aspirin  EC 81 MG tablet Take 1 tablet (81 mg total) by mouth daily. 30 tablet 11   atorvastatin (LIPITOR) 20 MG tablet Take 20 mg by mouth 2 (two) times a week. Takes twice a week     cefpodoxime (VANTIN) 200 MG tablet Take 200 mg by mouth 2 (two) times daily.     chlorthalidone  (HYGROTON ) 25 MG tablet Take 25 mg by mouth 2 (two) times a week. Per pt, takes only twice a week.     Cholecalciferol (VITAMIN D) 50 MCG (2000 UT) tablet  Take 2,000 Units by mouth daily.     diphenhydrAMINE  (BENADRYL ) 25 mg capsule Take 25-50 mg by mouth every 6 (six) hours as needed for allergies.     guaiFENesin (MUCINEX) 600 MG 12 hr tablet Take 600 mg by mouth 2 (two) times daily.     losartan  (COZAAR ) 100 MG tablet Take 100 mg by mouth daily.     metoprolol  succinate (TOPROL  XL) 25 MG 24 hr tablet Take 1 tablet (25 mg total) by mouth daily. 90 tablet 3   Omeprazole 20 MG TBEC Take 20 mg by mouth every other day.     triamcinolone  cream (KENALOG ) 0.1 % Apply 1 application  topically daily as needed for rash.     No facility-administered medications prior to visit.     PAST MEDICAL HISTORY: Past Medical History:  Diagnosis Date   Arthritis    ESP IN HANDS   Cancer (HCC)    OCCULAR RIGHT EYE--SURGERY AND CHEMO  PT IS LEGALLY BLIND IN RT EYE   Cataract    LEFT EYE   Chronic kidney disease    Colon cancer (HCC)    COPD (chronic obstructive pulmonary disease) (HCC)    FORMER SMOKER   Erectile dysfunction    GERD (gastroesophageal reflux disease)    Glaucoma    ONLY IN RIGHT EYE   Hypertension    Lung cancer (HCC)    lung ca dx 02/2010   Paresthesia and pain of right extremity    RIGHT SHOULDER    Pneumonia 2011   Prostate cancer (HCC)    prostate ca dx 11/11   Shortness of breath    ONLY WITH EXERTION; HX OF LOBECTOMY FOR CANER   Sleep apnea     PAST SURGICAL HISTORY: Past Surgical History:  Procedure Laterality Date   APPENDECTOMY  02/1999   BACK SURGERY     BIOPSY  12/19/2021   Procedure: BIOPSY;  Surgeon: Golda Claudis PENNER, MD;  Location: [facility];  Service: Endoscopy;;   CARDIAC CATHETERIZATION  2008   indication was due to LOC, chest tightness, diaphoreses; seen at [facility]; PER PATIENT , NORMAL ; no records in epic ; endoreses repeat intermittent sx since time of cath    COLONOSCOPY  08/06/2012   Procedure: COLONOSCOPY;  Surgeon: Claudis PENNER Golda, MD;  [facility]: [facility];  Service: Endoscopy;  Laterality: N/A;  955   COLONOSCOPY N/A 08/08/2015   Procedure: COLONOSCOPY;  Surgeon: Claudis PENNER Golda, MD;  [facility]: [facility];  Service: Endoscopy;  Laterality: N/A;  1200   COLONOSCOPY N/A 10/21/2018   Procedure: COLONOSCOPY;  Surgeon: Golda Claudis PENNER, MD;  [facility]: [facility];  Service: Endoscopy;  Laterality: N/A;  1030   COLONOSCOPY WITH PROPOFOL  N/A 12/19/2021   Procedure: COLONOSCOPY WITH PROPOFOL ;  Surgeon: Golda Claudis PENNER, MD;  [facility]: [facility];  Service: Endoscopy;  Laterality: N/A;  1225   COLONOSCOPY WITH PROPOFOL  N/A 03/10/2023   Procedure: COLONOSCOPY WITH PROPOFOL ;  Surgeon: Eartha Angelia Sieving, MD;  [facility]: [facility];  Service: Gastroenterology;  Laterality: N/A;  10:30am;asa 3   ELBOW SURGERY     EYE SURGERY     OCT 1999 DETACHED RETINA -RIGHT; MAY AND JUNE 2009 RIGHT EYE SURGERY FOR CANCER-LENS HAS BEEN REMOVED.   LEFT UPPER LOBECTOMY  03/2010   FOR CANCER   PENILE PROSTHESIS IMPLANT  08/10/2012   Procedure: PENILE PROTHESIS INFLATABLE;  Surgeon: Norleen JINNY Seltzer, MD;  Location: [facility];  Service: Urology;  Laterality: N/A;  IMPLANT 3 PIECE PENILE PROSTHESIS    POLYPECTOMY  10/21/2018   Procedure: POLYPECTOMY;  Surgeon: Golda Claudis PENNER, MD;  [facility];  Service: Endoscopy;;  colon   POLYPECTOMY  12/19/2021   Procedure: POLYPECTOMY;  Surgeon: Golda Claudis PENNER, MD;  [facility];  Service: Endoscopy;;   POLYPECTOMY  03/10/2023   Procedure: POLYPECTOMY;  Surgeon: Eartha Angelia Sieving, MD;  [facility];  Service: Gastroenterology;;   PROSTATECTOMY  08/2010   FOR CANCER   SUBMUCOSAL LIFTING INJECTION  03/10/2023   Procedure: SUBMUCOSAL LIFTING INJECTION;  Surgeon: Eartha Angelia Sieving, MD;  [facility];  Service: Gastroenterology;;   TOTAL KNEE ARTHROPLASTY Left 09/10/2017   Procedure: LEFT TOTAL KNEE ARTHROPLASTY;  Surgeon: Duwayne Purchase, MD;  Location: [facility];  Service: Orthopedics;  Laterality: Left;  120 mins    FAMILY HISTORY: Family History  Problem Relation Age of Onset   Cancer Mother        Lung   Colon polyps Brother    Colon polyps Brother    Colon polyps Brother    Colon polyps Brother    Cancer Maternal Grandmother        Stomach   Cancer Maternal Grandfather        Stomach   Colon cancer Neg Hx     SOCIAL HISTORY: Social History   Socioeconomic History   Marital status: Married    Spouse name: Not on file   Number of children: 3   Years of education: Not on file   Highest education level: Not on file  Occupational History   Not on file  Tobacco Use   Smoking status:  Former    Current packs/day: 0.00    Average packs/day: 1 pack/day for 45.0 years (45.0 ttl pk-yrs)    Types: Cigarettes    Start date: 09/08/1963    Quit date: 09/07/2008    Years since quitting: 15.7    Passive exposure: Past   Smokeless tobacco: Former    Types: Chew    Quit date: 10/08/1985  Vaping Use   Vaping status: Never Used  Substance and Sexual Activity   Alcohol use: Not Currently    Comment: Drink beer for 30 years, quit 1990s   Drug use: No   Sexual activity: Not on file  Other Topics Concern   Not on file  Social History Narrative   Not on file   Social Drivers of Health   Financial Resource Strain: Not on file  Food Insecurity: Not on file  Transportation Needs: Not on file  Physical Activity: Not on file  Stress: Not on file  Social Connections: Not on file  Intimate Partner Violence: Not on file    PHYSICAL EXAM  GENERAL EXAM/CONSTITUTIONAL: Vitals:  Vitals:   06/08/24 0836  BP: (!) 98/54  Pulse: 67  SpO2: 97%  Weight: 175 lb 8 oz (79.6 kg)  Height: 5' 7 (1.702 m)   Body mass index is 27.49 kg/m. Wt Readings from Last 3 Encounters:  06/08/24 175 lb 8 oz (79.6 kg)  06/06/24 173 lb (78.5 kg)  01/18/24 183 lb 11.2 oz (83.3 kg)   Patient is in no distress; well developed, nourished and groomed; neck is supple  MUSCULOSKELETAL: Gait, strength, tone, movements noted in Neurologic exam below  NEUROLOGIC: MENTAL STATUS:      No data to display         awake, alert, oriented to person, place and time recent  and remote memory intact normal attention and concentration language fluent, comprehension intact, naming intact fund of knowledge appropriate  CRANIAL NERVE:  2nd, 3rd, 4th, 6th - Decrease visual perception with the right eye. Left eye visual fields full to confrontation, extraocular muscles intact, no nystagmus 5th - facial sensation symmetric 7th - facial strength symmetric 8th - hearing intact 9th - palate elevates  symmetrically, uvula midline 11th - shoulder shrug symmetric 12th - tongue protrusion midline  MOTOR:  normal bulk and tone, full strength in the BUE, BLE.  There is limited range of motion with pain of the cervical spine.  There is also tenderness to palpation in the cervical paraspinal muscles.  SENSORY:  normal and symmetric to light touch  COORDINATION:  finger-nose-finger, fine finger movements normal  GAIT/STATION:  normal   DIAGNOSTIC DATA (LABS, IMAGING, TESTING) - I reviewed patient records, labs, notes, testing and imaging myself where available.  Lab Results  Component Value Date   WBC 8.3 06/01/2024   HGB 12.5 (L) 06/01/2024   HCT 36.7 (L) 06/01/2024   MCV 93.9 06/01/2024   PLT 339 06/01/2024      Component Value Date/Time   NA 139 06/01/2024 0744   NA 138 01/30/2016 1144   K 3.9 06/01/2024 0744   K 4.0 01/30/2016 1144   CL 105 06/01/2024 0744   CL 108 (H) 02/01/2013 0758   CO2 19 (L) 06/01/2024 0744   CO2 22 01/30/2016 1144   GLUCOSE 126 (H) 06/01/2024 0744   GLUCOSE 91 01/30/2016 1144   GLUCOSE 106 (H) 02/01/2013 0758   BUN 38 (H) 06/01/2024 0744   BUN 32.7 (H) 01/30/2016 1144   CREATININE 1.51 (H) 06/01/2024 0744   CREATININE 1.54 (H) 02/25/2024 1501   CREATININE 1.4 (H) 01/30/2016 1144   CALCIUM 8.7 (L) 06/01/2024 0744   CALCIUM 9.1 01/30/2016 1144   PROT 7.6 06/01/2024 0744   PROT 7.7 01/30/2016 1144   ALBUMIN 4.0 06/01/2024 0744   ALBUMIN 4.2 01/30/2016 1144   AST 16 06/01/2024 0744   AST 20 02/25/2024 1501   AST 17 01/30/2016 1144   ALT 15 06/01/2024 0744   ALT 18 02/25/2024 1501   ALT 21 01/30/2016 1144   ALKPHOS 70 06/01/2024 0744   ALKPHOS 75 01/30/2016 1144   BILITOT 0.9 06/01/2024 0744   BILITOT 0.7 02/25/2024 1501   BILITOT 0.42 01/30/2016 1144   GFRNONAA 47 (L) 06/01/2024 0744   GFRNONAA 46 (L) 02/25/2024 1501   GFRAA 55 (L) 11/22/2018 2050   Lab Results  Component Value Date   CHOL 154 06/06/2024   HDL 36 (L) 06/06/2024    LDLCALC 96 06/06/2024   TRIG 124 06/06/2024   Lab Results  Component Value Date   HGBA1C 6.1 (H) 06/06/2024   No results found for: VITAMINB12 No results found for: TSH  Head CT 06/01/2024 1. No evidence of acute intracranial abnormality. 2. Mild chronic small vessel ischemic disease.   ASSESSMENT AND PLAN  78 y.o. year old male  with history of right eye cancer, prostate, colon cancer, hypertension, hyperlipidemia, GERD, who is presenting after his first lifetime seizure.  Seizure described as generalized stiffness with eyes rolled back associated with bowel and bladder incontinence.  His initial workup was unrevealing.  Patient was not started on antiseizure medication.  Plan will be to obtain a routine EEG and if normal we will proceed with a 3-day ambulatory EEG.  If any abnormality will likely start patient on medication and obtain MRI brain.  If  studies are normal we will continue to observe patient and he understand to contact me if he does have another seizure.  This was discussed with the patient and family and they are comfortable with plans.   On top of his seizures, he is also complaining of neck pain and decreased range of motion.  This is also causing headache in the occipital region.  Will send patient to physical therapy and obtain MRI cervical spine.  Continue to follow with PCP and return in 6 months or sooner if worse.   1. Seizures (HCC)   2. Cervicalgia   3. Chronic tension-type headache, not intractable   4. Cervical radiculopathy     Patient Instructions  Will obtain routine EEG, if normal we will proceed with a 3-day ambulatory EEG Please contact me if you do have another seizure Please continue your other medications Discussed driving restriction for total of 71-months For your neck pain, will refer you to physical therapy and also obtain a cervical spine MRI. Follow-up in 6 months or sooner if worse   Per [place]  DMV statutes, patients with  seizures are not allowed to drive until they have been seizure-free for six months.  Other recommendations include using caution when using heavy equipment or power tools. Avoid working on ladders or at heights. Take showers instead of baths.  Do not swim alone.  Ensure the water  temperature is not too high on the home water  heater. Do not go swimming alone. Do not lock yourself in a room alone (i.e. bathroom). When caring for infants or small children, sit down when holding, feeding, or changing them to minimize risk of injury to the child in the event you have a seizure. Maintain good sleep hygiene. Avoid alcohol.  Also recommend adequate sleep, hydration, good diet and minimize stress.   During the Seizure  - First, ensure adequate ventilation and place patients on the floor on their left side  Loosen clothing around the neck and ensure the airway is patent. If the patient is clenching the teeth, do not force the mouth open with any object as this can cause severe damage - Remove all items from the surrounding that can be hazardous. The patient may be oblivious to what's happening and may not even know what he or she is doing. If the patient is confused and wandering, either gently guide him/her away and block access to outside areas - Reassure the individual and be comforting - Call 911. In most cases, the seizure ends before EMS arrives. However, there are cases when seizures may last over 3 to 5 minutes. Or the individual may have developed breathing difficulties or severe injuries. If a pregnant patient or a person with diabetes develops a seizure, it is prudent to call an ambulance. - Finally, if the patient does not regain full consciousness, then call EMS. Most patients will remain confused for about 45 to 90 minutes after a seizure, so you must use judgment in calling for help. - Avoid restraints but make sure the patient is in a bed with padded side rails - Place the individual in a lateral  position with the neck slightly flexed; this will help the saliva drain from the mouth and prevent the tongue from falling backward - Remove all nearby furniture and other hazards from the area - Provide verbal assurance as the individual is regaining consciousness - Provide the patient with privacy if possible - Call for help and start treatment as ordered by the caregiver  After the Seizure (Postictal Stage)  After a seizure, most patients experience confusion, fatigue, muscle pain and/or a headache. Thus, one should permit the individual to sleep. For the next few days, reassurance is essential. Being calm and helping reorient the person is also of importance.  Most seizures are painless and end spontaneously. Seizures are not harmful to others but can lead to complications such as stress on the lungs, brain and the heart. Individuals with prior lung problems may develop labored breathing and respiratory distress.    Discussed Patients with epilepsy have a small risk of sudden unexpected death, a condition referred to as sudden unexpected death in epilepsy (SUDEP). SUDEP is defined specifically as the sudden, unexpected, witnessed or unwitnessed, nontraumatic and nondrowning death in patients with epilepsy with or without evidence for a seizure, and excluding documented status epilepticus, in which post mortem examination does not reveal a structural or toxicologic cause for death     Orders Placed This Encounter  Procedures   MR CERVICAL SPINE W CONTRAST   Ambulatory referral to Physical Therapy   EEG adult    No orders of the defined types were placed in this encounter.   Return in about 6 months (around 12/06/2024).    Pastor Falling, MD 06/08/2024, 9:43 PM  [facility] [address] [place], [place] [address] [phone]

## 2024-06-08 NOTE — Patient Instructions (Addendum)
 Will obtain routine EEG, if normal we will proceed with a 3-day ambulatory EEG Please contact me if you do have another seizure Please continue your other medications Discussed driving restriction for total of 45-months For your neck pain, will refer you to physical therapy and also obtain a cervical spine MRI. Follow-up in 6 months or sooner if worse

## 2024-06-09 ENCOUNTER — Telehealth: Payer: Self-pay | Admitting: Neurology

## 2024-06-09 NOTE — Telephone Encounter (Signed)
 no auth required sent to [facility] [phone]

## 2024-06-13 ENCOUNTER — Encounter: Payer: Self-pay | Admitting: Neurology

## 2024-06-14 ENCOUNTER — Ambulatory Visit: Admitting: Neurology

## 2024-06-17 ENCOUNTER — Ambulatory Visit
Admission: RE | Admit: 2024-06-17 | Discharge: 2024-06-17 | Disposition: A | Discharge status: Home / Self Care | Source: Ambulatory Visit | Attending: Neurology | Admitting: Neurology

## 2024-06-17 DIAGNOSIS — M5412 Radiculopathy, cervical region: Secondary | ICD-10-CM

## 2024-06-17 DIAGNOSIS — G44229 Chronic tension-type headache, not intractable: Secondary | ICD-10-CM | POA: Diagnosis not present

## 2024-06-17 DIAGNOSIS — M542 Cervicalgia: Secondary | ICD-10-CM

## 2024-06-17 DIAGNOSIS — I493 Ventricular premature depolarization: Secondary | ICD-10-CM | POA: Diagnosis not present

## 2024-06-17 MED ORDER — GADOPICLENOL 0.5 MMOL/ML IV SOLN
8.0000 mL | Freq: Once | INTRAVENOUS | Status: AC | PRN
  Administered 2024-06-17: 8 mL via INTRAVENOUS

## 2024-06-21 ENCOUNTER — Ambulatory Visit: Payer: Self-pay | Admitting: Neurology

## 2024-06-21 DIAGNOSIS — R569 Unspecified convulsions: Secondary | ICD-10-CM

## 2024-06-29 ENCOUNTER — Ambulatory Visit: Admitting: Neurology

## 2024-06-29 ENCOUNTER — Encounter (INDEPENDENT_AMBULATORY_CARE_PROVIDER_SITE_OTHER): Payer: Self-pay | Admitting: Gastroenterology

## 2024-06-29 DIAGNOSIS — R569 Unspecified convulsions: Secondary | ICD-10-CM | POA: Diagnosis not present

## 2024-06-29 NOTE — Procedures (Signed)
   History:  78 year old man with seizure  EEG classification:  Awake and asleep  Duration: 27 minutes   Technical aspects: This EEG study was done with scalp electrodes positioned according to the 10-20 International system of electrode placement. Electrical activity was reviewed with band pass filter of 1-70Hz , sensitivity of 7 uV/mm, display speed of 15mm/sec with a 60Hz  notched filter applied as appropriate. EEG data were recorded continuously and digitally stored.   Description of the recording: The background rhythms of this recording consists of a fairly well modulated medium amplitude background activity of 11 Hz. As the record progresses, the patient initially is in the waking state, but appears to enter the early stage II sleep during the recording, with rudimentary sleep spindles and vertex sharp wave activity seen. During the wakeful state, photic stimulation was performed, and no abnormal responses were seen. Hyperventilation was not performed. No epileptiform discharges seen during this recording. There was no focal slowing.   Abnormality: None   Impression: This is a normal awake and sleep EEG. No evidence of interictal epileptiform discharges. Normal EEGs, however, do not rule out epilepsy.    Dondre Catalfamo, MD [facility]

## 2024-06-30 ENCOUNTER — Telehealth: Payer: Self-pay

## 2024-06-30 NOTE — Telephone Encounter (Signed)
 [name] Short

## 2024-07-01 NOTE — Therapy (Signed)
 OUTPATIENT PHYSICAL THERAPY CERVICAL EVALUATION   Patient Name: [name] Short MRN: [id number] DOB:[birth year], 78 y.o., male Today's Date: 07/05/2024  END OF SESSION:  PT End of Session - 07/05/24 1317     Visit Number 1    Number of Visits 6    Date for Recertification  08/16/24    Authorization Type HTA    Authorization Time Period no auth needed    PT Start Time 1320    PT Stop Time 1400    PT Time Calculation (min) 40 min    Activity Tolerance Patient tolerated treatment well    Behavior During Therapy WFL for tasks assessed/performed          Past Medical History:  Diagnosis Date   Arthritis    ESP IN HANDS   Cancer (HCC)    OCCULAR RIGHT EYE--SURGERY AND CHEMO  PT IS LEGALLY BLIND IN RT EYE   Cataract    LEFT EYE   Chronic kidney disease    Colon cancer (HCC)    COPD (chronic obstructive pulmonary disease) (HCC)    FORMER SMOKER   Erectile dysfunction    GERD (gastroesophageal reflux disease)    Glaucoma    ONLY IN RIGHT EYE   Hypertension    Lung cancer (HCC)    lung ca dx 02/2010   Paresthesia and pain of right extremity    RIGHT SHOULDER    Pneumonia 2011   Prostate cancer (HCC)    prostate ca dx 11/11   Shortness of breath    ONLY WITH EXERTION; HX OF LOBECTOMY FOR CANER   Sleep apnea    Past Surgical History:  Procedure Laterality Date   APPENDECTOMY  02/1999   BACK SURGERY     BIOPSY  12/19/2021   Procedure: BIOPSY;  Surgeon: Golda Claudis PENNER, MD;  [facility]: [facility];  Service: Endoscopy;;   CARDIAC CATHETERIZATION  2008   indication was due to LOC, chest tightness, diaphoreses; seen at [facility]; PER PATIENT , NORMAL ; no records in epic ; endoreses repeat intermittent sx since time of cath    COLONOSCOPY  08/06/2012   Procedure: COLONOSCOPY;  Surgeon: Claudis PENNER Golda, MD;  [facility];  Service: Endoscopy;  Laterality: N/A;  955   COLONOSCOPY N/A 08/08/2015   Procedure: COLONOSCOPY;  Surgeon: Claudis PENNER Golda, MD;   [facility];  Service: Endoscopy;  Laterality: N/A;  1200   COLONOSCOPY N/A 10/21/2018   Procedure: COLONOSCOPY;  Surgeon: Golda Claudis PENNER, MD;  [facility];  Service: Endoscopy;  Laterality: N/A;  1030   COLONOSCOPY WITH PROPOFOL  N/A 12/19/2021   Procedure: COLONOSCOPY WITH PROPOFOL ;  Surgeon: Golda Claudis PENNER, MD;  [facility];  Service: Endoscopy;  Laterality: N/A;  1225   COLONOSCOPY WITH PROPOFOL  N/A 03/10/2023   Procedure: COLONOSCOPY WITH PROPOFOL ;  Surgeon: Eartha Angelia Sieving, MD;  [facility]: [facility];  Service: Gastroenterology;  Laterality: N/A;  10:30am;asa 3   ELBOW SURGERY     EYE SURGERY     OCT 1999 DETACHED RETINA -RIGHT; MAY AND JUNE 2009 RIGHT EYE SURGERY FOR CANCER-LENS HAS BEEN REMOVED.   LEFT UPPER LOBECTOMY  03/2010   FOR CANCER   PENILE PROSTHESIS IMPLANT  08/10/2012   Procedure: PENILE PROTHESIS INFLATABLE;  Surgeon: Norleen JINNY Seltzer, MD;  Location: [facility];  Service: Urology;  Laterality: N/A;  IMPLANT 3 PIECE PENILE PROSTHESIS    POLYPECTOMY  10/21/2018   Procedure: POLYPECTOMY;  Surgeon: Golda,  Claudis PENNER, MD;  [facility];  Service: Endoscopy;;  colon   POLYPECTOMY  12/19/2021   Procedure: POLYPECTOMY;  Surgeon: Golda Claudis PENNER, MD;  [facility];  Service: Endoscopy;;   POLYPECTOMY  03/10/2023   Procedure: POLYPECTOMY;  Surgeon: Eartha Angelia Sieving, MD;  [facility];  Service: Gastroenterology;;   PROSTATECTOMY  08/2010   FOR CANCER   SUBMUCOSAL LIFTING INJECTION  03/10/2023   Procedure: SUBMUCOSAL LIFTING INJECTION;  Surgeon: Eartha Angelia Sieving, MD;  [facility];  Service: Gastroenterology;;   TOTAL KNEE ARTHROPLASTY Left 09/10/2017   Procedure: LEFT TOTAL KNEE ARTHROPLASTY;  Surgeon: Duwayne Purchase, MD;  Location: [facility];  Service: Orthopedics;  Laterality: Left;  120 mins   Patient Active Problem List   Diagnosis Date Noted   Cervical spondylosis 12/09/2023    Pain in joint of left shoulder 10/26/2023   Pain in joint of right shoulder 10/26/2023   Mass of wrist 06/16/2023   History of colon cancer 03/10/2023   Cecal cancer (HCC) 01/15/2023   Colon cancer (HCC) 03/06/2022   History of colonic polyps 08/05/2018   History of total knee arthroplasty 12/08/2017   Pain in left knee 09/21/2017   Left knee DJD 09/10/2017   Preoperative cardiovascular examination 09/07/2017   Ex-smoker 09/07/2017   Essential (primary) hypertension 12/23/2012   HLD (hyperlipidemia) 12/23/2012   Primary malignant neoplasm of prostate (HCC) 12/23/2012   SCC of lung (small cell carcinoma) (HCC) 12/23/2012   Abnormal glucose level 12/23/2012   Esophagitis, reflux 12/23/2012   Glaucoma 12/23/2012   Primary cancer of eye (HCC) 12/23/2012   Organic erectile dysfunction 08/11/2012   Cancer of left lung (HCC) 04/27/2012    PCP: Sheryle Carwin, MD  REFERRING PROVIDER: Gregg Lek, MD  REFERRING DIAG: M54.2 (ICD-10-CM) - Cervicalgia G44.229 (ICD-10-CM) - Chronic tension-type headache, not intractable  THERAPY DIAG:  Neck pain  Chronic intractable headache, unspecified headache type  Neck stiffness  Rationale for Evaluation and Treatment: Rehabilitation  ONSET DATE: several months  SUBJECTIVE:                                                                                                                                                                                                         SUBJECTIVE STATEMENT: Pain in neck and into upper traps and up into the back of his head; bothers him driving turning his head; had an MRI; will be having an EEG; states he did have a siezure back in early Sept. Seems like his heart rate and blood pressure was  jumping around; he may have hit his head a little bit Hand dominance: Right  PERTINENT HISTORY:  Arthritis in hands Left TKA 2019 Assists his wife with at home dialysis History of cancer 4 times; in remission  PAIN:   Are you having pain? Yes: NPRS scale: 3-4/10;  Pain location: neck, back of head and upper traps Pain description: constant Aggravating factors: sitting and looking down Relieving factors: massage, tylenol   PRECAUTIONS: None  RED FLAGS: None     WEIGHT BEARING RESTRICTIONS: No  FALLS:  Has patient fallen in last 6 months? No  OCCUPATION: retired   PLOF: Independent  PATIENT GOALS: not to have neck pain  NEXT MD VISIT: PRN  OBJECTIVE:  Note: Objective measures were completed at Evaluation unless otherwise noted.  DIAGNOSTIC FINDINGS:  STUDY DATE: 06/17/2024 PATIENT NAME: [name] Short DOB: 18-Jun-1946 MRN: [id number]   EXAM: MRI of the cervical spine with and without contrast   ORDERING CLINICIAN: Pastor Falling, MD CLINICAL HISTORY: 78 year old man with neck pain, tension headache and cervical radiculopathy COMPARISON FILMS: None   TECHNIQUE: MRI of the cervical spine was obtained utilizing 3 mm sagittal slices from the posterior fossa down to the T3-4 level with T1, T2 and inversion recovery views. In addition 4 mm axial slices from C2-3 down to T1-2 level were included with T2 and gradient echo views.  After the infusion contrast, additional T1-weighted images were performed. CONTRAST: 8 mL [employer] IMAGING SITE: [facility], [place]    FINDINGS: :  On sagittal images, the spine is imaged from above the cervicomedullary junction to T3.   The visible brain appears normal.  The cervicomedullary junction of appears normal.  The spinal cord has normal caliber and signal.  The vertebral bodies are normally aligned.   The marrow shows endplate degenerative changes and some edema and minimal enhancement at C5-C6 associated with severe loss of disc height.  Otherwise, vertebral bodies have normal signal.  The atlantoaxial and atlantooccipital joints appear normal.   The discs and interspaces were further evaluated on axial views from C1 to T2 as follows:    C1-C2: This level is unremarkable.   C2-C3: There is a small right disc osteophyte complex causing mild right foraminal narrowing but no spinal stenosis or nerve root compression.   C3-C4: There is disc bulging and uncovertebral spurring and mild left facet hypertrophy causing mild to moderate left and mild right foraminal narrowing but no spinal stenosis or nerve root compression.   C4-C5: There are small disc osteophyte complexes bilaterally, mild facet hypertrophy and mild ligamenta flava hypertrophy.  This causes moderate left and mild right foraminal narrowing but no spinal stenosis or nerve root compression.   C5-C6: There is severe loss of disc height associated with endplate degenerative changes, edema and minimal enhancement.  There there is disc protrusion associated with left greater than right disc osteophyte complexes and mild ligamentum flavum hypertrophy.  This causes mild spinal stenosis, moderately severe left foraminal narrowing and moderate right foraminal narrowing.  There is potential for left C6 nerve root compression.   C6-C7: There is mild disc bulging and mild ligamenta flava hypertrophy causing minimal foraminal narrowing but no spinal stenosis or nerve root compression.   C7-T1: This level is unremarkable.   T1-T2: This level is unremarkable.       IMPRESSION:   This MRI of the cervical spine with and without contrast shows the following: The spinal cord appears normal. At C5-C6, there is severe loss of  disc height and endplate degenerative changes associated with edema and mild enhancement.  These degenerative changes could lead to neck pain.  Additionally, there is mild spinal stenosis and moderately severe left and moderate right foraminal narrowing.  There is potential for left C6 nerve root compression. There are milder degenerative changes with various degrees of foraminal narrowing at other cervical levels not leading to spinal stenosis or nerve root  compression.       INTERPRETING PHYSICIAN:  Richard A. Vear, MD, PhD, FAAN Certified in  Neuroimaging by [employer]  PATIENT SURVEYS:  NDI:  NECK DISABILITY INDEX  Date: 07/05/2024 Score                                Total 12/50; 24%   Minimum Detectable Change (90% confidence): 5 points or 10% points  COGNITION: Overall cognitive status: Within functional limits for tasks assessed  SENSATION: WFL  POSTURE: rounded shoulders and forward head  PALPATION: Tight bilateral upper traps right > left   CERVICAL ROM:   Active ROM AROM (deg) eval  Flexion 17  Extension 53  Right lateral flexion 26  Left lateral flexion 25  Right rotation 44*  Left rotation 34*   (Blank rows = not tested)  UPPER EXTREMITY ROM:  Active ROM Right eval Left eval  Shoulder flexion wfl wfl  Shoulder extension    Shoulder abduction    Shoulder adduction    Shoulder extension    Shoulder internal rotation    Shoulder external rotation    Elbow flexion    Elbow extension    Wrist flexion    Wrist extension    Wrist ulnar deviation    Wrist radial deviation    Wrist pronation    Wrist supination     (Blank rows = not tested)  UPPER EXTREMITY MMT:  MMT Right eval Left eval  Shoulder flexion 4+ 4+  Shoulder extension    Shoulder abduction    Shoulder adduction    Shoulder extension    Shoulder internal rotation    Shoulder external rotation    Middle trapezius    Lower trapezius    Elbow flexion 5 5  Elbow extension 5 5  Wrist flexion    Wrist extension    Wrist ulnar deviation    Wrist radial deviation    Wrist pronation    Wrist supination    Grip strength     (Blank rows = not tested)    TREATMENT DATE: 07/05/24 physical therapy evaluation and HEP instruction, dry needling instructions; STM to bilateral upper traps x 5'                                                                                                                                 PATIENT EDUCATION:  Education details: Patient educated on exam findings, POC, scope of PT, HEP  and dry needling instructions. Person educated: Patient Education method: Explanation, Demonstration, and Handouts Education comprehension: verbalized understanding, returned demonstration, verbal cues required, and tactile cues required  HOME EXERCISE PROGRAM: Access Code: [email or web address] URL: [email or web address] Date: 07/05/2024 Prepared by: [facility]  Exercises - Seated Gentle Upper Trapezius Stretch  - 2 x daily - 7 x weekly - 1 sets - 5 reps - 20 sec hold - Seated Assisted Cervical Rotation with Towel  - 2 x daily - 7 x weekly - 1 sets - 5 reps - 20 sec hold  ASSESSMENT:  CLINICAL IMPRESSION: Patient is a 78 y.o. male who was seen today for physical therapy evaluation and treatment for M54.2 (ICD-10-CM) - Cervicalgia G44.229 (ICD-10-CM) - Chronic tension-type headache, not intractable. Patient demonstrates decreased strength, ROM restriction, reduced flexibility, increased tenderness to palpation and postural abnormalities which are likely contributing to symptoms of pain and are negatively impacting patient ability to perform ADLs. Patient will benefit from skilled physical therapy services to address these deficits to reduce pain and improve level of function with ADLs   OBJECTIVE IMPAIRMENTS: decreased activity tolerance, decreased mobility, decreased ROM, decreased strength, increased fascial restrictions, postural dysfunction, and pain.   ACTIVITY LIMITATIONS: bending, sitting, reach over head, and caring for others  PARTICIPATION LIMITATIONS: meal prep and cleaning, reading  REHAB POTENTIAL: Good  CLINICAL DECISION MAKING: Evolving/moderate complexity  EVALUATION COMPLEXITY: Moderate   GOALS: Goals reviewed with patient? No  SHORT TERM GOALS: Target date: 07/26/2024  patient will be independent with initial HEP and compliant with HEP 3-4 times  a week   Baseline:  Goal status: INITIAL  2.  Patient will report 50% improvement overall  Baseline:  Goal status: INITIAL   LONG TERM GOALS: Target date: 08/16/2024  Patient will be independent in self management strategies to improve quality of life and functional outcomes.  Baseline:  Goal status: INITIAL  2.  Patient will report 70% improvement overall  Baseline:  Goal status: INITIAL  3.  Patient will increase cervical mobility by 30 degrees throughout to improve ability to scan for safety with driving  Baseline: see above Goal status: INITIAL  4.  Patient will read x 30 min without neck pain Baseline:  Goal status: INITIAL  5.  Patient will improve NDI score by  5 points to demonstrate improved perceived function  Baseline: 12/50 Goal status: INITIAL   PLAN:  PT FREQUENCY: 1x/week  PT DURATION: 6 weeks  PLANNED INTERVENTIONS: [id number]- PT Re-evaluation, 97110-Therapeutic exercises, 97530- Therapeutic activity, 97112- Neuromuscular re-education, 97535- Self Care, [id number]- Manual therapy, [id number]- Gait training, [phone]- Orthotic Fit/training, [phone]- Canalith repositioning, [id number]- Aquatic Therapy, [phone]- Splinting, [phone]- Wound care (first 20 sq cm), 97598- Wound care (each additional 20 sq cm)Patient/Family education, Balance training, Stair training, Taping, Dry Needling, Joint mobilization, Joint manipulation, Spinal manipulation, Spinal mobilization, Scar mobilization, and DME instructions.   PLAN FOR NEXT SESSION: Review HEP and goals; dry needling as appropriate; STM; cervical mobility and postural strengthening   2:18 PM, 07/05/24 Aubrina Nieman Small Suhayla Chisom MPT [facility] [place] [phone] Ph:[phone]

## 2024-07-05 ENCOUNTER — Ambulatory Visit (HOSPITAL_COMMUNITY): Attending: Neurology

## 2024-07-05 ENCOUNTER — Other Ambulatory Visit: Payer: Self-pay

## 2024-07-05 DIAGNOSIS — R519 Headache, unspecified: Secondary | ICD-10-CM | POA: Diagnosis not present

## 2024-07-05 DIAGNOSIS — M542 Cervicalgia: Secondary | ICD-10-CM | POA: Diagnosis not present

## 2024-07-05 DIAGNOSIS — G8929 Other chronic pain: Secondary | ICD-10-CM | POA: Diagnosis not present

## 2024-07-05 DIAGNOSIS — M436 Torticollis: Secondary | ICD-10-CM | POA: Diagnosis not present

## 2024-07-05 DIAGNOSIS — G44229 Chronic tension-type headache, not intractable: Secondary | ICD-10-CM | POA: Insufficient documentation

## 2024-07-05 NOTE — Patient Instructions (Signed)

## 2024-07-07 DIAGNOSIS — Z23 Encounter for immunization: Secondary | ICD-10-CM | POA: Diagnosis not present

## 2024-07-15 DIAGNOSIS — Z85038 Personal history of other malignant neoplasm of large intestine: Secondary | ICD-10-CM | POA: Diagnosis not present

## 2024-07-15 DIAGNOSIS — C61 Malignant neoplasm of prostate: Secondary | ICD-10-CM | POA: Diagnosis not present

## 2024-07-15 DIAGNOSIS — C7800 Secondary malignant neoplasm of unspecified lung: Secondary | ICD-10-CM | POA: Diagnosis not present

## 2024-07-15 DIAGNOSIS — I493 Ventricular premature depolarization: Secondary | ICD-10-CM | POA: Diagnosis not present

## 2024-07-15 DIAGNOSIS — E785 Hyperlipidemia, unspecified: Secondary | ICD-10-CM | POA: Diagnosis not present

## 2024-07-15 DIAGNOSIS — I700 Atherosclerosis of aorta: Secondary | ICD-10-CM | POA: Diagnosis not present

## 2024-07-15 DIAGNOSIS — Z79899 Other long term (current) drug therapy: Secondary | ICD-10-CM | POA: Diagnosis not present

## 2024-07-21 DIAGNOSIS — I2510 Atherosclerotic heart disease of native coronary artery without angina pectoris: Secondary | ICD-10-CM | POA: Diagnosis not present

## 2024-07-21 DIAGNOSIS — I10 Essential (primary) hypertension: Secondary | ICD-10-CM | POA: Diagnosis not present

## 2024-07-21 DIAGNOSIS — C3411 Malignant neoplasm of upper lobe, right bronchus or lung: Secondary | ICD-10-CM | POA: Diagnosis not present

## 2024-07-21 DIAGNOSIS — Z0001 Encounter for general adult medical examination with abnormal findings: Secondary | ICD-10-CM | POA: Diagnosis not present

## 2024-07-21 DIAGNOSIS — E785 Hyperlipidemia, unspecified: Secondary | ICD-10-CM | POA: Diagnosis not present

## 2024-07-21 DIAGNOSIS — R7301 Impaired fasting glucose: Secondary | ICD-10-CM | POA: Diagnosis not present

## 2024-07-21 DIAGNOSIS — N183 Chronic kidney disease, stage 3 unspecified: Secondary | ICD-10-CM | POA: Diagnosis not present

## 2024-07-21 DIAGNOSIS — Z8546 Personal history of malignant neoplasm of prostate: Secondary | ICD-10-CM | POA: Diagnosis not present

## 2024-07-21 DIAGNOSIS — Z85038 Personal history of other malignant neoplasm of large intestine: Secondary | ICD-10-CM | POA: Diagnosis not present

## 2024-07-21 DIAGNOSIS — G40001 Localization-related (focal) (partial) idiopathic epilepsy and epileptic syndromes with seizures of localized onset, not intractable, with status epilepticus: Secondary | ICD-10-CM | POA: Diagnosis not present

## 2024-07-21 DIAGNOSIS — I493 Ventricular premature depolarization: Secondary | ICD-10-CM | POA: Diagnosis not present

## 2024-07-25 ENCOUNTER — Ambulatory Visit (HOSPITAL_COMMUNITY): Admission: RE | Admit: 2024-07-25 | Discharge: 2024-07-25 | Disposition: A | Source: Ambulatory Visit

## 2024-07-25 ENCOUNTER — Ambulatory Visit (HOSPITAL_COMMUNITY)
Admission: RE | Admit: 2024-07-25 | Discharge: 2024-07-25 | Disposition: A | Discharge status: Home / Self Care | Source: Ambulatory Visit

## 2024-07-25 DIAGNOSIS — E785 Hyperlipidemia, unspecified: Secondary | ICD-10-CM | POA: Insufficient documentation

## 2024-07-25 DIAGNOSIS — I77810 Thoracic aortic ectasia: Secondary | ICD-10-CM | POA: Diagnosis not present

## 2024-07-25 DIAGNOSIS — I700 Atherosclerosis of aorta: Secondary | ICD-10-CM | POA: Insufficient documentation

## 2024-07-25 DIAGNOSIS — I351 Nonrheumatic aortic (valve) insufficiency: Secondary | ICD-10-CM | POA: Diagnosis not present

## 2024-07-25 DIAGNOSIS — I10 Essential (primary) hypertension: Secondary | ICD-10-CM | POA: Diagnosis not present

## 2024-07-25 DIAGNOSIS — Z87891 Personal history of nicotine dependence: Secondary | ICD-10-CM | POA: Insufficient documentation

## 2024-07-25 DIAGNOSIS — Z136 Encounter for screening for cardiovascular disorders: Secondary | ICD-10-CM | POA: Diagnosis not present

## 2024-07-25 DIAGNOSIS — Z139 Encounter for screening, unspecified: Secondary | ICD-10-CM | POA: Insufficient documentation

## 2024-07-25 DIAGNOSIS — I493 Ventricular premature depolarization: Secondary | ICD-10-CM

## 2024-07-25 LAB — ECHOCARDIOGRAM COMPLETE
AR max vel: 1.18 cm2
AV Area VTI: 1.17 cm2
AV Area mean vel: 1.11 cm2
AV Mean grad: 5.5 mmHg
AV Peak grad: 10.7 mmHg
AV Vena cont: 0.22 cm
Ao pk vel: 1.64 m/s
P 1/2 time: 1000 ms
S' Lateral: 2.76 cm

## 2024-07-25 NOTE — Telephone Encounter (Signed)
 [facility] w/ [facility] ([facility]) asked for updated clinicals/rationale. I have faxed the 06/29/24 EEG result and the OV from 06/08/24 to [facility]. Received a receipt of confirmation.

## 2024-07-27 ENCOUNTER — Ambulatory Visit (HOSPITAL_COMMUNITY): Attending: Neurology

## 2024-07-27 DIAGNOSIS — M436 Torticollis: Secondary | ICD-10-CM | POA: Insufficient documentation

## 2024-07-27 DIAGNOSIS — R519 Headache, unspecified: Secondary | ICD-10-CM | POA: Insufficient documentation

## 2024-07-27 DIAGNOSIS — G8929 Other chronic pain: Secondary | ICD-10-CM | POA: Diagnosis not present

## 2024-07-27 DIAGNOSIS — M542 Cervicalgia: Secondary | ICD-10-CM | POA: Diagnosis not present

## 2024-07-27 NOTE — Therapy (Signed)
 OUTPATIENT PHYSICAL THERAPY CERVICAL TREATMENT   Patient Name: [name] Short MRN: [id number] DOB:1946-04-15, 78 y.o., male Today's Date: 07/27/2024  END OF SESSION:  PT End of Session - 07/27/24 1330     Visit Number 2    Number of Visits 6    Date for Recertification  08/16/24    Authorization Type HTA    Authorization Time Period no auth needed    PT Start Time 1330    PT Stop Time 1410    PT Time Calculation (min) 40 min    Activity Tolerance Patient tolerated treatment well    Behavior During Therapy WFL for tasks assessed/performed          Past Medical History:  Diagnosis Date   Arthritis    ESP IN HANDS   Cancer (HCC)    OCCULAR RIGHT EYE--SURGERY AND CHEMO  PT IS LEGALLY BLIND IN RT EYE   Cataract    LEFT EYE   Chronic kidney disease    Colon cancer (HCC)    COPD (chronic obstructive pulmonary disease) (HCC)    FORMER SMOKER   Erectile dysfunction    GERD (gastroesophageal reflux disease)    Glaucoma    ONLY IN RIGHT EYE   Hypertension    Lung cancer (HCC)    lung ca dx 02/2010   Paresthesia and pain of right extremity    RIGHT SHOULDER    Pneumonia 2011   Prostate cancer (HCC)    prostate ca dx 11/11   Shortness of breath    ONLY WITH EXERTION; HX OF LOBECTOMY FOR CANER   Sleep apnea    Past Surgical History:  Procedure Laterality Date   APPENDECTOMY  02/1999   BACK SURGERY     BIOPSY  12/19/2021   Procedure: BIOPSY;  Surgeon: Golda Claudis PENNER, MD;  [facility]: [facility];  Service: Endoscopy;;   CARDIAC CATHETERIZATION  2008   indication was due to LOC, chest tightness, diaphoreses; seen at [facility]; PER PATIENT , NORMAL ; no records in epic ; endoreses repeat intermittent sx since time of cath    COLONOSCOPY  08/06/2012   Procedure: COLONOSCOPY;  Surgeon: Claudis PENNER Golda, MD;  [facility];  Service: Endoscopy;  Laterality: N/A;  955   COLONOSCOPY N/A 08/08/2015   Procedure: COLONOSCOPY;  Surgeon: Claudis PENNER Golda, MD;   [facility];  Service: Endoscopy;  Laterality: N/A;  1200   COLONOSCOPY N/A 10/21/2018   Procedure: COLONOSCOPY;  Surgeon: Golda Claudis PENNER, MD;  [facility];  Service: Endoscopy;  Laterality: N/A;  1030   COLONOSCOPY WITH PROPOFOL  N/A 12/19/2021   Procedure: COLONOSCOPY WITH PROPOFOL ;  Surgeon: Golda Claudis PENNER, MD;  [facility];  Service: Endoscopy;  Laterality: N/A;  1225   COLONOSCOPY WITH PROPOFOL  N/A 03/10/2023   Procedure: COLONOSCOPY WITH PROPOFOL ;  Surgeon: Eartha Angelia Sieving, MD;  [facility];  Service: Gastroenterology;  Laterality: N/A;  10:30am;asa 3   ELBOW SURGERY     EYE SURGERY     OCT 1999 DETACHED RETINA -RIGHT; MAY AND JUNE 2009 RIGHT EYE SURGERY FOR CANCER-LENS HAS BEEN REMOVED.   LEFT UPPER LOBECTOMY  03/2010   FOR CANCER   PENILE PROSTHESIS IMPLANT  08/10/2012   Procedure: PENILE PROTHESIS INFLATABLE;  Surgeon: Norleen JINNY Seltzer, MD;  Location: [facility];  Service: Urology;  Laterality: N/A;  IMPLANT 3 PIECE PENILE PROSTHESIS    POLYPECTOMY  10/21/2018   Procedure: POLYPECTOMY;  Surgeon: Golda,  Claudis PENNER, MD;  [facility];  Service: Endoscopy;;  colon   POLYPECTOMY  12/19/2021   Procedure: POLYPECTOMY;  Surgeon: Golda Claudis PENNER, MD;  [facility];  Service: Endoscopy;;   POLYPECTOMY  03/10/2023   Procedure: POLYPECTOMY;  Surgeon: Eartha Angelia Sieving, MD;  [facility];  Service: Gastroenterology;;   PROSTATECTOMY  08/2010   FOR CANCER   SUBMUCOSAL LIFTING INJECTION  03/10/2023   Procedure: SUBMUCOSAL LIFTING INJECTION;  Surgeon: Eartha Angelia Sieving, MD;  [facility];  Service: Gastroenterology;;   TOTAL KNEE ARTHROPLASTY Left 09/10/2017   Procedure: LEFT TOTAL KNEE ARTHROPLASTY;  Surgeon: Duwayne Purchase, MD;  Location: [facility];  Service: Orthopedics;  Laterality: Left;  120 mins   Patient Active Problem List   Diagnosis Date Noted   Cervical spondylosis 12/09/2023    Pain in joint of left shoulder 10/26/2023   Pain in joint of right shoulder 10/26/2023   Mass of wrist 06/16/2023   History of colon cancer 03/10/2023   Cecal cancer (HCC) 01/15/2023   Colon cancer (HCC) 03/06/2022   History of colonic polyps 08/05/2018   History of total knee arthroplasty 12/08/2017   Pain in left knee 09/21/2017   Left knee DJD 09/10/2017   Preoperative cardiovascular examination 09/07/2017   Ex-smoker 09/07/2017   Essential (primary) hypertension 12/23/2012   HLD (hyperlipidemia) 12/23/2012   Primary malignant neoplasm of prostate (HCC) 12/23/2012   SCC of lung (small cell carcinoma) (HCC) 12/23/2012   Abnormal glucose level 12/23/2012   Esophagitis, reflux 12/23/2012   Glaucoma 12/23/2012   Primary cancer of eye (HCC) 12/23/2012   Organic erectile dysfunction 08/11/2012   Cancer of left lung (HCC) 04/27/2012    PCP: Sheryle Carwin, MD  REFERRING PROVIDER: Gregg Lek, MD  REFERRING DIAG: M54.2 (ICD-10-CM) - Cervicalgia G44.229 (ICD-10-CM) - Chronic tension-type headache, not intractable  THERAPY DIAG:  Neck pain  Chronic intractable headache, unspecified headache type  Neck stiffness  Rationale for Evaluation and Treatment: Rehabilitation  ONSET DATE: several months  SUBJECTIVE:                                                                                                                                                                                                         SUBJECTIVE STATEMENT: EEG postponed to 2 more weeks to follow up on seizure.  Otherwise doing stretching from HEP but minimal change  EVAL:Pain in neck and into upper traps and up into the back of his head; bothers him driving turning his head; had an MRI; will be having  an EEG; states he did have a siezure back in early Sept. Seems like his heart rate and blood pressure was jumping around; he may have hit his head a little bit Hand dominance: Right  PERTINENT HISTORY:   Arthritis in hands Left TKA 2019 Assists his wife with at home dialysis History of cancer 4 times; in remission  PAIN:  Are you having pain? Yes: NPRS scale: 3-4/10;  Pain location: neck, back of head and upper traps Pain description: constant Aggravating factors: sitting and looking down Relieving factors: massage, tylenol   PRECAUTIONS: None  RED FLAGS: None     WEIGHT BEARING RESTRICTIONS: No  FALLS:  Has patient fallen in last 6 months? No  OCCUPATION: retired   PLOF: Independent  PATIENT GOALS: not to have neck pain  NEXT MD VISIT: PRN  OBJECTIVE:  Note: Objective measures were completed at Evaluation unless otherwise noted.  DIAGNOSTIC FINDINGS:  STUDY DATE: 06/17/2024 PATIENT NAME: TAREZ BOWNS DOB: [birth year] MRN: [id number]   EXAM: MRI of the cervical spine with and without contrast   ORDERING CLINICIAN: Pastor Falling, MD CLINICAL HISTORY: 78 year old man with neck pain, tension headache and cervical radiculopathy COMPARISON FILMS: None   TECHNIQUE: MRI of the cervical spine was obtained utilizing 3 mm sagittal slices from the posterior fossa down to the T3-4 level with T1, T2 and inversion recovery views. In addition 4 mm axial slices from C2-3 down to T1-2 level were included with T2 and gradient echo views.  After the infusion contrast, additional T1-weighted images were performed. CONTRAST: 8 mL [employer] IMAGING SITE: [facility], [place]    FINDINGS: :  On sagittal images, the spine is imaged from above the cervicomedullary junction to T3.   The visible brain appears normal.  The cervicomedullary junction of appears normal.  The spinal cord has normal caliber and signal.  The vertebral bodies are normally aligned.   The marrow shows endplate degenerative changes and some edema and minimal enhancement at C5-C6 associated with severe loss of disc height.  Otherwise, vertebral bodies have normal signal.  The atlantoaxial and  atlantooccipital joints appear normal.   The discs and interspaces were further evaluated on axial views from C1 to T2 as follows:   C1-C2: This level is unremarkable.   C2-C3: There is a small right disc osteophyte complex causing mild right foraminal narrowing but no spinal stenosis or nerve root compression.   C3-C4: There is disc bulging and uncovertebral spurring and mild left facet hypertrophy causing mild to moderate left and mild right foraminal narrowing but no spinal stenosis or nerve root compression.   C4-C5: There are small disc osteophyte complexes bilaterally, mild facet hypertrophy and mild ligamenta flava hypertrophy.  This causes moderate left and mild right foraminal narrowing but no spinal stenosis or nerve root compression.   C5-C6: There is severe loss of disc height associated with endplate degenerative changes, edema and minimal enhancement.  There there is disc protrusion associated with left greater than right disc osteophyte complexes and mild ligamentum flavum hypertrophy.  This causes mild spinal stenosis, moderately severe left foraminal narrowing and moderate right foraminal narrowing.  There is potential for left C6 nerve root compression.   C6-C7: There is mild disc bulging and mild ligamenta flava hypertrophy causing minimal foraminal narrowing but no spinal stenosis or nerve root compression.   C7-T1: This level is unremarkable.   T1-T2: This level is unremarkable.       IMPRESSION:   This MRI of the  cervical spine with and without contrast shows the following: The spinal cord appears normal. At C5-C6, there is severe loss of disc height and endplate degenerative changes associated with edema and mild enhancement.  These degenerative changes could lead to neck pain.  Additionally, there is mild spinal stenosis and moderately severe left and moderate right foraminal narrowing.  There is potential for left C6 nerve root compression. There are milder  degenerative changes with various degrees of foraminal narrowing at other cervical levels not leading to spinal stenosis or nerve root compression.       INTERPRETING PHYSICIAN:  Richard A. Vear, MD, PhD, FAAN Certified in  Neuroimaging by [employer]  PATIENT SURVEYS:  NDI:  NECK DISABILITY INDEX  Date: 07/05/2024 Score                                Total 12/50; 24%   Minimum Detectable Change (90% confidence): 5 points or 10% points  COGNITION: Overall cognitive status: Within functional limits for tasks assessed  SENSATION: WFL  POSTURE: rounded shoulders and forward head  PALPATION: Tight bilateral upper traps right > left   CERVICAL ROM:   Active ROM AROM (deg) eval  Flexion 17  Extension 53  Right lateral flexion 26  Left lateral flexion 25  Right rotation 44*  Left rotation 34*   (Blank rows = not tested)  UPPER EXTREMITY ROM:  Active ROM Right eval Left eval  Shoulder flexion wfl wfl  Shoulder extension    Shoulder abduction    Shoulder adduction    Shoulder extension    Shoulder internal rotation    Shoulder external rotation    Elbow flexion    Elbow extension    Wrist flexion    Wrist extension    Wrist ulnar deviation    Wrist radial deviation    Wrist pronation    Wrist supination     (Blank rows = not tested)  UPPER EXTREMITY MMT:  MMT Right eval Left eval  Shoulder flexion 4+ 4+  Shoulder extension    Shoulder abduction    Shoulder adduction    Shoulder extension    Shoulder internal rotation    Shoulder external rotation    Middle trapezius    Lower trapezius    Elbow flexion 5 5  Elbow extension 5 5  Wrist flexion    Wrist extension    Wrist ulnar deviation    Wrist radial deviation    Wrist pronation    Wrist supination    Grip strength     (Blank rows = not tested)    TREATMENT DATE: 07/27/24 Review of HEP and goals with moist heat x 5' Seated upper trap stretch 5 x 10  each Seated cervical rotation stretch with towel x 5 each Manual to find trigger points Trigger Point Dry Needling  Initial Treatment: Pt instructed on Dry Needling rational, procedures, and possible side effects. Pt instructed to expect mild to moderate muscle soreness later in the day and/or into the next day.  Pt instructed in methods to reduce muscle soreness. Pt instructed to continue prescribed HEP. Because Dry Needling was performed over or adjacent to a lung field, pt was educated on S/S of pneumothorax and to seek immediate medical attention should they occur.  Patient was educated on signs and symptoms of infection and other risk factors and advised to seek medical attention should they occur.  Patient verbalized  understanding of these instructions and education.   Patient Verbal Consent Given: Yes Education Handout Provided: Previously Provided Muscles Treated:  bilateral upper traps Electrical Stimulation Performed: No Treatment Response/Outcome: multiple twitches; decreased tightness muscle  STM to cervical spine and upper traps to end treatment x 5'       07/05/24 physical therapy evaluation and HEP instruction, dry needling instructions; STM to bilateral upper traps x 5'                                                                                                                                PATIENT EDUCATION:  Education details: Patient educated on exam findings, POC, scope of PT, HEP and dry needling instructions. Person educated: Patient Education method: Explanation, Demonstration, and Handouts Education comprehension: verbalized understanding, returned demonstration, verbal cues required, and tactile cues required  HOME EXERCISE PROGRAM: Access Code: [email or web address] URL: [email or web address] Date: 07/05/2024 Prepared by: [facility]  Exercises - Seated Gentle Upper Trapezius Stretch  - 2 x daily - 7 x weekly - 1 sets - 5 reps - 20 sec hold -  Seated Assisted Cervical Rotation with Towel  - 2 x daily - 7 x weekly - 1 sets - 5 reps - 20 sec hold  ASSESSMENT:  CLINICAL IMPRESSION: Today's session started with a review of HEP and goals; patient verbalizes agreement with set rehab goals; sitting with moist heat.  Patient needs review for technique with cervical rotation.  Manual to find trigger points followed by dry needling initial treatment today to bilateral upper traps; multiple twitches noted with left side more tight than right.  After needling session patient reports decreased pain and tightness overall right > left trap.  Ended session with manual STM to bilateral upper traps and encouraged to continue with stretching after DN session.  Patient will benefit from continued skilled therapy services to address deficits and promote return to optimal function.       Eval: Patient is a 78 y.o. male who was seen today for physical therapy evaluation and treatment for M54.2 (ICD-10-CM) - Cervicalgia G44.229 (ICD-10-CM) - Chronic tension-type headache, not intractable. Patient demonstrates decreased strength, ROM restriction, reduced flexibility, increased tenderness to palpation and postural abnormalities which are likely contributing to symptoms of pain and are negatively impacting patient ability to perform ADLs. Patient will benefit from skilled physical therapy services to address these deficits to reduce pain and improve level of function with ADLs   OBJECTIVE IMPAIRMENTS: decreased activity tolerance, decreased mobility, decreased ROM, decreased strength, increased fascial restrictions, postural dysfunction, and pain.   ACTIVITY LIMITATIONS: bending, sitting, reach over head, and caring for others  PARTICIPATION LIMITATIONS: meal prep and cleaning, reading  REHAB POTENTIAL: Good  CLINICAL DECISION MAKING: Evolving/moderate complexity  EVALUATION COMPLEXITY: Moderate   GOALS: Goals reviewed with patient? No  SHORT TERM GOALS:  Target date: 07/26/2024  patient will be independent with initial HEP and compliant with HEP 3-4 times a week  Baseline:  Goal status: in progress  2.  Patient will report 50% improvement overall  Baseline:  Goal status:in progress   LONG TERM GOALS: Target date: 08/16/2024  Patient will be independent in self management strategies to improve quality of life and functional outcomes.  Baseline:  Goal status: in progress   2.  Patient will report 70% improvement overall  Baseline:  Goal status: in progress  3.  Patient will increase cervical mobility by 30 degrees throughout to improve ability to scan for safety with driving  Baseline: see above Goal status: in progress  4.  Patient will read x 30 min without neck pain Baseline:  Goal status: in progress   5.  Patient will improve NDI score by  5 points to demonstrate improved perceived function  Baseline: 12/50 Goal status: in progress    PLAN:  PT FREQUENCY: 1x/week  PT DURATION: 6 weeks  PLANNED INTERVENTIONS: [id number]- PT Re-evaluation, 97110-Therapeutic exercises, 97530- Therapeutic activity, 97112- Neuromuscular re-education, 97535- Self Care, [id number]- Manual therapy, [id number]- Gait training, [phone]- Orthotic Fit/training, [phone]- Canalith repositioning, [id number]- Aquatic Therapy, [phone]- Splinting, [phone]- Wound care (first 20 sq cm), 97598- Wound care (each additional 20 sq cm)Patient/Family education, Balance training, Stair training, Taping, Dry Needling, Joint mobilization, Joint manipulation, Spinal manipulation, Spinal mobilization, Scar mobilization, and DME instructions.   PLAN FOR NEXT SESSION:  dry needling as appropriate; STM; cervical mobility and postural strengthening   2:20 PM, 07/27/24 Juliett Eastburn Small Mitesh Rosendahl MPT [facility] [place] [phone] Ph:[phone]

## 2024-08-03 ENCOUNTER — Ambulatory Visit (HOSPITAL_COMMUNITY)

## 2024-08-03 DIAGNOSIS — R519 Headache, unspecified: Secondary | ICD-10-CM

## 2024-08-03 DIAGNOSIS — M542 Cervicalgia: Secondary | ICD-10-CM

## 2024-08-03 DIAGNOSIS — M436 Torticollis: Secondary | ICD-10-CM

## 2024-08-03 NOTE — Therapy (Addendum)
 OUTPATIENT PHYSICAL THERAPY CERVICAL TREATMENT   Patient Name: [name] Short MRN: [id number] DOB:09-28-1945, 78 y.o., male Today's Date: 08/03/2024  END OF SESSION:  PT End of Session - 08/03/24 0816     Visit Number 3    Number of Visits 6    Date for Recertification  08/16/24    Authorization Type HTA    Authorization Time Period no auth needed    PT Start Time 0816    PT Stop Time 0856    PT Time Calculation (min) 40 min    Activity Tolerance Patient tolerated treatment well    Behavior During Therapy WFL for tasks assessed/performed          Past Medical History:  Diagnosis Date   Arthritis    ESP IN HANDS   Cancer (HCC)    OCCULAR RIGHT EYE--SURGERY AND CHEMO  PT IS LEGALLY BLIND IN RT EYE   Cataract    LEFT EYE   Chronic kidney disease    Colon cancer (HCC)    COPD (chronic obstructive pulmonary disease) (HCC)    FORMER SMOKER   Erectile dysfunction    GERD (gastroesophageal reflux disease)    Glaucoma    ONLY IN RIGHT EYE   Hypertension    Lung cancer (HCC)    lung ca dx 02/2010   Paresthesia and pain of right extremity    RIGHT SHOULDER    Pneumonia 2011   Prostate cancer (HCC)    prostate ca dx 11/11   Shortness of breath    ONLY WITH EXERTION; HX OF LOBECTOMY FOR CANER   Sleep apnea    Past Surgical History:  Procedure Laterality Date   APPENDECTOMY  02/1999   BACK SURGERY     BIOPSY  12/19/2021   Procedure: BIOPSY;  Surgeon: Golda Claudis PENNER, MD;  [facility]: [facility];  Service: Endoscopy;;   CARDIAC CATHETERIZATION  2008   indication was due to LOC, chest tightness, diaphoreses; [facility] at  [facility]; PER PATIENT , NORMAL ; no records in epic ; endoreses repeat intermittent sx since time of cath    COLONOSCOPY  08/06/2012   Procedure: COLONOSCOPY;  Surgeon: Claudis PENNER Golda, MD;  [facility]: [facility];  Service: Endoscopy;  Laterality: N/A;  955   COLONOSCOPY N/A 08/08/2015   Procedure: COLONOSCOPY;  Surgeon: Claudis PENNER Golda, MD;   [facility];  Service: Endoscopy;  Laterality: N/A;  1200   COLONOSCOPY N/A 10/21/2018   Procedure: COLONOSCOPY;  Surgeon: Golda Claudis PENNER, MD;  [facility];  Service: Endoscopy;  Laterality: N/A;  1030   COLONOSCOPY WITH PROPOFOL  N/A 12/19/2021   Procedure: COLONOSCOPY WITH PROPOFOL ;  Surgeon: Golda Claudis PENNER, MD;  [facility];  Service: Endoscopy;  Laterality: N/A;  1225   COLONOSCOPY WITH PROPOFOL  N/A 03/10/2023   Procedure: COLONOSCOPY WITH PROPOFOL ;  Surgeon: Eartha Angelia Sieving, MD;  [facility]: [facility];  Service: Gastroenterology;  Laterality: N/A;  10:30am;asa 3   ELBOW SURGERY     EYE SURGERY     OCT 1999 DETACHED RETINA -RIGHT; MAY AND JUNE 2009 RIGHT EYE SURGERY FOR CANCER-LENS HAS BEEN REMOVED.   LEFT UPPER LOBECTOMY  03/2010   FOR CANCER   PENILE PROSTHESIS IMPLANT  08/10/2012   Procedure: PENILE PROTHESIS INFLATABLE;  Surgeon: Norleen JINNY Seltzer, MD;  Location: [facility];  Service: Urology;  Laterality: N/A;  IMPLANT 3 PIECE PENILE PROSTHESIS    POLYPECTOMY  10/21/2018   Procedure: POLYPECTOMY;  Surgeon: Golda,  Claudis PENNER, MD;  [facility];  Service: Endoscopy;;  colon   POLYPECTOMY  12/19/2021   Procedure: POLYPECTOMY;  Surgeon: Golda Claudis PENNER, MD;  [facility];  Service: Endoscopy;;   POLYPECTOMY  03/10/2023   Procedure: POLYPECTOMY;  Surgeon: Eartha Angelia Sieving, MD;  [facility];  Service: Gastroenterology;;   PROSTATECTOMY  08/2010   FOR CANCER   SUBMUCOSAL LIFTING INJECTION  03/10/2023   Procedure: SUBMUCOSAL LIFTING INJECTION;  Surgeon: Eartha Angelia Sieving, MD;  [facility];  Service: Gastroenterology;;   TOTAL KNEE ARTHROPLASTY Left 09/10/2017   Procedure: LEFT TOTAL KNEE ARTHROPLASTY;  Surgeon: Duwayne Purchase, MD;  Location: [facility];  Service: Orthopedics;  Laterality: Left;  120 mins   Patient Active Problem List   Diagnosis Date Noted   Cervical spondylosis 12/09/2023    Pain in joint of left shoulder 10/26/2023   Pain in joint of right shoulder 10/26/2023   Mass of wrist 06/16/2023   History of colon cancer 03/10/2023   Cecal cancer (HCC) 01/15/2023   Colon cancer (HCC) 03/06/2022   History of colonic polyps 08/05/2018   History of total knee arthroplasty 12/08/2017   Pain in left knee 09/21/2017   Left knee DJD 09/10/2017   Preoperative cardiovascular examination 09/07/2017   Ex-smoker 09/07/2017   Essential (primary) hypertension 12/23/2012   HLD (hyperlipidemia) 12/23/2012   Primary malignant neoplasm of prostate (HCC) 12/23/2012   SCC of lung (small cell carcinoma) (HCC) 12/23/2012   Abnormal glucose level 12/23/2012   Esophagitis, reflux 12/23/2012   Glaucoma 12/23/2012   Primary cancer of eye (HCC) 12/23/2012   Organic erectile dysfunction 08/11/2012   Cancer of left lung (HCC) 04/27/2012    PCP: Sheryle Carwin, MD  REFERRING PROVIDER: Gregg Lek, MD  REFERRING DIAG: M54.2 (ICD-10-CM) - Cervicalgia G44.229 (ICD-10-CM) - Chronic tension-type headache, not intractable  THERAPY DIAG:  Neck pain  Chronic intractable headache, unspecified headache type  Neck stiffness  Rationale for Evaluation and Treatment: Rehabilitation  ONSET DATE: several months  SUBJECTIVE:                                                                                                                                                                                                         SUBJECTIVE STATEMENT: Shoulders felt better after last treatment but still feel tightness in the neck.    EVAL:Pain in neck and into upper traps and up into the back of his head; bothers him driving turning his head; had an MRI; will be having an EEG; states he did  have a siezure back in early Sept. Seems like his heart rate and blood pressure was jumping around; he may have hit his head a little bit Hand dominance: Right  PERTINENT HISTORY:  Arthritis in hands Left TKA  2019 Assists his wife with at home dialysis History of cancer 4 times; in remission  PAIN:  Are you having pain? Yes: NPRS scale: 3-4/10;  Pain location: neck, back of head and upper traps Pain description: constant Aggravating factors: sitting and looking down Relieving factors: massage, tylenol   PRECAUTIONS: None  RED FLAGS: None     WEIGHT BEARING RESTRICTIONS: No  FALLS:  Has patient fallen in last 6 months? No  OCCUPATION: retired   PLOF: Independent  PATIENT GOALS: not to have neck pain  NEXT MD VISIT: PRN  OBJECTIVE:  Note: Objective measures were completed at Evaluation unless otherwise noted.  DIAGNOSTIC FINDINGS:  STUDY DATE: 06/17/2024 PATIENT NAME: ERMON SAGAN DOB: 1945-10-06 MRN: [id number]   EXAM: MRI of the cervical spine with and without contrast   ORDERING CLINICIAN: Pastor Falling, MD CLINICAL HISTORY: 78 year old man with neck pain, tension headache and cervical radiculopathy COMPARISON FILMS: None   TECHNIQUE: MRI of the cervical spine was obtained utilizing 3 mm sagittal slices from the posterior fossa down to the T3-4 level with T1, T2 and inversion recovery views. In addition 4 mm axial slices from C2-3 down to T1-2 level were included with T2 and gradient echo views.  After the infusion contrast, additional T1-weighted images were performed. CONTRAST: 8 mL [employer] IMAGING SITE: [facility],  [place]    FINDINGS: :  On sagittal images, the spine is imaged from above the cervicomedullary junction to T3.   The visible brain appears normal.  The cervicomedullary junction of appears normal.  The spinal cord has normal caliber and signal.  The vertebral bodies are normally aligned.   The marrow shows endplate degenerative changes and some edema and minimal enhancement at C5-C6 associated with severe loss of disc height.  Otherwise, vertebral bodies have normal signal.  The atlantoaxial and atlantooccipital joints appear normal.    The discs and interspaces were further evaluated on axial views from C1 to T2 as follows:   C1-C2: This level is unremarkable.   C2-C3: There is a small right disc osteophyte complex causing mild right foraminal narrowing but no spinal stenosis or nerve root compression.   C3-C4: There is disc bulging and uncovertebral spurring and mild left facet hypertrophy causing mild to moderate left and mild right foraminal narrowing but no spinal stenosis or nerve root compression.   C4-C5: There are small disc osteophyte complexes bilaterally, mild facet hypertrophy and mild ligamenta flava hypertrophy.  This causes moderate left and mild right foraminal narrowing but no spinal stenosis or nerve root compression.   C5-C6: There is severe loss of disc height associated with endplate degenerative changes, edema and minimal enhancement.  There there is disc protrusion associated with left greater than right disc osteophyte complexes and mild ligamentum flavum hypertrophy.  This causes mild spinal stenosis, moderately severe left foraminal narrowing and moderate right foraminal narrowing.  There is potential for left C6 nerve root compression.   C6-C7: There is mild disc bulging and mild ligamenta flava hypertrophy causing minimal foraminal narrowing but no spinal stenosis or nerve root compression.   C7-T1: This level is unremarkable.   T1-T2: This level is unremarkable.       IMPRESSION:   This MRI of the cervical spine with and without  contrast shows the following: The spinal cord appears normal. At C5-C6, there is severe loss of disc height and endplate degenerative changes associated with edema and mild enhancement.  These degenerative changes could lead to neck pain.  Additionally, there is mild spinal stenosis and moderately severe left and moderate right foraminal narrowing.  There is potential for left C6 nerve root compression. There are milder degenerative changes with various degrees of  foraminal narrowing at other cervical levels not leading to spinal stenosis or nerve root compression.       INTERPRETING PHYSICIAN:  Richard A. Vear, MD, PhD, FAAN Certified in  Neuroimaging by [employer]  PATIENT SURVEYS:  NDI:  NECK DISABILITY INDEX  Date: 07/05/2024 Score                                Total 12/50; 24%   Minimum Detectable Change (90% confidence): 5 points or 10% points  COGNITION: Overall cognitive status: Within functional limits for tasks assessed  SENSATION: WFL  POSTURE: rounded shoulders and forward head  PALPATION: Tight bilateral upper traps right > left   CERVICAL ROM:   Active ROM AROM (deg) eval  Flexion 17  Extension 53  Right lateral flexion 26  Left lateral flexion 25  Right rotation 44*  Left rotation 34*   (Blank rows = not tested)  UPPER EXTREMITY ROM:  Active ROM Right eval Left eval  Shoulder flexion wfl wfl  Shoulder extension    Shoulder abduction    Shoulder adduction    Shoulder extension    Shoulder internal rotation    Shoulder external rotation    Elbow flexion    Elbow extension    Wrist flexion    Wrist extension    Wrist ulnar deviation    Wrist radial deviation    Wrist pronation    Wrist supination     (Blank rows = not tested)  UPPER EXTREMITY MMT:  MMT Right eval Left eval  Shoulder flexion 4+ 4+  Shoulder extension    Shoulder abduction    Shoulder adduction    Shoulder extension    Shoulder internal rotation    Shoulder external rotation    Middle trapezius    Lower trapezius    Elbow flexion 5 5  Elbow extension 5 5  Wrist flexion    Wrist extension    Wrist ulnar deviation    Wrist radial deviation    Wrist pronation    Wrist supination    Grip strength     (Blank rows = not tested)    TREATMENT DATE: 08/03/24 Seated moist heat to cervical spine x 5' to decrease pain and tissue tightness Seated upper trap stretch 3 x 20 each Seated  cervical rotation stretch with towel x 10 x 10 each Cervical extension with towel x 10 Manual to locate trigger points in upper traps and cervical spine Trigger Point Dry Needling  Subsequent Treatment: Instructions provided previously at initial dry needling treatment.   Patient Verbal Consent Given: Yes Education Handout Provided: Previously Provided Muscles Treated: bilateral upper trap and suboccipitals Electrical Stimulation Performed: No Treatment Response/Outcome: twitches in left upper trap x 3; loosening of muscle tissue overall     07/27/24 Review of HEP and goals with moist heat x 5' Seated upper trap stretch 5 x 10 each Seated cervical rotation stretch with towel x 5 each Manual to find trigger points Trigger Point Dry  Needling  Initial Treatment: Pt instructed on Dry Needling rational, procedures, and possible side effects. Pt instructed to expect mild to moderate muscle soreness later in the day and/or into the next day.  Pt instructed in methods to reduce muscle soreness. Pt instructed to continue prescribed HEP. Because Dry Needling was performed over or adjacent to a lung field, pt was educated on S/S of pneumothorax and to seek immediate medical attention should they occur.  Patient was educated on signs and symptoms of infection and other risk factors and advised to seek medical attention should they occur.  Patient verbalized understanding of these instructions and education.   Patient Verbal Consent Given: Yes Education Handout Provided: Previously Provided Muscles Treated:  bilateral upper traps Electrical Stimulation Performed: No Treatment Response/Outcome: multiple twitches; decreased tightness muscle  STM to cervical spine and upper traps to end treatment x 5'       07/05/24 physical therapy evaluation and HEP instruction, dry needling instructions; STM to bilateral upper traps x 5'                                                                                                                                 PATIENT EDUCATION:  Education details: Patient educated on exam findings, POC, scope of PT, HEP and dry needling instructions. Person educated: Patient Education method: Explanation, Demonstration, and Handouts Education comprehension: verbalized understanding, returned demonstration, verbal cues required, and tactile cues required  HOME EXERCISE PROGRAM: Access Code: [email or web address] URL: [email or web address] Date: 07/05/2024 Prepared by: [facility]  Exercises - Seated Gentle Upper Trapezius Stretch  - 2 x daily - 7 x weekly - 1 sets - 5 reps - 20 sec hold - Seated Assisted Cervical Rotation with Towel  - 2 x daily - 7 x weekly - 1 sets - 5 reps - 20 sec hold  ASSESSMENT:  CLINICAL IMPRESSION: Today's session with focus on improving neck mobility and decreasing pain.   Added cervical extension over towel fulcrum; added suboccipitals to dry needling today with noted feels looser comment after treatment.   Patient will benefit from continued skilled therapy services to address deficits and promote return to optimal function.       Eval: Patient is a 78 y.o. male who was seen today for physical therapy evaluation and treatment for M54.2 (ICD-10-CM) - Cervicalgia G44.229 (ICD-10-CM) - Chronic tension-type headache, not intractable. Patient demonstrates decreased strength, ROM restriction, reduced flexibility, increased tenderness to palpation and postural abnormalities which are likely contributing to symptoms of pain and are negatively impacting patient ability to perform ADLs. Patient will benefit from skilled physical therapy services to address these deficits to reduce pain and improve level of function with ADLs   OBJECTIVE IMPAIRMENTS: decreased activity tolerance, decreased mobility, decreased ROM, decreased strength, increased fascial restrictions, postural dysfunction, and pain.   ACTIVITY LIMITATIONS:  bending, sitting, reach over head, and caring for others  PARTICIPATION LIMITATIONS: meal prep and cleaning, reading  REHAB POTENTIAL: Good  CLINICAL DECISION MAKING: Evolving/moderate complexity  EVALUATION COMPLEXITY: Moderate   GOALS: Goals reviewed with patient? No  SHORT TERM GOALS: Target date: 07/26/2024  patient will be independent with initial HEP and compliant with HEP 3-4 times a week   Baseline:  Goal status: in progress  2.  Patient will report 50% improvement overall  Baseline:  Goal status:in progress   LONG TERM GOALS: Target date: 08/16/2024  Patient will be independent in self management strategies to improve quality of life and functional outcomes.  Baseline:  Goal status: in progress   2.  Patient will report 70% improvement overall  Baseline:  Goal status: in progress  3.  Patient will increase cervical mobility by 30 degrees throughout to improve ability to scan for safety with driving  Baseline: see above Goal status: in progress  4.  Patient will read x 30 min without neck pain Baseline:  Goal status: in progress   5.  Patient will improve NDI score by  5 points to demonstrate improved perceived function  Baseline: 12/50 Goal status: in progress    PLAN:  PT FREQUENCY: 1x/week  PT DURATION: 6 weeks  PLANNED INTERVENTIONS: [id number]- PT Re-evaluation, 97110-Therapeutic exercises, 97530- Therapeutic activity, 97112- Neuromuscular re-education, 97535- Self Care, [id number]- Manual therapy, [id number]- Gait training, [phone]- Orthotic Fit/training, [phone]- Canalith repositioning, [id number]- Aquatic Therapy, [phone]- Splinting, [phone]- Wound care (first 20 sq cm), 97598- Wound care (each additional 20 sq cm)Patient/Family education, Balance training, Stair training, Taping, Dry Needling, Joint mobilization, Joint manipulation, Spinal manipulation, Spinal mobilization, Scar mobilization, and DME instructions.   PLAN FOR NEXT SESSION:  dry needling as  appropriate; STM; cervical mobility and postural strengthening   9:03 AM, 08/03/24 Hawken Bielby Small [place] Viereck MPT [facility] [place] [phone] Ph:[phone]

## 2024-08-17 ENCOUNTER — Ambulatory Visit (HOSPITAL_COMMUNITY): Attending: Neurology

## 2024-08-17 DIAGNOSIS — G8929 Other chronic pain: Secondary | ICD-10-CM | POA: Insufficient documentation

## 2024-08-17 DIAGNOSIS — M436 Torticollis: Secondary | ICD-10-CM | POA: Diagnosis present

## 2024-08-17 DIAGNOSIS — R519 Headache, unspecified: Secondary | ICD-10-CM | POA: Insufficient documentation

## 2024-08-17 DIAGNOSIS — M542 Cervicalgia: Secondary | ICD-10-CM | POA: Diagnosis present

## 2024-08-17 NOTE — Therapy (Signed)
 OUTPATIENT PHYSICAL THERAPY CERVICAL TREATMENT   Patient Name: [name] Short MRN: [id number] DOB:July 29, 1946, 78 y.o., male Today's Date: 08/17/2024  END OF SESSION:  PT End of Session - 08/17/24 1334     Visit Number 4    Number of Visits 6    Date for Recertification  08/16/24    Authorization Type HTA    Authorization Time Period no auth needed    PT Start Time 1333    PT Stop Time 1413    PT Time Calculation (min) 40 min    Activity Tolerance Patient tolerated treatment well    Behavior During Therapy WFL for tasks assessed/performed          Past Medical History:  Diagnosis Date   Arthritis    ESP IN HANDS   Cancer (HCC)    OCCULAR RIGHT EYE--SURGERY AND CHEMO  PT IS LEGALLY BLIND IN RT EYE   Cataract    LEFT EYE   Chronic kidney disease    Colon cancer (HCC)    COPD (chronic obstructive pulmonary disease) (HCC)    FORMER SMOKER   Erectile dysfunction    GERD (gastroesophageal reflux disease)    Glaucoma    ONLY IN RIGHT EYE   Hypertension    Lung cancer (HCC)    lung ca dx 02/2010   Paresthesia and pain of right extremity    RIGHT SHOULDER    Pneumonia 2011   Prostate cancer (HCC)    prostate ca dx 11/11   Shortness of breath    ONLY WITH EXERTION; HX OF LOBECTOMY FOR CANER   Sleep apnea    Past Surgical History:  Procedure Laterality Date   APPENDECTOMY  02/1999   BACK SURGERY     BIOPSY  12/19/2021   Procedure: BIOPSY;  Surgeon: Golda Claudis PENNER, MD;  [facility]: [facility];  Service: Endoscopy;;   CARDIAC CATHETERIZATION  2008   indication was due to LOC, chest tightness, diaphoreses; seen at [facility]; PER PATIENT , NORMAL ; no records in epic ; endoreses repeat intermittent sx since time of cath    COLONOSCOPY  08/06/2012   Procedure: COLONOSCOPY;  Surgeon: Claudis PENNER Golda, MD;  [facility];  Service: Endoscopy;  Laterality: N/A;  955   COLONOSCOPY N/A 08/08/2015   Procedure: COLONOSCOPY;  Surgeon: Claudis PENNER Golda, MD;   [facility];  Service: Endoscopy;  Laterality: N/A;  1200   COLONOSCOPY N/A 10/21/2018   Procedure: COLONOSCOPY;  Surgeon: Golda Claudis PENNER, MD;  [facility];  Service: Endoscopy;  Laterality: N/A;  1030   COLONOSCOPY WITH PROPOFOL  N/A 12/19/2021   Procedure: COLONOSCOPY WITH PROPOFOL ;  Surgeon: Golda Claudis PENNER, MD;  [facility];  Service: Endoscopy;  Laterality: N/A;  1225   COLONOSCOPY WITH PROPOFOL  N/A 03/10/2023   Procedure: COLONOSCOPY WITH PROPOFOL ;  Surgeon: Eartha Angelia Sieving, MD;  [facility];  Service: Gastroenterology;  Laterality: N/A;  10:30am;asa 3   ELBOW SURGERY     EYE SURGERY     OCT 1999 DETACHED RETINA -RIGHT; MAY AND JUNE 2009 RIGHT EYE SURGERY FOR CANCER-LENS HAS BEEN REMOVED.   LEFT UPPER LOBECTOMY  03/2010   FOR CANCER   PENILE PROSTHESIS IMPLANT  08/10/2012   Procedure: PENILE PROTHESIS INFLATABLE;  Surgeon: Norleen JINNY Seltzer, MD;  Location: [facility];  Service: Urology;  Laterality: N/A;  IMPLANT 3 PIECE PENILE PROSTHESIS    POLYPECTOMY  10/21/2018   Procedure: POLYPECTOMY;  Surgeon: Golda,  Claudis PENNER, MD;  [facility];  Service: Endoscopy;;  colon   POLYPECTOMY  12/19/2021   Procedure: POLYPECTOMY;  Surgeon: Golda Claudis PENNER, MD;  [facility];  Service: Endoscopy;;   POLYPECTOMY  03/10/2023   Procedure: POLYPECTOMY;  Surgeon: Eartha Angelia Sieving, MD;  [facility];  Service: Gastroenterology;;   PROSTATECTOMY  08/2010   FOR CANCER   SUBMUCOSAL LIFTING INJECTION  03/10/2023   Procedure: SUBMUCOSAL LIFTING INJECTION;  Surgeon: Eartha Angelia Sieving, MD;  [facility];  Service: Gastroenterology;;   TOTAL KNEE ARTHROPLASTY Left 09/10/2017   Procedure: LEFT TOTAL KNEE ARTHROPLASTY;  Surgeon: Duwayne Purchase, MD;  Location: [facility];  Service: Orthopedics;  Laterality: Left;  120 mins   Patient Active Problem List   Diagnosis Date Noted   Cervical spondylosis 12/09/2023    Pain in joint of left shoulder 10/26/2023   Pain in joint of right shoulder 10/26/2023   Mass of wrist 06/16/2023   History of colon cancer 03/10/2023   Cecal cancer (HCC) 01/15/2023   Colon cancer (HCC) 03/06/2022   History of colonic polyps 08/05/2018   History of total knee arthroplasty 12/08/2017   Pain in left knee 09/21/2017   Left knee DJD 09/10/2017   Preoperative cardiovascular examination 09/07/2017   Ex-smoker 09/07/2017   Essential (primary) hypertension 12/23/2012   HLD (hyperlipidemia) 12/23/2012   Primary malignant neoplasm of prostate (HCC) 12/23/2012   SCC of lung (small cell carcinoma) (HCC) 12/23/2012   Abnormal glucose level 12/23/2012   Esophagitis, reflux 12/23/2012   Glaucoma 12/23/2012   Primary cancer of eye (HCC) 12/23/2012   Organic erectile dysfunction 08/11/2012   Cancer of left lung (HCC) 04/27/2012    PCP: Sheryle Carwin, MD  REFERRING PROVIDER: Gregg Lek, MD  REFERRING DIAG: M54.2 (ICD-10-CM) - Cervicalgia G44.229 (ICD-10-CM) - Chronic tension-type headache, not intractable  THERAPY DIAG:  Neck pain  Chronic intractable headache, unspecified headache type  Neck stiffness  Rationale for Evaluation and Treatment: Rehabilitation  ONSET DATE: several months  SUBJECTIVE:                                                                                                                                                                                                         SUBJECTIVE STATEMENT: Reports no significant change in his neck pain and tightness.  Shoulders are doing fine.  3/10 pain today  EVAL:Pain in neck and into upper traps and up into the back of his head; bothers him driving turning his head; had an MRI; will be having an  EEG; states he did have a siezure back in early Sept. Seems like his heart rate and blood pressure was jumping around; he may have hit his head a little bit Hand dominance: Right  PERTINENT HISTORY:  Arthritis  in hands Left TKA 2019 Assists his wife with at home dialysis History of cancer 4 times; in remission  PAIN:  Are you having pain? Yes: NPRS scale: 3-4/10;  Pain location: neck, back of head and upper traps Pain description: constant Aggravating factors: sitting and looking down Relieving factors: massage, tylenol   PRECAUTIONS: None  RED FLAGS: None     WEIGHT BEARING RESTRICTIONS: No  FALLS:  Has patient fallen in last 6 months? No  OCCUPATION: retired   PLOF: Independent  PATIENT GOALS: not to have neck pain  NEXT MD VISIT: PRN  OBJECTIVE:  Note: Objective measures were completed at Evaluation unless otherwise noted.  DIAGNOSTIC FINDINGS:  STUDY DATE: 06/17/2024 PATIENT NAME: [name] Short DOB: Sep 29, 1945 MRN: [id number]   EXAM: MRI of the cervical spine with and without contrast   ORDERING CLINICIAN: Pastor Falling, MD CLINICAL HISTORY: 78 year old man with neck pain, tension headache and cervical radiculopathy COMPARISON FILMS: None   TECHNIQUE: MRI of the cervical spine was obtained utilizing 3 mm sagittal slices from the posterior fossa down to the T3-4 level with T1, T2 and inversion recovery views. In addition 4 mm axial slices from C2-3 down to T1-2 level were included with T2 and gradient echo views.  After the infusion contrast, additional T1-weighted images were performed. CONTRAST: 8 mL Vueway  IMAGING SITE: [facility], [place]    FINDINGS: :  On sagittal images, the spine is imaged from above the cervicomedullary junction to T3.   The visible brain appears normal.  The cervicomedullary junction of appears normal.  The spinal cord has normal caliber and signal.  The vertebral bodies are normally aligned.   The marrow shows endplate degenerative changes and some edema and minimal enhancement at C5-C6 associated with severe loss of disc height.  Otherwise, vertebral bodies have normal signal.  The atlantoaxial and atlantooccipital  joints appear normal.   The discs and interspaces were further evaluated on axial views from C1 to T2 as follows:   C1-C2: This level is unremarkable.   C2-C3: There is a small right disc osteophyte complex causing mild right foraminal narrowing but no spinal stenosis or nerve root compression.   C3-C4: There is disc bulging and uncovertebral spurring and mild left facet hypertrophy causing mild to moderate left and mild right foraminal narrowing but no spinal stenosis or nerve root compression.   C4-C5: There are small disc osteophyte complexes bilaterally, mild facet hypertrophy and mild ligamenta flava hypertrophy.  This causes moderate left and mild right foraminal narrowing but no spinal stenosis or nerve root compression.   C5-C6: There is severe loss of disc height associated with endplate degenerative changes, edema and minimal enhancement.  There there is disc protrusion associated with left greater than right disc osteophyte complexes and mild ligamentum flavum hypertrophy.  This causes mild spinal stenosis, moderately severe left foraminal narrowing and moderate right foraminal narrowing.  There is potential for left C6 nerve root compression.   C6-C7: There is mild disc bulging and mild ligamenta flava hypertrophy causing minimal foraminal narrowing but no spinal stenosis or nerve root compression.   C7-T1: This level is unremarkable.   T1-T2: This level is unremarkable.       IMPRESSION:   This MRI of the cervical  spine with and without contrast shows the following: The spinal cord appears normal. At C5-C6, there is severe loss of disc height and endplate degenerative changes associated with edema and mild enhancement.  These degenerative changes could lead to neck pain.  Additionally, there is mild spinal stenosis and moderately severe left and moderate right foraminal narrowing.  There is potential for left C6 nerve root compression. There are milder degenerative changes with  various degrees of foraminal narrowing at other cervical levels not leading to spinal stenosis or nerve root compression.       INTERPRETING PHYSICIAN:  Richard A. Vear, MD, PhD, FAAN Certified in  Neuroimaging by [employer]  PATIENT SURVEYS:  NDI:  NECK DISABILITY INDEX  Date: 07/05/2024 Score                                Total 12/50; 24%   Minimum Detectable Change (90% confidence): 5 points or 10% points  COGNITION: Overall cognitive status: Within functional limits for tasks assessed  SENSATION: WFL  POSTURE: rounded shoulders and forward head  PALPATION: Tight bilateral upper traps right > left   CERVICAL ROM:   Active ROM AROM (deg) eval  Flexion 17  Extension 53  Right lateral flexion 26  Left lateral flexion 25  Right rotation 44*  Left rotation 34*   (Blank rows = not tested)  UPPER EXTREMITY ROM:  Active ROM Right eval Left eval  Shoulder flexion wfl wfl  Shoulder extension    Shoulder abduction    Shoulder adduction    Shoulder extension    Shoulder internal rotation    Shoulder external rotation    Elbow flexion    Elbow extension    Wrist flexion    Wrist extension    Wrist ulnar deviation    Wrist radial deviation    Wrist pronation    Wrist supination     (Blank rows = not tested)  UPPER EXTREMITY MMT:  MMT Right eval Left eval  Shoulder flexion 4+ 4+  Shoulder extension    Shoulder abduction    Shoulder adduction    Shoulder extension    Shoulder internal rotation    Shoulder external rotation    Middle trapezius    Lower trapezius    Elbow flexion 5 5  Elbow extension 5 5  Wrist flexion    Wrist extension    Wrist ulnar deviation    Wrist radial deviation    Wrist pronation    Wrist supination    Grip strength     (Blank rows = not tested)    TREATMENT DATE: 08/17/24 Seated moist heat to cervical spine x 5' to decrease pain and tissue tightness Seated upper trap stretch 3 x  20 each Seated cervical rotation stretch with towel x 10 x 10 each Cervical extension with towel x 10 Cervical retractions x 10 Scapular retractions 5 hold x 10 Manual to locate trigger points in upper traps and cervical spine Trigger Point Dry Needling  Subsequent Treatment: Instructions provided previously at initial dry needling treatment.   Patient Verbal Consent Given: Yes Education Handout Provided: Previously Provided Muscles Treated: bilateral upper trap and suboccipitals Electrical Stimulation Performed: No Treatment Response/Outcome: twitches in left upper trap x 3; loosening of muscle tissue overall  Updated HEP   08/03/24 Seated moist heat to cervical spine x 5' to decrease pain and tissue tightness Seated upper trap stretch 3 x  20 each Seated cervical rotation stretch with towel x 10 x 10 each Cervical extension with towel x 10 Manual to locate trigger points in upper traps and cervical spine Trigger Point Dry Needling  Subsequent Treatment: Instructions provided previously at initial dry needling treatment.   Patient Verbal Consent Given: Yes Education Handout Provided: Previously Provided Muscles Treated: bilateral upper trap and suboccipitals Electrical Stimulation Performed: No Treatment Response/Outcome: twitches in left upper trap x 3; loosening of muscle tissue overall     07/27/24 Review of HEP and goals with moist heat x 5' Seated upper trap stretch 5 x 10 each Seated cervical rotation stretch with towel x 5 each Manual to find trigger points Trigger Point Dry Needling  Initial Treatment: Pt instructed on Dry Needling rational, procedures, and possible side effects. Pt instructed to expect mild to moderate muscle soreness later in the day and/or into the next day.  Pt instructed in methods to reduce muscle soreness. Pt instructed to continue prescribed HEP. Because Dry Needling was performed over or adjacent to a lung field, pt was educated  on S/S of pneumothorax and to seek immediate medical attention should they occur.  Patient was educated on signs and symptoms of infection and other risk factors and advised to seek medical attention should they occur.  Patient verbalized understanding of these instructions and education.   Patient Verbal Consent Given: Yes Education Handout Provided: Previously Provided Muscles Treated:  bilateral upper traps Electrical Stimulation Performed: No Treatment Response/Outcome: multiple twitches; decreased tightness muscle  STM to cervical spine and upper traps to end treatment x 5'       07/05/24 physical therapy evaluation and HEP instruction, dry needling instructions; STM to bilateral upper traps x 5'                                                                                                                                PATIENT EDUCATION:  Education details: Patient educated on exam findings, POC, scope of PT, HEP and dry needling instructions. Person educated: Patient Education method: Explanation, Demonstration, and Handouts Education comprehension: verbalized understanding, returned demonstration, verbal cues required, and tactile cues required  HOME EXERCISE PROGRAM: Access Code: [email or web address] URL: [email or web address] Date: 08/17/2024 Prepared by: [facility]  Exercises - Seated Gentle Upper Trapezius Stretch  - 2 x daily - 7 x weekly - 1 sets - 5 reps - 20 sec hold - Seated Assisted Cervical Rotation with Towel  - 2 x daily - 7 x weekly - 1 sets - 5 reps - 20 sec hold - Seated Cervical Retraction  - 2 x daily - 7 x weekly - 1 sets - 10 reps - Seated Scapular Retraction  - 2 x daily - 7 x weekly - 1 sets - 10 reps - 5 sec hold - Cervical Extension AROM with Strap  - 2 x daily - 7 x weekly - 1 sets - 10 reps  Access Code: [email or web address]  URL: [email or web address] Date: 07/05/2024 Prepared by: [facility]  Exercises - Seated Gentle Upper Trapezius  Stretch  - 2 x daily - 7 x weekly - 1 sets - 5 reps - 20 sec hold - Seated Assisted Cervical Rotation with Towel  - 2 x daily - 7 x weekly - 1 sets - 5 reps - 20 sec hold  ASSESSMENT:  CLINICAL IMPRESSION: Today's session with focus on improving neck mobility and decreasing pain.   Added cervical retractions and scapular retractions today; updated HEP. Continued with dry needling with noted left upper trap tighter than right.  Updated HEP.    Patient will benefit from continued skilled therapy services to address deficits and promote return to optimal function.       Eval: Patient is a 78 y.o. male who was seen today for physical therapy evaluation and treatment for M54.2 (ICD-10-CM) - Cervicalgia G44.229 (ICD-10-CM) - Chronic tension-type headache, not intractable. Patient demonstrates decreased strength, ROM restriction, reduced flexibility, increased tenderness to palpation and postural abnormalities which are likely contributing to symptoms of pain and are negatively impacting patient ability to perform ADLs. Patient will benefit from skilled physical therapy services to address these deficits to reduce pain and improve level of function with ADLs   OBJECTIVE IMPAIRMENTS: decreased activity tolerance, decreased mobility, decreased ROM, decreased strength, increased fascial restrictions, postural dysfunction, and pain.   ACTIVITY LIMITATIONS: bending, sitting, reach over head, and caring for others  PARTICIPATION LIMITATIONS: meal prep and cleaning, reading  REHAB POTENTIAL: Good  CLINICAL DECISION MAKING: Evolving/moderate complexity  EVALUATION COMPLEXITY: Moderate   GOALS: Goals reviewed with patient? No  SHORT TERM GOALS: Target date: 07/26/2024  patient will be independent with initial HEP and compliant with HEP 3-4 times a week   Baseline:  Goal status: in progress  2.  Patient will report 50% improvement overall  Baseline:  Goal status:in progress   LONG TERM  GOALS: Target date: 08/16/2024  Patient will be independent in self management strategies to improve quality of life and functional outcomes.  Baseline:  Goal status: in progress   2.  Patient will report 70% improvement overall  Baseline:  Goal status: in progress  3.  Patient will increase cervical mobility by 30 degrees throughout to improve ability to scan for safety with driving  Baseline: see above Goal status: in progress  4.  Patient will read x 30 min without neck pain Baseline:  Goal status: in progress   5.  Patient will improve NDI score by  5 points to demonstrate improved perceived function  Baseline: 12/50 Goal status: in progress    PLAN:  PT FREQUENCY: 1x/week  PT DURATION: 6 weeks  PLANNED INTERVENTIONS: [id number]- PT Re-evaluation, 97110-Therapeutic exercises, 97530- Therapeutic activity, 97112- Neuromuscular re-education, 97535- Self Care, [id number]- Manual therapy, [id number]- Gait training, [phone]- Orthotic Fit/training, [phone]- Canalith repositioning, [id number]- Aquatic Therapy, [phone]- Splinting, [phone]- Wound care (first 20 sq cm), 97598- Wound care (each additional 20 sq cm)Patient/Family education, Balance training, Stair training, Taping, Dry Needling, Joint mobilization, Joint manipulation, Spinal manipulation, Spinal mobilization, Scar mobilization, and DME instructions.   PLAN FOR NEXT SESSION:  dry needling as appropriate; STM; cervical mobility and postural strengthening   1:58 PM, 08/17/24 Hilmar Moldovan Small Sundee Garland MPT [facility] [place] [phone] [phone]

## 2024-08-24 ENCOUNTER — Ambulatory Visit (HOSPITAL_COMMUNITY)

## 2024-08-24 DIAGNOSIS — G8929 Other chronic pain: Secondary | ICD-10-CM

## 2024-08-24 DIAGNOSIS — M436 Torticollis: Secondary | ICD-10-CM

## 2024-08-24 DIAGNOSIS — M542 Cervicalgia: Secondary | ICD-10-CM

## 2024-08-24 NOTE — Therapy (Signed)
 OUTPATIENT PHYSICAL THERAPY CERVICAL TREATMENT/PROGRESS NOTE/DISCHARGE NOTE Progress Note Reporting Period 07/05/2024 to 08/24/2024  See note below for Objective Data and Assessment of Progress/Goals.   PHYSICAL THERAPY DISCHARGE SUMMARY  Visits from Start of Care: 5  Current functional level related to goals / functional outcomes: See below   Remaining deficits: See below   Education / Equipment: HEP   Patient agrees to discharge. Patient goals were partially met. Patient is being discharged due to maximized rehab potential.        Patient Name: KASHMIR LYSAGHT MRN: [id number] DOB:[birth year], 78 y.o., male Today's Date: 08/24/2024  END OF SESSION:  PT End of Session - 08/24/24 1030     Visit Number 5    Number of Visits 6    Date for Recertification  08/16/24    Authorization Type HTA    Authorization Time Period no auth needed    PT Start Time 1030    PT Stop Time 1108    PT Time Calculation (min) 38 min    Activity Tolerance Patient tolerated treatment well    Behavior During Therapy WFL for tasks assessed/performed          Past Medical History:  Diagnosis Date   Arthritis    ESP IN HANDS   Cancer (HCC)    OCCULAR RIGHT EYE--SURGERY AND CHEMO  PT IS LEGALLY BLIND IN RT EYE   Cataract    LEFT EYE   Chronic kidney disease    Colon cancer (HCC)    COPD (chronic obstructive pulmonary disease) (HCC)    FORMER SMOKER   Erectile dysfunction    GERD (gastroesophageal reflux disease)    Glaucoma    ONLY IN RIGHT EYE   Hypertension    Lung cancer (HCC)    lung ca dx 02/2010   Paresthesia and pain of right extremity    RIGHT SHOULDER    Pneumonia 2011   Prostate cancer (HCC)    prostate ca dx 11/11   Shortness of breath    ONLY WITH EXERTION; HX OF LOBECTOMY FOR CANER   Sleep apnea    Past Surgical History:  Procedure Laterality Date   APPENDECTOMY  02/1999   BACK SURGERY     BIOPSY  12/19/2021   Procedure: BIOPSY;  Surgeon: Golda Claudis PENNER, MD;   [facility];  Service: Endoscopy;;   CARDIAC CATHETERIZATION  2008   indication was due to LOC, chest tightness, diaphoreses; seen at [place] [facility]; PER PATIENT , NORMAL ; no records in epic ; endoreses repeat intermittent sx since time of cath    COLONOSCOPY  08/06/2012   Procedure: COLONOSCOPY;  Surgeon: Claudis PENNER Golda, MD;  [facility];  Service: Endoscopy;  Laterality: N/A;  955   COLONOSCOPY N/A 08/08/2015   Procedure: COLONOSCOPY;  Surgeon: Claudis PENNER Golda, MD;  [facility];  Service: Endoscopy;  Laterality: N/A;  1200   COLONOSCOPY N/A 10/21/2018   Procedure: COLONOSCOPY;  Surgeon: Golda Claudis PENNER, MD;  [facility];  Service: Endoscopy;  Laterality: N/A;  1030   COLONOSCOPY WITH PROPOFOL  N/A 12/19/2021   Procedure: COLONOSCOPY WITH PROPOFOL ;  Surgeon: Golda Claudis PENNER, MD;  [facility];  Service: Endoscopy;  Laterality: N/A;  1225   COLONOSCOPY WITH PROPOFOL  N/A 03/10/2023   Procedure: COLONOSCOPY WITH PROPOFOL ;  Surgeon: Eartha Angelia Sieving, MD;  [facility];  Service: Gastroenterology;  Laterality: N/A;  10:30am;asa 3   ELBOW SURGERY  EYE SURGERY     OCT 1999 DETACHED RETINA -RIGHT; MAY AND JUNE 2009 RIGHT EYE SURGERY FOR CANCER-LENS HAS BEEN REMOVED.   LEFT UPPER LOBECTOMY  03/2010   FOR CANCER   PENILE PROSTHESIS IMPLANT  08/10/2012   Procedure: PENILE PROTHESIS INFLATABLE;  Surgeon: Norleen JINNY Seltzer, MD;  Location: [facility];  Service: Urology;  Laterality: N/A;  IMPLANT 3 PIECE PENILE PROSTHESIS    POLYPECTOMY  10/21/2018   Procedure: POLYPECTOMY;  Surgeon: Golda Claudis PENNER, MD;  [facility];  Service: Endoscopy;;  colon   POLYPECTOMY  12/19/2021   Procedure: POLYPECTOMY;  Surgeon: Golda Claudis PENNER, MD;  [facility];  Service: Endoscopy;;   POLYPECTOMY  03/10/2023   Procedure: POLYPECTOMY;  Surgeon: Eartha Angelia Sieving, MD;  [facility];  Service:  Gastroenterology;;   PROSTATECTOMY  08/2010   FOR CANCER   SUBMUCOSAL LIFTING INJECTION  03/10/2023   Procedure: SUBMUCOSAL LIFTING INJECTION;  Surgeon: Eartha Angelia Sieving, MD;  [facility];  Service: Gastroenterology;;   TOTAL KNEE ARTHROPLASTY Left 09/10/2017   Procedure: LEFT TOTAL KNEE ARTHROPLASTY;  Surgeon: Duwayne Purchase, MD;  Location: [facility];  Service: Orthopedics;  Laterality: Left;  120 mins   Patient Active Problem List   Diagnosis Date Noted   Cervical spondylosis 12/09/2023   Pain in joint of left shoulder 10/26/2023   Pain in joint of right shoulder 10/26/2023   Mass of wrist 06/16/2023   History of colon cancer 03/10/2023   Cecal cancer (HCC) 01/15/2023   Colon cancer (HCC) 03/06/2022   History of colonic polyps 08/05/2018   History of total knee arthroplasty 12/08/2017   Pain in left knee 09/21/2017   Left knee DJD 09/10/2017   Preoperative cardiovascular examination 09/07/2017   Ex-smoker 09/07/2017   Essential (primary) hypertension 12/23/2012   HLD (hyperlipidemia) 12/23/2012   Primary malignant neoplasm of prostate (HCC) 12/23/2012   SCC of lung (small cell carcinoma) (HCC) 12/23/2012   Abnormal glucose level 12/23/2012   Esophagitis, reflux 12/23/2012   Glaucoma 12/23/2012   Primary cancer of eye (HCC) 12/23/2012   Organic erectile dysfunction 08/11/2012   Cancer of left lung (HCC) 04/27/2012    PCP: Sheryle Carwin, MD  REFERRING PROVIDER: Gregg Lek, MD  REFERRING DIAG: M54.2 (ICD-10-CM) - Cervicalgia G44.229 (ICD-10-CM) - Chronic tension-type headache, not intractable  THERAPY DIAG:  Neck pain  Chronic intractable headache, unspecified headache type  Neck stiffness  Rationale for Evaluation and Treatment: Rehabilitation  ONSET DATE: several months  SUBJECTIVE:  SUBJECTIVE STATEMENT: Shoulders are better but neck still bothers me; maybe 20% better; still hurts to turn my head  EVAL:Pain in neck and into upper traps and up into the back of his head; bothers him driving turning his head; had an MRI; will be having an EEG; states he did have a siezure back in early Sept. Seems like his heart rate and blood pressure was jumping around; he may have hit his head a little bit Hand dominance: Right  PERTINENT HISTORY:  Arthritis in hands Left TKA 2019 Assists his wife with at home dialysis History of cancer 4 times; in remission  PAIN:  Are you having pain? Yes: NPRS scale: 3-4/10;  Pain location: neck, back of head and upper traps Pain description: constant Aggravating factors: sitting and looking down Relieving factors: massage, tylenol   PRECAUTIONS: None  RED FLAGS: None     WEIGHT BEARING RESTRICTIONS: No  FALLS:  Has patient fallen in last 6 months? No  OCCUPATION: retired   PLOF: Independent  PATIENT GOALS: not to have neck pain  NEXT MD VISIT: PRN  OBJECTIVE:  Note: Objective measures were completed at Evaluation unless otherwise noted.  DIAGNOSTIC FINDINGS:  STUDY DATE: 06/17/2024 PATIENT NAME: LEVIS NAZIR DOB: 1945-09-16 MRN: [id number]   EXAM: MRI of the cervical spine with and without contrast   ORDERING CLINICIAN: Pastor Falling, MD CLINICAL HISTORY: 78 year old man with neck pain, tension headache and cervical radiculopathy COMPARISON FILMS: None   TECHNIQUE: MRI of the cervical spine was obtained utilizing 3 mm sagittal slices from the posterior fossa down to the T3-4 level with T1, T2 and inversion recovery views. In addition 4 mm axial slices from C2-3 down to T1-2 level were included with T2 and gradient echo views.  After the infusion contrast, additional T1-weighted images were performed. CONTRAST: 8 mL Vueway  IMAGING SITE: [facility], [place]     FINDINGS: :  On sagittal images, the spine is imaged from above the cervicomedullary junction to T3.   The visible brain appears normal.  The cervicomedullary junction of appears normal.  The spinal cord has normal caliber and signal.  The vertebral bodies are normally aligned.   The marrow shows endplate degenerative changes and some edema and minimal enhancement at C5-C6 associated with severe loss of disc height.  Otherwise, vertebral bodies have normal signal.  The atlantoaxial and atlantooccipital joints appear normal.   The discs and interspaces were further evaluated on axial views from C1 to T2 as follows:   C1-C2: This level is unremarkable.   C2-C3: There is a small right disc osteophyte complex causing mild right foraminal narrowing but no spinal stenosis or nerve root compression.   C3-C4: There is disc bulging and uncovertebral spurring and mild left facet hypertrophy causing mild to moderate left and mild right foraminal narrowing but no spinal stenosis or nerve root compression.   C4-C5: There are small disc osteophyte complexes bilaterally, mild facet hypertrophy and mild ligamenta flava hypertrophy.  This causes moderate left and mild right foraminal narrowing but no spinal stenosis or nerve root compression.   C5-C6: There is severe loss of disc height associated with endplate degenerative changes, edema and minimal enhancement.  There there is disc protrusion associated with left greater than right disc osteophyte complexes and mild ligamentum flavum hypertrophy.  This causes mild spinal stenosis, moderately severe left foraminal narrowing and moderate right foraminal narrowing.  There is potential for left C6 nerve root compression.   C6-C7: There is mild  disc bulging and mild ligamenta flava hypertrophy causing minimal foraminal narrowing but no spinal stenosis or nerve root compression.   C7-T1: This level is unremarkable.   T1-T2: This level is unremarkable.        IMPRESSION:   This MRI of the cervical spine with and without contrast shows the following: The spinal cord appears normal. At C5-C6, there is severe loss of disc height and endplate degenerative changes associated with edema and mild enhancement.  These degenerative changes could lead to neck pain.  Additionally, there is mild spinal stenosis and moderately severe left and moderate right foraminal narrowing.  There is potential for left C6 nerve root compression. There are milder degenerative changes with various degrees of foraminal narrowing at other cervical levels not leading to spinal stenosis or nerve root compression.       INTERPRETING PHYSICIAN:  Richard A. Vear, MD, PhD, FAAN Certified in  Neuroimaging by [employer]  PATIENT SURVEYS:  NDI:  NECK DISABILITY INDEX  Date: 07/05/2024 Score                                Total 12/50; 24%   Minimum Detectable Change (90% confidence): 5 points or 10% points  COGNITION: Overall cognitive status: Within functional limits for tasks assessed  SENSATION: WFL  POSTURE: rounded shoulders and forward head  PALPATION: Tight bilateral upper traps right > left   CERVICAL ROM:       Active ROM AROM (deg) eval AROM 08/24/24  Flexion 17 12  Extension 53 51  Right lateral flexion 26 20  Left lateral flexion 25 27  Right rotation 44* 44  Left rotation 34* 37   (Blank rows = not tested)  UPPER EXTREMITY ROM:  Active ROM Right eval Left eval  Shoulder flexion wfl wfl  Shoulder extension    Shoulder abduction    Shoulder adduction    Shoulder extension    Shoulder internal rotation    Shoulder external rotation    Elbow flexion    Elbow extension    Wrist flexion    Wrist extension    Wrist ulnar deviation    Wrist radial deviation    Wrist pronation    Wrist supination     (Blank rows = not tested)  UPPER EXTREMITY MMT:  MMT Right eval Left eval  Shoulder flexion 4+ 4+   Shoulder extension    Shoulder abduction    Shoulder adduction    Shoulder extension    Shoulder internal rotation    Shoulder external rotation    Middle trapezius    Lower trapezius    Elbow flexion 5 5  Elbow extension 5 5  Wrist flexion    Wrist extension    Wrist ulnar deviation    Wrist radial deviation    Wrist pronation    Wrist supination    Grip strength     (Blank rows = not tested)    TREATMENT DATE: 08/24/24 Seated moist heat to cervical spine x 5' to decrease pain and tissue tightness while filling out NDI NDI 7/50 14% Seated upper trap stretch 3 x 20 each Seated cervical rotation stretch with towel x 10 x 10 each Progress note AROM of cervical spine see above Goal review  08/17/24 Seated moist heat to cervical spine x 5' to decrease pain and tissue tightness Seated upper trap stretch 3 x 20 each Seated cervical rotation stretch with  towel x 10 x 10 each Cervical extension with towel x 10 Cervical retractions x 10 Scapular retractions 5 hold x 10 Manual to locate trigger points in upper traps and cervical spine Trigger Point Dry Needling  Subsequent Treatment: Instructions provided previously at initial dry needling treatment.   Patient Verbal Consent Given: Yes Education Handout Provided: Previously Provided Muscles Treated: bilateral upper trap and suboccipitals Electrical Stimulation Performed: No Treatment Response/Outcome: twitches in left upper trap x 3; loosening of muscle tissue overall  Updated HEP   08/03/24 Seated moist heat to cervical spine x 5' to decrease pain and tissue tightness Seated upper trap stretch 3 x 20 each Seated cervical rotation stretch with towel x 10 x 10 each Cervical extension with towel x 10 Manual to locate trigger points in upper traps and cervical spine Trigger Point Dry Needling  Subsequent Treatment: Instructions provided previously at initial dry needling treatment.   Patient Verbal Consent  Given: Yes Education Handout Provided: Previously Provided Muscles Treated: bilateral upper trap and suboccipitals Electrical Stimulation Performed: No Treatment Response/Outcome: twitches in left upper trap x 3; loosening of muscle tissue overall     07/27/24 Review of HEP and goals with moist heat x 5' Seated upper trap stretch 5 x 10 each Seated cervical rotation stretch with towel x 5 each Manual to find trigger points Trigger Point Dry Needling  Initial Treatment: Pt instructed on Dry Needling rational, procedures, and possible side effects. Pt instructed to expect mild to moderate muscle soreness later in the day and/or into the next day.  Pt instructed in methods to reduce muscle soreness. Pt instructed to continue prescribed HEP. Because Dry Needling was performed over or adjacent to a lung field, pt was educated on S/S of pneumothorax and to seek immediate medical attention should they occur.  Patient was educated on signs and symptoms of infection and other risk factors and advised to seek medical attention should they occur.  Patient verbalized understanding of these instructions and education.   Patient Verbal Consent Given: Yes Education Handout Provided: Previously Provided Muscles Treated:  bilateral upper traps Electrical Stimulation Performed: No Treatment Response/Outcome: multiple twitches; decreased tightness muscle  STM to cervical spine and upper traps to end treatment x 5'       07/05/24 physical therapy evaluation and HEP instruction, dry needling instructions; STM to bilateral upper traps x 5'                                                                                                                                PATIENT EDUCATION:  Education details: Patient educated on exam findings, POC, scope of PT, HEP and dry needling instructions. Person educated: Patient Education method: Explanation, Demonstration, and Handouts Education  comprehension: verbalized understanding, returned demonstration, verbal cues required, and tactile cues required  HOME EXERCISE PROGRAM: Access Code: [email or web address] URL: [email or web address] Date: 08/17/2024 Prepared by: [facility]  Exercises - Seated Gentle Upper Trapezius Stretch  -  2 x daily - 7 x weekly - 1 sets - 5 reps - 20 sec hold - Seated Assisted Cervical Rotation with Towel  - 2 x daily - 7 x weekly - 1 sets - 5 reps - 20 sec hold - Seated Cervical Retraction  - 2 x daily - 7 x weekly - 1 sets - 10 reps - Seated Scapular Retraction  - 2 x daily - 7 x weekly - 1 sets - 10 reps - 5 sec hold - Cervical Extension AROM with Strap  - 2 x daily - 7 x weekly - 1 sets - 10 reps  Access Code: [email or web address] URL: [email or web address] Date: 07/05/2024 Prepared by: [facility]  Exercises - Seated Gentle Upper Trapezius Stretch  - 2 x daily - 7 x weekly - 1 sets - 5 reps - 20 sec hold - Seated Assisted Cervical Rotation with Towel  - 2 x daily - 7 x weekly - 1 sets - 5 reps - 20 sec hold  ASSESSMENT:  CLINICAL IMPRESSION: Progress note; improved NDI score but minimal changes in subjective complaint of pain or with AROM of cervical spine.      Eval: Patient is a 78 y.o. male who was seen today for physical therapy evaluation and treatment for M54.2 (ICD-10-CM) - Cervicalgia G44.229 (ICD-10-CM) - Chronic tension-type headache, not intractable. Patient demonstrates decreased strength, ROM restriction, reduced flexibility, increased tenderness to palpation and postural abnormalities which are likely contributing to symptoms of pain and are negatively impacting patient ability to perform ADLs. Patient will benefit from skilled physical therapy services to address these deficits to reduce pain and improve level of function with ADLs   OBJECTIVE IMPAIRMENTS: decreased activity tolerance, decreased mobility, decreased ROM, decreased strength, increased fascial restrictions,  postural dysfunction, and pain.   ACTIVITY LIMITATIONS: bending, sitting, reach over head, and caring for others  PARTICIPATION LIMITATIONS: meal prep and cleaning, reading  REHAB POTENTIAL: Good  CLINICAL DECISION MAKING: Evolving/moderate complexity  EVALUATION COMPLEXITY: Moderate   GOALS: Goals reviewed with patient? No  SHORT TERM GOALS: Target date: 07/26/2024  patient will be independent with initial HEP and compliant with HEP 3-4 times a week   Baseline:  Goal status: met  2.  Patient will report 50% improvement overall  Baseline:  Goal status:in progress   LONG TERM GOALS: Target date: 08/16/2024  Patient will be independent in self management strategies to improve quality of life and functional outcomes.  Baseline:  Goal status: in progress   2.  Patient will report 70% improvement overall  Baseline:  Goal status: in progress  3.  Patient will increase cervical mobility by 30 degrees throughout to improve ability to scan for safety with driving  Baseline: see above Goal status: in progress  4.  Patient will read x 30 min without neck pain Baseline:  Goal status: in progress   5.  Patient will improve NDI score by  5 points to demonstrate improved perceived function  Baseline: 12/50 Goal status: met    PLAN:  PT FREQUENCY: 1x/week  PT DURATION: 6 weeks  PLANNED INTERVENTIONS: 97164- PT Re-evaluation, 97110-Therapeutic exercises, 97530- Therapeutic activity, 97112- Neuromuscular re-education, 97535- Self Care, [id number]- Manual therapy, [phone]- Gait training, [phone]- Orthotic Fit/training, [phone]- Canalith repositioning, [id number]- Aquatic Therapy, [phone]- Splinting, [phone]- Wound care (first 20 sq cm), 97598- Wound care (each additional 20 sq cm)Patient/Family education, Balance training, Stair training, Taping, Dry Needling, Joint mobilization, Joint manipulation, Spinal manipulation, Spinal mobilization, Scar mobilization, and DME instructions.   PLAN  FOR NEXT SESSION:  discharge   11:07 AM, 08/24/24 Peytyn Trine Small Vashon Arch MPT [place] [facility] [place] [phone]

## 2024-08-30 ENCOUNTER — Other Ambulatory Visit: Payer: Self-pay

## 2024-08-30 DIAGNOSIS — C182 Malignant neoplasm of ascending colon: Secondary | ICD-10-CM

## 2024-08-30 DIAGNOSIS — C3492 Malignant neoplasm of unspecified part of left bronchus or lung: Secondary | ICD-10-CM

## 2024-08-30 DIAGNOSIS — C180 Malignant neoplasm of cecum: Secondary | ICD-10-CM

## 2024-08-30 NOTE — Progress Notes (Unsigned)
°   °  °  Cardiology Office Note Date:  09/01/2024  ID:  SANDEEP RADELL, DOB Feb 09, 1946, MRN [id number] PCP:  Sheryle Carwin, MD  Cardiologist:   Joelle VEAR Ren Donley, MD  Chief Complaint  Patient presents with   Coronary Artery Disease     Problems Bigeminy on ECG Ziopatch 10/25: PVC burden 4.4% TTE 11/25: 50-55% CAC on CT Tobacco use Hx Aorta ultrasound: AA 2.9, TAA 3.8 --> repeat in 2027 M: ASA81, CL25 2x/week, XL25, AN 2x/week L: LDL 96 9/25, HA1C 6.1  Visits  09/25: TTE, ziopatch 72 hrs, AAA duplex, XL25 for PVCs, LP/HA1C 12/25: EE 10 given CAC and LDL 96     History of Present Illness:  Discussed the use of AI scribe software for clinical note transcription with the patient, who gave verbal consent to proceed. [name] Short is a 78 year old male with elevated cholesterol and cardiac arrhythmia who presents for cardiovascular follow-up. He has had no chest pain, shortness of breath, or palpitations. His physical activity is light and limited to household tasks due to cold weather. He has been checking his blood pressure at home daily for the past month and it has been mostly in 120s. He takes a statin twice a week due to prior muscle cramps with more frequent dosing. His most recent cholesterol was 96. He was started on metoprolol  for extra heartbeats, and a recent 3-day monitor showed a 4% ectopy burden.  ROS: Otherwise negative  Physical Exam VS:  BP 136/62 (BP Location: Left Arm, Patient Position: Sitting, Cuff Size: Normal)   Pulse 60   Ht 5' 7 (1.702 m)   Wt 179 lb (81.2 kg)   SpO2 97%   BMI 28.04 kg/m  , BMI Body mass index is 28.04 kg/m. GEN: Well nourished, well developed, in no acute distress HEENT: normal Neck: no JVD, carotid bruits, or masses Cardiac: RRR; no murmurs, rubs, or gallops,no edema  Respiratory:  CTAB bilaterally, normal work of breathing GI: soft, nontender, nondistended, + BS Extremities: No LE edema Skin: warm and dry, no  rash Neuro:  Strength and sensation are intact  Recent Labs: Reviewed  ASSESSMENT AND PLAN [name] Short is a 78 y.o. male who presents for follow up.     Coronary artery disease No current symptoms. Blood pressure generally well-controlled. LDL 96. Previous statin intolerance.  - Continue current management and monitoring. - Continue statin regimen twice a week. - Initiated ezetimibe  daily. - Monitor cholesterol levels with PCP.  Premature ventricular contractions Medication reduced burden to 4%, indicating effective management. - Continue current medication regimen. - Monitor for new symptoms and reassess if necessary.  Essential hypertension Blood pressure generally well-controlled. Home monitoring ongoing. - Continue current antihypertensive regimen. - Continue home blood pressure monitoring.  Aortic dilation Noted on ultrasound, does not meet aneurysm criteria. - Schedule follow-up imaging in two years.      Signed, Joelle VEAR Ren Donley, MD  09/01/2024 9:07 AM    [facility]

## 2024-08-31 ENCOUNTER — Ambulatory Visit (HOSPITAL_COMMUNITY)

## 2024-08-31 ENCOUNTER — Inpatient Hospital Stay: Admitting: Hematology

## 2024-08-31 ENCOUNTER — Inpatient Hospital Stay: Attending: Nurse Practitioner

## 2024-08-31 VITALS — BP 121/63 | HR 62 | Temp 97.7°F | Resp 17 | Wt 179.8 lb

## 2024-08-31 DIAGNOSIS — C180 Malignant neoplasm of cecum: Secondary | ICD-10-CM

## 2024-08-31 DIAGNOSIS — C3492 Malignant neoplasm of unspecified part of left bronchus or lung: Secondary | ICD-10-CM

## 2024-08-31 DIAGNOSIS — Z9049 Acquired absence of other specified parts of digestive tract: Secondary | ICD-10-CM | POA: Insufficient documentation

## 2024-08-31 DIAGNOSIS — Z87891 Personal history of nicotine dependence: Secondary | ICD-10-CM | POA: Insufficient documentation

## 2024-08-31 DIAGNOSIS — C182 Malignant neoplasm of ascending colon: Secondary | ICD-10-CM

## 2024-08-31 DIAGNOSIS — Z85038 Personal history of other malignant neoplasm of large intestine: Secondary | ICD-10-CM | POA: Diagnosis present

## 2024-08-31 LAB — CBC WITH DIFFERENTIAL (CANCER CENTER ONLY)
Abs Immature Granulocytes: 0.02 K/uL (ref 0.00–0.07)
Basophils Absolute: 0.1 K/uL (ref 0.0–0.1)
Basophils Relative: 1 %
Eosinophils Absolute: 0.2 K/uL (ref 0.0–0.5)
Eosinophils Relative: 2 %
HCT: 39.1 % (ref 39.0–52.0)
Hemoglobin: 13.6 g/dL (ref 13.0–17.0)
Immature Granulocytes: 0 %
Lymphocytes Relative: 30 %
Lymphs Abs: 2 K/uL (ref 0.7–4.0)
MCH: 31.3 pg (ref 26.0–34.0)
MCHC: 34.8 g/dL (ref 30.0–36.0)
MCV: 90.1 fL (ref 80.0–100.0)
Monocytes Absolute: 0.6 K/uL (ref 0.1–1.0)
Monocytes Relative: 9 %
Neutro Abs: 3.9 K/uL (ref 1.7–7.7)
Neutrophils Relative %: 58 %
Platelet Count: 256 K/uL (ref 150–400)
RBC: 4.34 MIL/uL (ref 4.22–5.81)
RDW: 13.2 % (ref 11.5–15.5)
WBC Count: 6.8 K/uL (ref 4.0–10.5)
nRBC: 0 % (ref 0.0–0.2)

## 2024-08-31 LAB — CMP (CANCER CENTER ONLY)
ALT: 15 U/L (ref 0–44)
AST: 22 U/L (ref 15–41)
Albumin: 4.4 g/dL (ref 3.5–5.0)
Alkaline Phosphatase: 77 U/L (ref 38–126)
Anion gap: 12 (ref 5–15)
BUN: 28 mg/dL — ABNORMAL HIGH (ref 8–23)
CO2: 22 mmol/L (ref 22–32)
Calcium: 9.3 mg/dL (ref 8.9–10.3)
Chloride: 105 mmol/L (ref 98–111)
Creatinine: 1.35 mg/dL — ABNORMAL HIGH (ref 0.61–1.24)
GFR, Estimated: 54 mL/min — ABNORMAL LOW (ref >60)
Glucose, Bld: 110 mg/dL — ABNORMAL HIGH (ref 70–99)
Potassium: 4.4 mmol/L (ref 3.5–5.1)
Sodium: 139 mmol/L (ref 135–145)
Total Bilirubin: 0.5 mg/dL (ref 0.0–1.2)
Total Protein: 7.7 g/dL (ref 6.5–8.1)

## 2024-08-31 LAB — CEA (ACCESS): CEA (CHCC): 1.69 ng/mL (ref 0.00–5.00)

## 2024-08-31 NOTE — Assessment & Plan Note (Signed)
p(T3, N0) cM0 stage IIa, MSS -found to have a 3 cm cecal mass on routine colonoscopy on 12/19/21 by [name]. Biopsy revealed moderately differentiated adenocarcinoma -Staging CT AP 12/30/21 and chest 01/30/22 were both negative for metastatic disease. -baseline CEA 02/27/22 elevated to 44 -s/p right hemicolectomy by [name] 03/06/22, path 3.5 cm mass invading into pericolonic soft tissue. Margins and lymph nodes negative. MMR normal, MSI stable -postoperative CEA dropped to WNL. -Guardant Reveal from 05/22/22 and 07/2022 were negative. -surveillance CT scan 02/20/2023 showed NED

## 2024-08-31 NOTE — Progress Notes (Signed)
 [facility]   [phone] Fax:([phone]) [phone]   Clinic Follow up Note   Patient Care Team: Sheryle Carwin, MD as PCP - General (Internal Medicine) Golda Claudis PENNER, MD (Inactive) as Consulting Physician (Gastroenterology) Debby Hila, MD as Consulting Physician (General Surgery) Lanny Callander, MD as Consulting Physician (Hematology) Burton, Lacie K, NP as Nurse Practitioner (Nurse Practitioner)  Date of Service:  08/31/2024  CHIEF COMPLAINT: f/u of colon cancer   CURRENT THERAPY:  Cancer surveillance  Oncology History   Colon cancer (HCC) p(T3, N0) cM0 stage IIa, MSS -found to have a 3 cm cecal mass on routine colonoscopy on 12/19/21 by Dr. Golda. Biopsy revealed moderately differentiated adenocarcinoma -Staging CT AP 12/30/21 and chest 01/30/22 were both negative for metastatic disease. -baseline CEA 02/27/22 elevated to 44 -s/p right hemicolectomy by Dr. Debby 03/06/22, path 3.5 cm mass invading into pericolonic soft tissue. Margins and lymph nodes negative. MMR normal, MSI stable -postoperative CEA dropped to WNL. -Guardant Reveal from 05/22/22 and 07/2022 were negative. -surveillance CT scan 02/20/2023 showed NED  Assessment & Plan Stage II colon cancer He is approximately 2.5 years post-diagnosis and remains clinically well, without gastrointestinal symptoms or concerning findings. The risk of recurrence is low due to early stage at diagnosis. He has adhered to recommended surveillance and is nearing three years since diagnosis. - Ordered CT scan for surveillance in early June 2026. - Scheduled follow-up visit one week after CT scan to review results. - Planned transition to annual office visits with laboratory studies following next CT scan. - Released CT scan results to him prior to follow-up visit per his preference.   Plan - He is clinically doing well, no concern for recurrence - Follow-up in 6 months with labs and last CT scan 1 week  before    SUMMARY OF ONCOLOGIC HISTORY: Oncology History Overview Note   Cancer Staging  Cancer of left lung [facility]) Staging form: Lung, AJCC 7th Edition - Pathologic: pT2pN0cM0 - Signed by [employer] [place] NOVAK, MD on 05/27/2012 Cancer stage: pT2pN0cM0  Colon cancer [facility]) Staging form: Colon and Rectum, AJCC 8th Edition - Pathologic stage from 03/06/2022: Stage IIA (pT3, pN0, cM0) - Signed by Burton, Lacie K, NP on 04/14/2022 Stage prefix: Initial diagnosis Total positive nodes: 0 Histologic grading system: 4 grade system Histologic grade (G): G2     Colon cancer (HCC)  12/19/2021 Procedure   Colonoscopy impression, by Dr. Golda - Likely malignant tumor in the cecum. Biopsied. - One small polyp in the distal transverse colon. Biopsied. - Five small polyps in the sigmoid colon and in the descending colon. - One 5 to 10 mm polyp at the splenic flexure, removed with a cold snare. Resected and retrieved. - One 7 mm polyp in the mid sigmoid colon, removed with a cold snare. Resected and retrieved. - Diverticulosis in the sigmoid colon. - External hemorrhoids.   12/19/2021 Initial Biopsy   FINAL MICROSCOPIC DIAGNOSIS:  A. COLON, MASS, BIOPSY:  Invasive moderately differentiated adenocarcinoma  Tumor arises within ulcerated tubular adenoma with high-grade dysplasia  B. COLON, TRANSVERSE, DESCENDING, SIGMOID, POLYPECTOMY:  Tubular adenoma  Negative for high-grade dysplasia and carcinoma  C.  COLON, SPLENIC FLEXURE POLYP:  Tubular adenoma  Negative for high-grade dysplasia and carcinoma  D. COLON, DISTAL SIGMOID, POLYPECTOMY:  Tubular adenoma  Negative for high-grade dysplasia and carcinoma    12/30/2021 Imaging   CT AP IMPRESSION: No acute findings. No evidence of abdominal or pelvic metastatic disease.  Prior prostatectomy. Aortic Atherosclerosis (ICD10-I70.0).  01/30/2022 Imaging   CT chest IMPRESSION: 1. No evidence of pulmonary metastasis. 2. Coronary artery calcification  and Aortic Atherosclerosis (ICD10-I70.0).     02/27/2022 Tumor Marker   CEA: 44.1   03/06/2022 Initial Diagnosis   Colon cancer (HCC)   03/06/2022 Cancer Staging   Staging form: Colon and Rectum, AJCC 8th Edition - Pathologic stage from 03/06/2022: Stage IIA (pT3, pN0, cM0) - Signed by Ann Mayme POUR, NP on 04/14/2022 Stage prefix: Initial diagnosis Total positive nodes: 0 Histologic grading system: 4 grade system Histologic grade (G): G2   03/06/2022 Surgery   PROCEDURE:  XI ROBOT ASSISTED PARTIAL RIGHT COLECTOMY   03/06/2022 Pathology Results   FINAL MICROSCOPIC DIAGNOSIS:  A. COLON, RIGHT, RESECTION:  - Invasive moderately differentiated adenocarcinoma, 3.5 cm, involving  cecum and proximal ascending colon  - Carcinoma invades into pericolonic soft tissue  - Resection margins are negative for carcinoma  - Seventeen benign lymph nodes (0/17)  - Two separate tubular adenomas  - Unremarkable appendiceal stump  Procedure: Resection, right colon  Tumor Site: Cecum and proximal ascending colon  Tumor Size: 3.5 cm  Macroscopic Tumor Perforation: Not identified  Histologic Type: Adenocarcinoma  Histologic Grade: G2: Moderately differentiated  Multiple Primary Sites: Not applicable  Tumor Extension: Carcinoma invades into pericolonic soft tissue  Lymphovascular Invasion: Not identified  Perineural Invasion: Not identified  Treatment Effect: No known presurgical therapy  Margins:       Margin Status for Invasive Carcinoma: All margins negative for invasive carcinoma       Margin Status for Non-Invasive Tumor: All margins negative for high-grade dysplasia / intramucosal carcinoma and low-grade dysplasia  Regional Lymph Nodes:       Number of Lymph Nodes with Tumor: 0       Number of Lymph Nodes Examined: 17  Tumor Deposits: Not identified  Distant Metastasis:       Distant Site(s) Involved: Not applicable  Pathologic Stage Classification (pTNM, AJCC 8th Edition): pT3, pN0   MMR  normal, MSI-stable   02/20/2023 Imaging    IMPRESSION: 1. No evidence of metastatic disease in the chest, abdomen, or pelvis. 2. Aortic Atherosclerosis (ICD10-I70.0) and Emphysema (ICD10-J43.9).     03/10/2023 Procedure   Colonoscopy Impression: Examined ileum normal Patent end-to-end anastomosis characterized by healthy colonic mucosa. 8 mm polyp in the cecum.  Pathology showed inflammatory polyp only. 4.  Two, 3 to 4 mm polyps in the descending colon 5.  Diverticulosis 6.  Nonbleeding, internal hemorrhoids      Discussed the use of AI scribe software for clinical note transcription with the patient, who gave verbal consent to proceed.  History of Present Illness [name] Short is a 78 year old male with stage II colon cancer, status post resection, presenting for routine oncology follow-up.  He is approximately 2.5 years from diagnosis of stage II (T3N0M0) colon cancer and is asymptomatic. He has no abdominal pain, change in bowel habits, or rectal bleeding. Surveillance colonoscopy in June 2024 was unremarkable, with the next recommended in three years. He has no weight loss, fevers, chills, or other constitutional symptoms.  In the past six months he had a single brief seizure in September 2025 with transient loss of consciousness. CT and labs in the emergency setting were unrevealing, and neurology evaluation with EEG and cervical spine MRI showed only mild degenerative changes and mild canal narrowing. He has persistent neck and occipital pain despite dry needling, but no recurrent seizures or new neurologic symptoms.  Echocardiogram was normal. He  has a small, stable aortic aneurysm under surveillance and no chest pain, dyspnea, or lower extremity edema.  He lives with his wife, who is on home dialysis, and he assists with her care. He has no new symptoms, including ankle swelling or abdominal pain.     All other systems were reviewed with the patient and are  negative.  MEDICAL HISTORY:  Past Medical History:  Diagnosis Date   Arthritis    ESP IN HANDS   Cancer (HCC)    OCCULAR RIGHT EYE--SURGERY AND CHEMO  PT IS LEGALLY BLIND IN RT EYE   Cataract    LEFT EYE   Chronic kidney disease    Colon cancer (HCC)    COPD (chronic obstructive pulmonary disease) (HCC)    FORMER SMOKER   Erectile dysfunction    GERD (gastroesophageal reflux disease)    Glaucoma    ONLY IN RIGHT EYE   Hypertension    Lung cancer (HCC)    lung ca dx 02/2010   Paresthesia and pain of right extremity    RIGHT SHOULDER    Pneumonia 2011   Prostate cancer (HCC)    prostate ca dx 11/11   Shortness of breath    ONLY WITH EXERTION; HX OF LOBECTOMY FOR CANER   Sleep apnea     SURGICAL HISTORY: Past Surgical History:  Procedure Laterality Date   APPENDECTOMY  02/1999   BACK SURGERY     BIOPSY  12/19/2021   Procedure: BIOPSY;  Surgeon: Golda Claudis PENNER, MD;  [facility];  Service: Endoscopy;;   CARDIAC CATHETERIZATION  2008   indication was due to LOC, chest tightness, diaphoreses; seen at [place] [facility]; PER PATIENT , NORMAL ; no records in epic ; endoreses repeat intermittent sx since time of cath    COLONOSCOPY  08/06/2012   Procedure: COLONOSCOPY;  Surgeon: Claudis PENNER Golda, MD;  [facility];  Service: Endoscopy;  Laterality: N/A;  955   COLONOSCOPY N/A 08/08/2015   Procedure: COLONOSCOPY;  Surgeon: Claudis PENNER Golda, MD;  [facility];  Service: Endoscopy;  Laterality: N/A;  1200   COLONOSCOPY N/A 10/21/2018   Procedure: COLONOSCOPY;  Surgeon: Golda Claudis PENNER, MD;  [facility];  Service: Endoscopy;  Laterality: N/A;  1030   COLONOSCOPY WITH PROPOFOL  N/A 12/19/2021   Procedure: COLONOSCOPY WITH PROPOFOL ;  Surgeon: Golda Claudis PENNER, MD;  [facility];  Service: Endoscopy;  Laterality: N/A;  1225   COLONOSCOPY WITH PROPOFOL  N/A 03/10/2023   Procedure: COLONOSCOPY WITH PROPOFOL ;  Surgeon: Eartha Angelia Sieving, MD;  [facility];  Service: Gastroenterology;  Laterality: N/A;  10:30am;asa 3   ELBOW SURGERY     EYE SURGERY     OCT 1999 DETACHED RETINA -RIGHT; MAY AND JUNE 2009 RIGHT EYE SURGERY FOR CANCER-LENS HAS BEEN REMOVED.   LEFT UPPER LOBECTOMY  03/2010   FOR CANCER   PENILE PROSTHESIS IMPLANT  08/10/2012   Procedure: PENILE PROTHESIS INFLATABLE;  Surgeon: Norleen JINNY Seltzer, MD;  Location: [facility];  Service: Urology;  Laterality: N/A;  IMPLANT 3 PIECE PENILE PROSTHESIS    POLYPECTOMY  10/21/2018   Procedure: POLYPECTOMY;  Surgeon: Golda Claudis PENNER, MD;  [facility];  Service: Endoscopy;;  colon   POLYPECTOMY  12/19/2021   Procedure: POLYPECTOMY;  Surgeon: Golda Claudis PENNER, MD;  [facility];  Service: Endoscopy;;   POLYPECTOMY  03/10/2023   Procedure: POLYPECTOMY;  Surgeon: Eartha Angelia Sieving, MD;  [facility];  Service: Gastroenterology;;   PROSTATECTOMY  08/2010   FOR CANCER   SUBMUCOSAL LIFTING INJECTION  03/10/2023   Procedure: SUBMUCOSAL LIFTING INJECTION;  Surgeon: Eartha Angelia Sieving, MD;  [facility];  Service: Gastroenterology;;   TOTAL KNEE ARTHROPLASTY Left 09/10/2017   Procedure: LEFT TOTAL KNEE ARTHROPLASTY;  Surgeon: Duwayne Purchase, MD;  Location: [facility];  Service: Orthopedics;  Laterality: Left;  120 mins    I have reviewed the social history and family history with the patient and they are unchanged from previous note.  ALLERGIES:  is allergic to silicone.  MEDICATIONS:  Current Outpatient Medications  Medication Sig Dispense Refill   acetaminophen  (TYLENOL ) 650 MG CR tablet Take 1,300 mg by mouth every morning.     amLODipine  (NORVASC ) 5 MG tablet Take 5 mg by mouth daily.     aspirin  EC 81 MG tablet Take 1 tablet (81 mg total) by mouth daily. 30 tablet 11   atorvastatin (LIPITOR) 20 MG tablet Take 20 mg by mouth 2 (two) times a week. Takes twice a week     cefpodoxime (VANTIN) 200 MG tablet Take 200  mg by mouth 2 (two) times daily.     chlorthalidone  (HYGROTON ) 25 MG tablet Take 25 mg by mouth 2 (two) times a week. Per pt, takes only twice a week.     Cholecalciferol (VITAMIN D) 50 MCG (2000 UT) tablet Take 2,000 Units by mouth daily.     diphenhydrAMINE  (BENADRYL ) 25 mg capsule Take 25-50 mg by mouth every 6 (six) hours as needed for allergies.     guaiFENesin (MUCINEX) 600 MG 12 hr tablet Take 600 mg by mouth 2 (two) times daily.     losartan  (COZAAR ) 100 MG tablet Take 100 mg by mouth daily.     metoprolol  succinate (TOPROL  XL) 25 MG 24 hr tablet Take 1 tablet (25 mg total) by mouth daily. 90 tablet 3   Omeprazole 20 MG TBEC Take 20 mg by mouth every other day.     triamcinolone  cream (KENALOG ) 0.1 % Apply 1 application  topically daily as needed for rash.     No current facility-administered medications for this visit.    PHYSICAL EXAMINATION: ECOG PERFORMANCE STATUS: 0 - Asymptomatic  Vitals:   08/31/24 1042  BP: 121/63  Pulse: 62  Resp: 17  Temp: 97.7 F (36.5 C)  SpO2: 99%   Wt Readings from Last 3 Encounters:  08/31/24 179 lb 12.8 oz (81.6 kg)  06/08/24 175 lb 8 oz (79.6 kg)  06/06/24 173 lb (78.5 kg)     GENERAL:alert, no distress and comfortable SKIN: skin color, texture, turgor are normal, no rashes or significant lesions EYES: normal, Conjunctiva are pink and non-injected, sclera clear NECK: supple, thyroid normal size, non-tender, without nodularity LYMPH:  no palpable lymphadenopathy in the cervical, axillary  LUNGS: clear to auscultation and percussion with normal breathing effort HEART: regular rate & rhythm and no murmurs and no lower extremity edema ABDOMEN:abdomen soft, non-tender and normal bowel sounds Musculoskeletal:no cyanosis of digits and no clubbing  NEURO: alert & oriented x 3 with fluent speech, no focal motor/sensory deficits  Physical Exam ABDOMEN: Normal EXTREMITIES: No ankle swelling  LABORATORY DATA:  I have reviewed the data as  listed    Latest Ref Rng & Units 08/31/2024   10:20 AM 06/01/2024    7:44 AM 02/25/2024    3:01 PM  CBC  WBC 4.0 - 10.5 K/uL 6.8  8.3  6.6  Hemoglobin 13.0 - 17.0 g/dL 86.3  87.4  86.1   Hematocrit 39.0 - 52.0 % 39.1  36.7  38.2   Platelets 150 - 400 K/uL 256  339  276         Latest Ref Rng & Units 08/31/2024   10:20 AM 06/01/2024    7:44 AM 02/25/2024    3:01 PM  CMP  Glucose 70 - 99 mg/dL 889  873  886   BUN 8 - 23 mg/dL 28  38  36   Creatinine 0.61 - 1.24 mg/dL 8.64  8.48  8.45   Sodium 135 - 145 mmol/L 139  139  137   Potassium 3.5 - 5.1 mmol/L 4.4  3.9  4.2   Chloride 98 - 111 mmol/L 105  105  104   CO2 22 - 32 mmol/L 22  19  21    Calcium 8.9 - 10.3 mg/dL 9.3  8.7  9.2   Total Protein 6.5 - 8.1 g/dL 7.7  7.6  7.9   Total Bilirubin 0.0 - 1.2 mg/dL 0.5  0.9  0.7   Alkaline Phos 38 - 126 U/L 77  70  65   AST 15 - 41 U/L 22  16  20    ALT 0 - 44 U/L 15  15  18        RADIOGRAPHIC STUDIES: I have personally reviewed the radiological images as listed and agreed with the findings in the report. No results found.    Orders Placed This Encounter  Procedures   CT CHEST ABDOMEN PELVIS W CONTRAST    Standing Status:   Future    Expected Date:   02/22/2025    Expiration Date:   08/31/2025    If indicated for the ordered procedure, I authorize the administration of contrast media per Radiology protocol:   Yes    Does the patient have a contrast media/X-ray dye allergy?:   No    Preferred imaging location?:   [facility]    If indicated for the ordered procedure, I authorize the administration of oral contrast media per Radiology protocol:   Yes   All questions were answered. The patient knows to call the clinic with any problems, questions or concerns. No barriers to learning was detected. The total time spent in the appointment was 25 minutes, including review of chart and various tests results, discussions about plan of care and coordination of care plan     Onita Mattock, MD 08/31/2024

## 2024-09-01 ENCOUNTER — Ambulatory Visit

## 2024-09-01 VITALS — BP 136/62 | HR 60 | Ht 67.0 in | Wt 179.0 lb

## 2024-09-01 DIAGNOSIS — I2510 Atherosclerotic heart disease of native coronary artery without angina pectoris: Secondary | ICD-10-CM

## 2024-09-01 DIAGNOSIS — E785 Hyperlipidemia, unspecified: Secondary | ICD-10-CM | POA: Diagnosis not present

## 2024-09-01 DIAGNOSIS — Z87891 Personal history of nicotine dependence: Secondary | ICD-10-CM

## 2024-09-01 DIAGNOSIS — I10 Essential (primary) hypertension: Secondary | ICD-10-CM

## 2024-09-01 MED ORDER — EZETIMIBE 10 MG PO TABS: 10.0000 mg | ORAL_TABLET | Freq: Every day | ORAL | 3 refills | Status: AC

## 2024-09-01 NOTE — Patient Instructions (Signed)
 Medication Instructions:   START EZETIMIBE  10 MG ONCE DAILY  *If you need a refill on your cardiac medications before your next appointment, please call your pharmacy*  Follow-Up: At [facility], you and your health needs are our priority.  As part of our continuing mission to provide you with exceptional heart care, our providers are all part of one team.  This team includes your primary Cardiologist (physician) and Advanced Practice Providers or APPs (Physician Assistants and Nurse Practitioners) who all work together to provide you with the care you need, when you need it.  Your next appointment:   12 month(s)  Provider:   DR REN

## 2024-09-07 ENCOUNTER — Ambulatory Visit (HOSPITAL_COMMUNITY)

## 2025-01-11 ENCOUNTER — Ambulatory Visit: Admitting: Neurology

## 2025-02-22 ENCOUNTER — Inpatient Hospital Stay

## 2025-03-01 ENCOUNTER — Inpatient Hospital Stay: Admitting: Hematology
